# Patient Record
Sex: Female | Born: 1960 | Race: Black or African American | Hispanic: No | Marital: Single | Smoking: Never smoker
Health system: Southern US, Community
[De-identification: ages and names within clinical notes are randomized; demographics above are authoritative.]

## PROBLEM LIST (undated history)

## (undated) DIAGNOSIS — G4700 Insomnia, unspecified: Secondary | ICD-10-CM

## (undated) DIAGNOSIS — K5909 Other constipation: Secondary | ICD-10-CM

## (undated) DIAGNOSIS — E785 Hyperlipidemia, unspecified: Secondary | ICD-10-CM

## (undated) DIAGNOSIS — F419 Anxiety disorder, unspecified: Secondary | ICD-10-CM

## (undated) DIAGNOSIS — M069 Rheumatoid arthritis, unspecified: Secondary | ICD-10-CM

## (undated) DIAGNOSIS — M810 Age-related osteoporosis without current pathological fracture: Secondary | ICD-10-CM

## (undated) DIAGNOSIS — K219 Gastro-esophageal reflux disease without esophagitis: Secondary | ICD-10-CM

## (undated) DIAGNOSIS — K649 Unspecified hemorrhoids: Secondary | ICD-10-CM

## (undated) HISTORY — PX: OTHER SURGICAL HISTORY: SHX169

## (undated) HISTORY — PX: ABDOMINAL HYSTERECTOMY: SHX81

## (undated) HISTORY — DX: Unspecified hemorrhoids: K64.9

## (undated) HISTORY — DX: Insomnia, unspecified: G47.00

## (undated) HISTORY — DX: Anxiety disorder, unspecified: F41.9

## (undated) HISTORY — DX: Hyperlipidemia, unspecified: E78.5

## (undated) HISTORY — PX: KNEE ARTHROSCOPY: SUR90

## (undated) HISTORY — DX: Other constipation: K59.09

## (undated) HISTORY — DX: Gastro-esophageal reflux disease without esophagitis: K21.9

---

## 2002-02-04 ENCOUNTER — Ambulatory Visit (HOSPITAL_COMMUNITY): Admission: RE | Admit: 2002-02-04 | Discharge: 2002-02-04 | Payer: Self-pay | Admitting: Internal Medicine

## 2002-03-13 ENCOUNTER — Ambulatory Visit (HOSPITAL_COMMUNITY): Admission: RE | Admit: 2002-03-13 | Discharge: 2002-03-13 | Payer: Self-pay | Admitting: Internal Medicine

## 2002-04-19 ENCOUNTER — Encounter (INDEPENDENT_AMBULATORY_CARE_PROVIDER_SITE_OTHER): Payer: Self-pay | Admitting: Internal Medicine

## 2002-04-19 ENCOUNTER — Ambulatory Visit (HOSPITAL_COMMUNITY): Admission: RE | Admit: 2002-04-19 | Discharge: 2002-04-19 | Payer: Self-pay | Admitting: Internal Medicine

## 2002-05-03 ENCOUNTER — Encounter: Payer: Self-pay | Admitting: Emergency Medicine

## 2002-05-03 ENCOUNTER — Emergency Department (HOSPITAL_COMMUNITY): Admission: EM | Admit: 2002-05-03 | Discharge: 2002-05-03 | Payer: Self-pay | Admitting: Emergency Medicine

## 2002-12-19 ENCOUNTER — Emergency Department (HOSPITAL_COMMUNITY): Admission: EM | Admit: 2002-12-19 | Discharge: 2002-12-19 | Payer: Self-pay | Admitting: Emergency Medicine

## 2002-12-26 ENCOUNTER — Ambulatory Visit (HOSPITAL_COMMUNITY): Admission: RE | Admit: 2002-12-26 | Discharge: 2002-12-26 | Payer: Self-pay | Admitting: Family Medicine

## 2003-02-20 ENCOUNTER — Ambulatory Visit (HOSPITAL_COMMUNITY): Admission: RE | Admit: 2003-02-20 | Discharge: 2003-02-20 | Payer: Self-pay | Admitting: Otolaryngology

## 2003-02-20 ENCOUNTER — Encounter: Payer: Self-pay | Admitting: Otolaryngology

## 2003-04-23 ENCOUNTER — Emergency Department (HOSPITAL_COMMUNITY): Admission: EM | Admit: 2003-04-23 | Discharge: 2003-04-23 | Payer: Self-pay | Admitting: Emergency Medicine

## 2003-11-09 ENCOUNTER — Inpatient Hospital Stay (HOSPITAL_COMMUNITY): Admission: EM | Admit: 2003-11-09 | Discharge: 2003-11-11 | Payer: Self-pay | Admitting: Emergency Medicine

## 2003-12-10 ENCOUNTER — Ambulatory Visit (HOSPITAL_COMMUNITY): Admission: RE | Admit: 2003-12-10 | Discharge: 2003-12-10 | Payer: Self-pay | Admitting: Internal Medicine

## 2003-12-15 ENCOUNTER — Ambulatory Visit (HOSPITAL_COMMUNITY): Admission: RE | Admit: 2003-12-15 | Discharge: 2003-12-15 | Payer: Self-pay | Admitting: Internal Medicine

## 2003-12-30 ENCOUNTER — Ambulatory Visit (HOSPITAL_COMMUNITY): Admission: RE | Admit: 2003-12-30 | Discharge: 2003-12-30 | Payer: Self-pay | Admitting: Pulmonary Disease

## 2003-12-31 ENCOUNTER — Ambulatory Visit (HOSPITAL_COMMUNITY): Admission: RE | Admit: 2003-12-31 | Discharge: 2003-12-31 | Payer: Self-pay | Admitting: Family Medicine

## 2004-05-07 ENCOUNTER — Ambulatory Visit (HOSPITAL_COMMUNITY): Admission: RE | Admit: 2004-05-07 | Discharge: 2004-05-07 | Payer: Self-pay | Admitting: Pulmonary Disease

## 2004-06-11 ENCOUNTER — Emergency Department (HOSPITAL_COMMUNITY): Admission: EM | Admit: 2004-06-11 | Discharge: 2004-06-11 | Payer: Self-pay | Admitting: Emergency Medicine

## 2004-12-05 DIAGNOSIS — K649 Unspecified hemorrhoids: Secondary | ICD-10-CM

## 2004-12-05 HISTORY — DX: Unspecified hemorrhoids: K64.9

## 2005-01-06 ENCOUNTER — Ambulatory Visit (HOSPITAL_COMMUNITY): Admission: RE | Admit: 2005-01-06 | Discharge: 2005-01-06 | Payer: Self-pay | Admitting: Pulmonary Disease

## 2005-01-13 ENCOUNTER — Ambulatory Visit: Payer: Self-pay | Admitting: Internal Medicine

## 2005-01-20 ENCOUNTER — Ambulatory Visit: Payer: Self-pay | Admitting: Internal Medicine

## 2005-01-20 ENCOUNTER — Ambulatory Visit (HOSPITAL_COMMUNITY): Admission: RE | Admit: 2005-01-20 | Discharge: 2005-01-20 | Payer: Self-pay | Admitting: Internal Medicine

## 2005-01-20 HISTORY — PX: ESOPHAGOGASTRODUODENOSCOPY: SHX1529

## 2005-05-31 ENCOUNTER — Emergency Department (HOSPITAL_COMMUNITY): Admission: EM | Admit: 2005-05-31 | Discharge: 2005-05-31 | Payer: Self-pay | Admitting: Emergency Medicine

## 2006-01-23 ENCOUNTER — Ambulatory Visit: Payer: Self-pay | Admitting: Orthopedic Surgery

## 2006-02-15 ENCOUNTER — Emergency Department (HOSPITAL_COMMUNITY): Admission: EM | Admit: 2006-02-15 | Discharge: 2006-02-15 | Payer: Self-pay | Admitting: Emergency Medicine

## 2006-02-20 ENCOUNTER — Ambulatory Visit: Payer: Self-pay | Admitting: Orthopedic Surgery

## 2006-04-20 ENCOUNTER — Ambulatory Visit: Payer: Self-pay | Admitting: Orthopedic Surgery

## 2006-05-04 ENCOUNTER — Ambulatory Visit (HOSPITAL_COMMUNITY): Admission: RE | Admit: 2006-05-04 | Discharge: 2006-05-04 | Payer: Self-pay | Admitting: Pulmonary Disease

## 2006-05-05 ENCOUNTER — Ambulatory Visit (HOSPITAL_COMMUNITY): Admission: RE | Admit: 2006-05-05 | Discharge: 2006-05-05 | Payer: Self-pay | Admitting: Orthopedic Surgery

## 2006-05-05 ENCOUNTER — Ambulatory Visit: Payer: Self-pay | Admitting: Orthopedic Surgery

## 2006-05-08 ENCOUNTER — Ambulatory Visit: Payer: Self-pay | Admitting: Orthopedic Surgery

## 2006-05-17 ENCOUNTER — Ambulatory Visit: Payer: Self-pay | Admitting: Orthopedic Surgery

## 2006-05-22 ENCOUNTER — Ambulatory Visit: Payer: Self-pay | Admitting: Orthopedic Surgery

## 2006-06-05 ENCOUNTER — Ambulatory Visit: Payer: Self-pay | Admitting: Orthopedic Surgery

## 2006-06-05 ENCOUNTER — Encounter (HOSPITAL_COMMUNITY): Admission: RE | Admit: 2006-06-05 | Discharge: 2006-07-05 | Payer: Self-pay | Admitting: Orthopedic Surgery

## 2006-06-19 ENCOUNTER — Ambulatory Visit: Payer: Self-pay | Admitting: Orthopedic Surgery

## 2006-07-07 ENCOUNTER — Ambulatory Visit: Payer: Self-pay | Admitting: Internal Medicine

## 2007-01-16 ENCOUNTER — Ambulatory Visit: Payer: Self-pay | Admitting: Internal Medicine

## 2007-01-20 ENCOUNTER — Emergency Department (HOSPITAL_COMMUNITY): Admission: EM | Admit: 2007-01-20 | Discharge: 2007-01-20 | Payer: Self-pay | Admitting: Emergency Medicine

## 2007-03-26 ENCOUNTER — Ambulatory Visit: Payer: Self-pay | Admitting: Orthopedic Surgery

## 2007-07-02 ENCOUNTER — Ambulatory Visit: Payer: Self-pay | Admitting: Orthopedic Surgery

## 2007-12-24 ENCOUNTER — Ambulatory Visit: Payer: Self-pay | Admitting: Gastroenterology

## 2008-05-12 ENCOUNTER — Ambulatory Visit: Payer: Self-pay | Admitting: Internal Medicine

## 2008-05-19 ENCOUNTER — Ambulatory Visit: Payer: Self-pay | Admitting: Internal Medicine

## 2008-05-19 ENCOUNTER — Ambulatory Visit (HOSPITAL_COMMUNITY): Admission: RE | Admit: 2008-05-19 | Discharge: 2008-05-19 | Payer: Self-pay | Admitting: Internal Medicine

## 2008-05-19 HISTORY — PX: COLONOSCOPY: SHX174

## 2008-06-13 ENCOUNTER — Ambulatory Visit: Payer: Self-pay | Admitting: Gastroenterology

## 2008-06-30 ENCOUNTER — Ambulatory Visit (HOSPITAL_COMMUNITY): Admission: RE | Admit: 2008-06-30 | Discharge: 2008-06-30 | Payer: Self-pay | Admitting: General Surgery

## 2008-06-30 ENCOUNTER — Encounter (INDEPENDENT_AMBULATORY_CARE_PROVIDER_SITE_OTHER): Payer: Self-pay | Admitting: General Surgery

## 2008-06-30 HISTORY — PX: HEMORRHOID SURGERY: SHX153

## 2008-08-13 DIAGNOSIS — R634 Abnormal weight loss: Secondary | ICD-10-CM

## 2008-08-13 DIAGNOSIS — K219 Gastro-esophageal reflux disease without esophagitis: Secondary | ICD-10-CM | POA: Insufficient documentation

## 2008-08-13 DIAGNOSIS — F489 Nonpsychotic mental disorder, unspecified: Secondary | ICD-10-CM | POA: Insufficient documentation

## 2008-08-13 DIAGNOSIS — R110 Nausea: Secondary | ICD-10-CM

## 2008-08-13 DIAGNOSIS — Z8719 Personal history of other diseases of the digestive system: Secondary | ICD-10-CM

## 2008-08-13 DIAGNOSIS — K649 Unspecified hemorrhoids: Secondary | ICD-10-CM | POA: Insufficient documentation

## 2008-08-13 DIAGNOSIS — F411 Generalized anxiety disorder: Secondary | ICD-10-CM | POA: Insufficient documentation

## 2008-08-13 DIAGNOSIS — R1310 Dysphagia, unspecified: Secondary | ICD-10-CM | POA: Insufficient documentation

## 2008-08-13 DIAGNOSIS — K5909 Other constipation: Secondary | ICD-10-CM | POA: Insufficient documentation

## 2008-12-23 ENCOUNTER — Emergency Department (HOSPITAL_COMMUNITY): Admission: EM | Admit: 2008-12-23 | Discharge: 2008-12-24 | Payer: Self-pay | Admitting: Emergency Medicine

## 2009-01-15 ENCOUNTER — Ambulatory Visit: Payer: Self-pay | Admitting: Orthopedic Surgery

## 2009-01-15 DIAGNOSIS — M1710 Unilateral primary osteoarthritis, unspecified knee: Secondary | ICD-10-CM

## 2009-01-15 DIAGNOSIS — M25469 Effusion, unspecified knee: Secondary | ICD-10-CM

## 2009-01-15 DIAGNOSIS — M25569 Pain in unspecified knee: Secondary | ICD-10-CM

## 2009-01-29 ENCOUNTER — Ambulatory Visit: Payer: Self-pay | Admitting: Orthopedic Surgery

## 2009-01-29 DIAGNOSIS — M5430 Sciatica, unspecified side: Secondary | ICD-10-CM

## 2009-01-29 DIAGNOSIS — M549 Dorsalgia, unspecified: Secondary | ICD-10-CM | POA: Insufficient documentation

## 2009-02-03 ENCOUNTER — Telehealth: Payer: Self-pay | Admitting: Orthopedic Surgery

## 2009-02-03 ENCOUNTER — Ambulatory Visit (HOSPITAL_COMMUNITY): Admission: RE | Admit: 2009-02-03 | Discharge: 2009-02-03 | Payer: Self-pay | Admitting: Internal Medicine

## 2009-02-04 ENCOUNTER — Encounter (HOSPITAL_COMMUNITY): Admission: RE | Admit: 2009-02-04 | Discharge: 2009-03-03 | Payer: Self-pay | Admitting: Orthopedic Surgery

## 2009-02-04 ENCOUNTER — Encounter: Payer: Self-pay | Admitting: Orthopedic Surgery

## 2009-02-09 ENCOUNTER — Ambulatory Visit: Payer: Self-pay | Admitting: Orthopedic Surgery

## 2009-02-09 DIAGNOSIS — S83259A Bucket-handle tear of lateral meniscus, current injury, unspecified knee, initial encounter: Secondary | ICD-10-CM | POA: Insufficient documentation

## 2009-02-19 ENCOUNTER — Encounter: Payer: Self-pay | Admitting: Orthopedic Surgery

## 2009-02-19 ENCOUNTER — Telehealth: Payer: Self-pay | Admitting: Orthopedic Surgery

## 2009-03-10 ENCOUNTER — Ambulatory Visit (HOSPITAL_COMMUNITY): Admission: RE | Admit: 2009-03-10 | Discharge: 2009-03-10 | Payer: Self-pay | Admitting: Family Medicine

## 2009-03-11 ENCOUNTER — Encounter: Payer: Self-pay | Admitting: Orthopedic Surgery

## 2009-03-13 ENCOUNTER — Ambulatory Visit (HOSPITAL_COMMUNITY): Admission: RE | Admit: 2009-03-13 | Discharge: 2009-03-13 | Payer: Self-pay | Admitting: Orthopedic Surgery

## 2009-03-13 ENCOUNTER — Ambulatory Visit: Payer: Self-pay | Admitting: Orthopedic Surgery

## 2009-03-17 ENCOUNTER — Ambulatory Visit: Payer: Self-pay | Admitting: Orthopedic Surgery

## 2009-03-23 ENCOUNTER — Encounter: Payer: Self-pay | Admitting: Orthopedic Surgery

## 2009-03-23 ENCOUNTER — Encounter (HOSPITAL_COMMUNITY): Admission: RE | Admit: 2009-03-23 | Discharge: 2009-05-05 | Payer: Self-pay | Admitting: Orthopedic Surgery

## 2009-03-27 ENCOUNTER — Ambulatory Visit: Payer: Self-pay | Admitting: Orthopedic Surgery

## 2009-04-08 ENCOUNTER — Encounter (INDEPENDENT_AMBULATORY_CARE_PROVIDER_SITE_OTHER): Payer: Self-pay | Admitting: *Deleted

## 2009-04-08 ENCOUNTER — Ambulatory Visit: Payer: Self-pay | Admitting: Orthopedic Surgery

## 2009-04-08 DIAGNOSIS — M2240 Chondromalacia patellae, unspecified knee: Secondary | ICD-10-CM

## 2009-04-23 ENCOUNTER — Telehealth: Payer: Self-pay | Admitting: Orthopedic Surgery

## 2009-04-27 ENCOUNTER — Encounter (INDEPENDENT_AMBULATORY_CARE_PROVIDER_SITE_OTHER): Payer: Self-pay | Admitting: *Deleted

## 2009-04-27 ENCOUNTER — Ambulatory Visit: Payer: Self-pay | Admitting: Orthopedic Surgery

## 2009-05-05 ENCOUNTER — Encounter: Payer: Self-pay | Admitting: Orthopedic Surgery

## 2009-05-06 ENCOUNTER — Ambulatory Visit: Payer: Self-pay | Admitting: Orthopedic Surgery

## 2009-05-06 ENCOUNTER — Encounter (INDEPENDENT_AMBULATORY_CARE_PROVIDER_SITE_OTHER): Payer: Self-pay | Admitting: *Deleted

## 2009-05-11 ENCOUNTER — Ambulatory Visit: Payer: Self-pay | Admitting: Orthopedic Surgery

## 2009-07-29 ENCOUNTER — Ambulatory Visit: Payer: Self-pay | Admitting: Orthopedic Surgery

## 2009-09-07 ENCOUNTER — Ambulatory Visit (HOSPITAL_COMMUNITY): Admission: RE | Admit: 2009-09-07 | Discharge: 2009-09-07 | Payer: Self-pay | Admitting: Pulmonary Disease

## 2009-09-18 ENCOUNTER — Ambulatory Visit (HOSPITAL_COMMUNITY): Admission: RE | Admit: 2009-09-18 | Discharge: 2009-09-18 | Payer: Self-pay | Admitting: Pulmonary Disease

## 2009-11-23 ENCOUNTER — Ambulatory Visit: Payer: Self-pay | Admitting: Orthopedic Surgery

## 2010-03-02 ENCOUNTER — Ambulatory Visit (HOSPITAL_COMMUNITY): Admission: RE | Admit: 2010-03-02 | Discharge: 2010-03-02 | Payer: Self-pay | Admitting: Family Medicine

## 2010-03-08 ENCOUNTER — Encounter (INDEPENDENT_AMBULATORY_CARE_PROVIDER_SITE_OTHER): Payer: Self-pay

## 2010-03-09 ENCOUNTER — Encounter (HOSPITAL_COMMUNITY): Admission: RE | Admit: 2010-03-09 | Discharge: 2010-04-08 | Payer: Self-pay | Admitting: Cardiology

## 2010-05-17 ENCOUNTER — Ambulatory Visit: Payer: Self-pay | Admitting: Orthopedic Surgery

## 2010-05-18 ENCOUNTER — Ambulatory Visit (HOSPITAL_COMMUNITY): Admission: RE | Admit: 2010-05-18 | Discharge: 2010-05-18 | Payer: Self-pay | Admitting: Orthopedic Surgery

## 2010-05-25 ENCOUNTER — Encounter: Payer: Self-pay | Admitting: Orthopedic Surgery

## 2010-05-31 ENCOUNTER — Ambulatory Visit: Payer: Self-pay | Admitting: Orthopedic Surgery

## 2010-06-02 ENCOUNTER — Encounter: Payer: Self-pay | Admitting: Orthopedic Surgery

## 2010-06-02 ENCOUNTER — Encounter (HOSPITAL_COMMUNITY): Admission: RE | Admit: 2010-06-02 | Discharge: 2010-07-02 | Payer: Self-pay | Admitting: Orthopedic Surgery

## 2010-06-15 ENCOUNTER — Encounter: Payer: Self-pay | Admitting: Orthopedic Surgery

## 2010-07-28 ENCOUNTER — Encounter: Payer: Self-pay | Admitting: Orthopedic Surgery

## 2010-10-12 ENCOUNTER — Ambulatory Visit (HOSPITAL_COMMUNITY): Admission: RE | Admit: 2010-10-12 | Discharge: 2010-10-12 | Payer: Self-pay | Admitting: Pulmonary Disease

## 2010-11-10 ENCOUNTER — Ambulatory Visit (HOSPITAL_COMMUNITY)
Admission: RE | Admit: 2010-11-10 | Discharge: 2010-11-10 | Payer: Self-pay | Source: Home / Self Care | Admitting: Internal Medicine

## 2010-12-13 ENCOUNTER — Ambulatory Visit: Admit: 2010-12-13 | Payer: Self-pay | Admitting: Gastroenterology

## 2011-01-04 NOTE — Assessment & Plan Note (Signed)
Summary: RE-CK/XRAY RT KNEE/UMR/CAF   Visit Type:  Follow-up  CC:  right knee.  History of Present Illness: I saw Nicole Gilbert in the office today for a 6 month followup visit.  She is a 50 years old woman with the complaint of:  right knee stffness after sitting   DOS March 13, 2009 RIGHT knee arthroscopy/OPERATIVE FINDINGS:  She did have a lateral meniscus tear and what looked like a discoid lateral meniscus.  She also had a grade 3 lesion of the lateral femoral condyle and a grade 2 lesion of patellofemoral joint with chondromalacia.    Medication Ibuprofen as needed, helps some.  c/o back painwith constant radiating RIGHT leg pain and RIGHT leg weakness   Allergies: No Known Drug Allergies  Past History:  Past Medical History: Last updated: 01/15/2009 anxiety  Past Surgical History: Last updated: 01/15/2009  C-SECTION X 2 HYSTERECTOMY HEMORRHOIDECTOMY carpal tunnel right hand  Social History: Last updated: 01/15/2009 Patient is divorced.  nursing assistant  Risk Factors: Caffeine Use: 2 (01/15/2009)  Risk Factors: Smoking Status: never (01/15/2009)  Review of Systems Gastrointestinal:  Denies diarrhea and constipation. Genitourinary:  Denies frequency, urgency, and difficulty urinating. Neurologic:  See HPI. Musculoskeletal:  See HPI.  Physical Exam  Additional Exam:  overall appearance is normal her body habitus is mesomorphic.  She is oriented x3 her mood and affect are normal.  Her gait and station are abnormal with abnormal pelvic heights.  Her knee looks pretty good white female no effusion minimal tenderness normal range of motion stability and strength are normal.  Skin incision from the arthroscopy showed normally  Normal pulse and temperature and no edema in her lower extremities RIGHT or LEFT.  Normal sensation in both lower extremities.  Normal deep tendon reflexes bilaterally.  Normal coordination balance.  Tenderness in the lower back and  also on the RIGHT gluteal region of the hip.  She has a mild positive straight leg raise in a seated position.     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  Recommend Synvisc injections in the RIGHT knee after she has read the [employer] supplementation handout.  Recommend MRI lumbar spine to evaluate radicular symptoms.  She can start some Neurontin at night.  Orders: Est. Patient Level IV (09811)  Problem # 2:  BUCKET HANDLE TEAR OF LATERAL MENISCUS (ICD-717.41) Assessment: Unchanged  Orders: Est. Patient Level IV (91478)  Problem # 3:  SCIATICA (ICD-724.3) Assessment: Deteriorated  Orders: Est. Patient Level IV (29562) Lumbosacral Spine ,2/3 views (72100)  Problem # 4:  BACK PAIN (ICD-724.5) Assessment: Deteriorated  Orders: Est. Patient Level IV (13086) Lumbosacral Spine ,2/3 views (72100)  Problem # 5:  KNEE, ARTHRITIS, DEGEN./OSTEO (VHQ-469.62) Assessment: Unchanged  Orders: Est. Patient Level IV (95284)  Patient Instructions: 1)  Synvisc injection right knee  2)  MRI L spine 3)  Rec Neurontin 100 mg hs for right leg pain  4)  f/u after MRI

## 2011-01-04 NOTE — Letter (Signed)
Summary: MRI Order L spine  MRI Order L spine   Imported By: Cammie Sickle 05/17/2010 09:48:58  _____________________________________________________________________  External Attachment:    Type:   Image     Comment:   External Document

## 2011-01-04 NOTE — Assessment & Plan Note (Signed)
Summary: RE-CK/FOL/UP MRI RESULTS L-SPINE/UMR/CAF   Visit Type:  Follow-up  CC:  low back pain.  History of Present Illness: 50 year old female had back pain after knee surgery so we got an MRI because of persistent symptoms  Today for MRI results from 05/18/10 [facility] L spine.  Neurontin 100 at hs for back, helps some.  Has been out of work for a week.  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  No nerve root compression.  MRI shows no operable disease or need for epidurals.  I think she needs a good physical therapy program    Allergies: No Known Drug Allergies   Impression & Recommendations:  Problem # 1:  SCIATICA (ICD-724.3) Assessment Improved  back pain improve after not being at work   Orders: Est. Patient Level II (14782)  Problem # 2:  BACK PAIN (ICD-724.5) Assessment: Improved  Orders: Est. Patient Level II (95621)  Patient Instructions: 1)  PT x 6 weeks [or stop when comfortable on your own]  2)  return in 8 weeks

## 2011-01-04 NOTE — Miscellaneous (Signed)
Summary: Physical therapy order  Physical therapy order   Imported By: Cammie Sickle 06/10/2010 18:44:57  _____________________________________________________________________  External Attachment:    Type:   Image     Comment:   External Document

## 2011-01-04 NOTE — Medication Information (Signed)
Summary: Tax adviser   Imported By: Cammie Sickle 05/25/2010 07:17:14  _____________________________________________________________________  External Attachment:    Type:   Image     Comment:   External Document

## 2011-01-04 NOTE — Miscellaneous (Signed)
Summary: PT progress note  PT progress note   Imported By: Jacklynn Ganong 06/21/2010 08:25:34  _____________________________________________________________________  External Attachment:    Type:   Image     Comment:   External Document

## 2011-01-04 NOTE — Letter (Signed)
Summary: *Orthopedic No Show Letter  [facility]  [address] [place] [address]  [place], [place] [address]   Phone: [phone]  Fax: [phone]     07/28/2010   Nicole Gilbert [address] [place], [place]  [address]     Dear [name],   Our records indicate that you missed your scheduled appointment with Dr. Beaulah Corin on 07/26/10.  Please contact this office to reschedule your appointment as soon as possible.  It is important that you keep your scheduled appointments with your physician, so we can provide you the best care possible.        Sincerely,    Dr. Terrance Mass, MD [facility] Phone [phone]

## 2011-01-04 NOTE — Letter (Signed)
Summary: Recall, Screening Colonoscopy Only  [facility]  [address]   [place], [place] [address]   Phone: [phone]  Fax: [phone]    March 08, 2010  Nicole Gilbert [address] [place], [place]  [address] [birth year]   Dear [name],   Our records indicate it is time to schedule your colonoscopy. Please call our office @ [phone] and ask to speak to the triage nurse.   Thank you,    Hendricks Limes, LPN Cloria Spring, LPN  [facility] Ph: [phone]   Fax: [phone]

## 2011-01-04 NOTE — Miscellaneous (Signed)
Summary: PT clinical evaluation  PT clinical evaluation   Imported By: Jacklynn Ganong 06/11/2010 08:18:44  _____________________________________________________________________  External Attachment:    Type:   Image     Comment:   External Document

## 2011-03-18 ENCOUNTER — Encounter: Payer: Self-pay | Admitting: Orthopedic Surgery

## 2011-04-06 ENCOUNTER — Encounter: Payer: Self-pay | Admitting: Orthopedic Surgery

## 2011-04-06 ENCOUNTER — Ambulatory Visit (INDEPENDENT_AMBULATORY_CARE_PROVIDER_SITE_OTHER): Payer: 59 | Admitting: Orthopedic Surgery

## 2011-04-06 DIAGNOSIS — M79609 Pain in unspecified limb: Secondary | ICD-10-CM

## 2011-04-06 DIAGNOSIS — M79606 Pain in leg, unspecified: Secondary | ICD-10-CM

## 2011-04-06 MED ORDER — PREDNISONE 10 MG PO KIT: 10.0000 mg | PACK | ORAL | Status: DC

## 2011-04-06 MED ORDER — GABAPENTIN 100 MG PO CAPS: ORAL_CAPSULE | ORAL | Status: DC

## 2011-04-06 NOTE — Patient Instructions (Addendum)
Start new meds  Restart PT  Come back in 6 weeks  OOW starting today, RTW 04/18/11

## 2011-04-06 NOTE — Progress Notes (Signed)
50 year old female, status post RIGHT knee arthroscopy in 2010% with RIGHT leg pain associated with back pain. We worked her up with an MRI back at that time, which showed no compressive disease. She was treated with physical therapy, and Neurontin.  She still taking some Neurontin at this time. She is only taking it at certain intervals. When she is really having a bad day at work. 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  Her pain is from her back and it radiates down into her knee and then down the lateral side of her leg associated with numbness and tingling in her foot. The knee feels weak at times, if it's going to give way. By the end of the day or shift. She is having increased back pain.  She was taken off her ibuprofen by her neurologist in an attempt to solve her migraine headache issues.  Physical exam appearance is normal. She is oriented x3. Mood and affect are normal. Gait and station are normal.  She is normal. Knee flexion. Her knee is stable. He has no joint line tenderness. Manual muscle testing is normal. There is no joint swelling. Skin is intact.  She is tender in this lower back at L4-L5 and L5-S1 junction. She does not have a lot of gluteal tenderness. She has a Positive straight leg raise at 45 Lasegue's maneuver is positive as well. Opposite leg. Straight leg raise is negative for RIGHT leg pain.  She has decreased sensation in the dorsum of the RIGHT foot and tenderness along the lateral lower leg.  Impression sciatica.  Recommendation Increased Neurontin. Dosepak. Physical therapy. Follow up 6 weeks

## 2011-04-13 ENCOUNTER — Ambulatory Visit (HOSPITAL_COMMUNITY)
Admission: RE | Admit: 2011-04-13 | Discharge: 2011-04-13 | Disposition: A | Discharge status: Home / Self Care | Payer: 59 | Source: Ambulatory Visit | Attending: Physical Therapy | Admitting: Physical Therapy

## 2011-04-13 DIAGNOSIS — M545 Low back pain, unspecified: Secondary | ICD-10-CM | POA: Insufficient documentation

## 2011-04-13 DIAGNOSIS — IMO0001 Reserved for inherently not codable concepts without codable children: Secondary | ICD-10-CM | POA: Insufficient documentation

## 2011-04-13 DIAGNOSIS — M25569 Pain in unspecified knee: Secondary | ICD-10-CM | POA: Insufficient documentation

## 2011-04-13 DIAGNOSIS — R262 Difficulty in walking, not elsewhere classified: Secondary | ICD-10-CM | POA: Insufficient documentation

## 2011-04-19 NOTE — H&P (Signed)
NAME:  Nicole Gilbert, Nicole Gilbert                 ACCOUNT NO.:  [id number]   MEDICAL RECORD NO.:  [id number]          PATIENT TYPE:  AMB   LOCATION:  DAY                           FACILITY:  [facility]   PHYSICIAN:  R. Roetta Sessions, M.D. DATE OF BIRTH:  06-17-1961   DATE OF ADMISSION:  DATE OF DISCHARGE:  LH                              HISTORY & PHYSICAL   PRIMARY CARE PHYSICIAN:  Edward L. Juanetta Gosling, MD.   CHIEF COMPLAINT:  Rectal bleeding.   HISTORY OF PRESENT ILLNESS:  Nicole Gilbert is a 50 year old African American  female who has had a history of chronic constipation and intermittent  rectal bleeding.  She has been on Metamucil and a fiber supplement as  well as MiraLax on a p.r.n. basis for her chronic constipation.  More  recently for the last 6 months, she has had bright red rectal bleeding  intermittently mostly associated with bowel movements.  She has tried  Dual Action Colon Cleanse recently.  She has been on Anusol  suppositories and is currently on them.  She has tried Colace stool  softeners and a previous course of Anusol suppositories.  Her bleeding  persists despite all of the above treatment.   FAMILY HISTORY:  She does have a family history of colon cancer in a  brother who died at age 47.  She is here for further evaluation.  She  denies any abdominal pain.   PAST MEDICAL AND SURGICAL HISTORY:  Last colonoscopy was January 20, 2005, by Dr. Jena Gauss.  She had engorged internal hemorrhoids and otherwise  normal rectum and colon.  She had an EGD at the same time, which showed  normal esophagus, which is empirically dilated with a 56-French Maloney  dilator and small hiatal hernia.  She has a history of anxiety,  neurosis, and chronic constipation.  She has had a C-section x2 and  hysterectomy.   CURRENT MEDICATIONS:  1. Xanax 0.5 mg p.r.n.  2. Multivitamin daily.  3. MiraLax 17 g daily p.r.n.  4. Vitamin B6 100 mg b.i.d.   ALLERGIES:  NO KNOWN DRUG ALLERGIES.   FAMILY HISTORY:   Positive for a brother who died of colon cancer at age  34.   SOCIAL HISTORY:  She is divorced, has 2 children.  She is a caretaker  and denies any tobacco, alcohol, or drug use.   REVIEW OF SYSTEMS:  See HPI, otherwise negative.   PHYSICAL EXAMINATION:  VITAL SIGNS:  Weight 158 pounds, height 61  inches, temperature 98.4, blood pressure 108/70, and pulse 88.  GENERAL:  Nicole Gilbert is a well-developed, well-nourished African American  female in no acute distress.  HEENT:  Sclerae clear, nonicteric, conjunctivae pink.  Oropharynx pink  and moist without any lesions.  NECK:  Supple without any mass or thyromegaly.  CHEST:  Heart regular rate and rhythm.  Normal S1 and S2, without  murmurs, clicks, rubs, or gallops.  LUNGS:  Clear to auscultation bilaterally.  ABDOMEN:  Positive bowel sounds x4.  No bruits auscultated.  Soft,  nontender, and nondistended.  No palpable mass or hepatosplenomegaly.  No rebound tenderness or guarding.  EXTREMITIES:  Without clubbing or edema bilaterally.  RECTAL:  Large external and internal hemorrhoids palpated.  She has a  what appears to be a large engorged hemorrhoid versus firm polypoid  lesion.  This is going to require further evaluation.   IMPRESSION:  Nicole Gilbert is a 50 year old female with intermittent rectal  bleeding with a strong family history of colon cancer and on last  colonoscopy over 3 years ago, she has failed standard hemorrhoid  treatment with pursue further evaluation to ensure that source of  bleeding is felt to be hemorrhoidal rather than polyp or colorectal  carcinoma.  If it does prove to be hemorrhoidal disease, we would  consider further surgical evaluation.   PLAN:  1. Colonoscopy with Dr. Jena Gauss in the near future to discuss procedure      includes benefits including but not limited to bleeding, infection,      perforation, drug reaction,  she agrees to plan and consent be      obtained.  2. She can complete course of Anusol  suppositories.      Lorenza Burton, N.P.      Jonathon Bellows, M.D.  Electronically Signed    KJ/MEDQ  D:  05/12/2008  T:  05/13/2008  Job:  [id number]   cc:   Ramon Dredge L. Juanetta Gosling, M.D.  Fax: [phone]

## 2011-04-19 NOTE — Assessment & Plan Note (Signed)
NAMEMarland Kitchen  Nicole Gilbert, Nicole Gilbert                  CHART#:  [id number]   DATE:  12/24/2007                       DOB:  [birth year]   CHIEF COMPLAINT:  Rectal bleeding.   SUBJECTIVE:  The patient is a 50 year old female.  She was last seen on  01/16/2007.  She has a history of chronic constipation and GERD.  She  tells me December 03, 2007 she developed a large volume bright red  bleeding.  She noticed a small amount the day prior after having a bowel  movement.  She tells me that on the 29th she had a bright red blood  which poured from her rectum.  This is not associated with a bowel  movement.  She had been working on a wedding, and had been doing a  significant amount of straining and heavy lifting.  She has a history of  chronic constipation.  She has been taking stool softener.  She  generally has a bowel movement every other day.  She denies any  proctitis or pruritus, denies any abdominal pain with the bleeding.  She  has previously been on Amitiza which caused loose stools and cramps.  She does seem to get good relief with MiraLax, but admits to not taking  it on a regular basis.  She is also using stool softeners p.r.n. at  home.  She admits to using fiber sparingly.  She had a colonoscopy by  Dr. Jena Gauss on 01/20/2005.  She was found to have engorged internal  hemorrhoids in an otherwise normal exam.  She had an EGD at the same  time and was dilated with a 56-French Maloney dilator empirically.  She  also was found to have a small hiatal hernia.   CURRENT MEDICATIONS:  See updated list from 12/24/2007.   ALLERGIES:  No known drug allergies.   OBJECTIVE:  VITAL SIGNS:  Weight 154 pounds, height 61 inches,  temperature 97.9, blood pressure 120/78 and pulse of 82.  GENERAL:  She is a well-developed, well-nourished female in no acute  distress.  HEENT:  Sclerae clear, non injected.  Conjunctivae pink.  Oropharynx  pink and moist without any lesions.  CHEST:  Heart regular rate and rhythm.   Normal S1, S2.  ABDOMEN:  Positive bowel sounds x4.  No bruits auscultated, soft,  nontender, nondistended.  No palpable masses or hepatosplenomegaly.  No  rebound, tenderness or guarding.  RECTAL:  She does have large external hemorrhoids which are protruding  as well as internal hemorrhoids palpable.  She has a significant amount  of hard stool in the vault precluding adequate rectal exam.   ASSESSMENT:  The patient is a 50 year old female.  She has a history of  chronic constipation and I suspect her hematochezia is due to her  significant hemorrhoidal disease found on exam.  She does have a family  history of colon cancer in a half brother; therefore, would recommend  high-risk evaluation if she continues to have unexplained bleeding.  Gastroesophageal reflux disease asymptomatic at this time.   PLAN:  1. Anusol HC suppositories 1 per rectum b.i.d. #20 with no refills.  2. I would like her to begin MiraLax 17 g every day.  3. Begin fiber supplement of choice.  I have given her samples of      Benefiber today.  4. Take  Colace 100 mg b.i.d.  5. I would like her to call with a progress report in 2 weeks.  If she      continues to have rectal bleeding, would pursue colonoscopy at that      time.  Otherwise, she is due for high-risk screening colonoscopy      given family history of colon cancer in February of 2011.       Lorenza Burton, N.P.  Electronically Signed     R. Roetta Sessions, M.D.  Electronically Signed    KJ/MEDQ  D:  12/24/2007  T:  12/24/2007  Job:  [id number]   cc:   Ramon Dredge L. Juanetta Gosling, M.D.

## 2011-04-19 NOTE — Op Note (Signed)
NAME:  Nicole Gilbert, Nicole Gilbert                 ACCOUNT NO.:  [id number]   MEDICAL RECORD NO.:  [id number]          PATIENT TYPE:  AMB   LOCATION:  DAY                           FACILITY:  [facility]   PHYSICIAN:  Dalia Heading, M.D.  DATE OF BIRTH:  03/10/1961   DATE OF PROCEDURE:  06/30/2008  DATE OF DISCHARGE:                               OPERATIVE REPORT   PREOPERATIVE DIAGNOSIS:  Bleeding internal hemorrhoids.   POSTOPERATIVE DIAGNOSIS:  Bleeding internal hemorrhoids, rectocele.   PROCEDURE:  Extensive hemorrhoidectomy.   SURGEON:  Dalia Heading, MD   ANESTHESIA:  General.   INDICATIONS:  The patient is a 50 year old black female who presents  with ongoing bleeding internal hemorrhoids.  She has undergone  colonoscopy in the past by Dr. Jena Gauss and has failed conservative  measures.  The risks and benefits of the procedure including bleeding,  infection, and recurrence of the hemorrhoidal disease were fully  explained to the patient, gave informed consent.   PROCEDURE NOTE:  The patient was placed in lithotomy position after  general anesthesia was administered.  The perineum was prepped and  draped using the usual sterile technique with ChloraPrep.  Surgical site  confirmation was performed.   The patient was noted to have bleeding hemorrhoids at the 7 and 5  o'clock positions.  In addition, she had an inflamed area at the 11  o'clock position.  She was also noted to have a rectocele, though it was  not critical at this time.  All 3 internal hemorrhoids were ligated and  divided using the LigaSure.  The specimens were sent to pathology for  further examination.  Sensorcaine 0.5% was instilled in the surrounding  perineum.  Surgicel and viscous Xylocaine packing was then placed.   All tape and needle counts were correct at the end of the procedure.  The patient was awakened and transferred to PACU in stable condition.   COMPLICATIONS:  None.   SPECIMEN:  Hemorrhoids.   ESTIMATED  BLOOD LOSS:  Minimal.      Dalia Heading, M.D.  Electronically Signed     MAJ/MEDQ  D:  06/30/2008  T:  07/01/2008  Job:  [id number]   cc:   Ramon Dredge L. Juanetta Gosling, M.D.  Fax: [phone]   R. Roetta Sessions, M.D.  [address]  [place]  [place] [address]

## 2011-04-19 NOTE — H&P (Signed)
NAME:  Nicole Gilbert, Nicole Gilbert                 ACCOUNT NO.:  [id number]   MEDICAL RECORD NO.:  [id number]          PATIENT TYPE:  AMB   LOCATION:  DAY                           FACILITY:  [facility]   PHYSICIAN:  Dalia Heading, M.D.  DATE OF BIRTH:  1961/04/20   DATE OF ADMISSION:  DATE OF DISCHARGE:  LH                              HISTORY & PHYSICAL   CHIEF COMPLAINT:  Bleeding hemorrhoids.   HISTORY OF PRESENT ILLNESS:  The patient is a 50 year old black female  who is referred for evaluation and treatment of bleeding hemorrhoids.  They have been present intermittently for some time now.  She has had  multiple colonoscopies by Dr. Jena Gauss for hematochezia, the last one 6  weeks ago.  Suppositories have not been helpful.  The blood turns the  toilet bowl red.  She denies any constipation or straining.   PAST MEDICAL HISTORY:  Unremarkable.   PAST SURGICAL HISTORY:  Unremarkable.   CURRENT MEDICATIONS:  Xanax and vitamin daily.   ALLERGIES:  No known drug allergies.   REVIEW OF SYSTEMS:  Noncontributory.   PHYSICAL EXAMINATION:  The patient is a well-developed, well-nourished  black female in no acute distress.  Lungs are clear to auscultation with  equal breath sounds bilaterally.  Heart examination reveals regular rate  and rhythm without S3, S4, or murmurs.  Rectal examination reveals  protruding internal hemorrhoids though minimal bleeding is noted.   IMPRESSION:  Bleeding internal hemorrhoids.   PLAN:  The patient is scheduled for hemorrhoidectomy on June 30, 2008.  The risks and benefits of the procedure including bleeding, infection,  and recurrence of the hemorrhoids were fully explained to the patient,  gave informed consent.      Dalia Heading, M.D.  Electronically Signed     MAJ/MEDQ  D:  06/26/2008  T:  06/27/2008  Job:  [id number]   cc:   Ramon Dredge L. Juanetta Gosling, M.D.  Fax: [phone]   R. Roetta Sessions, M.D.  [address]  [place]  [place] [address]   Short Stay, [facility]

## 2011-04-19 NOTE — Op Note (Signed)
NAME:  Nicole Gilbert, Nicole Gilbert                 ACCOUNT NO.:  [id number]   MEDICAL RECORD NO.:  [id number]          PATIENT TYPE:  AMB   LOCATION:  DAY                           FACILITY:  [facility]   PHYSICIAN:  Vickki Hearing, M.D.DATE OF BIRTH:  [birth year]   DATE OF PROCEDURE:  03/13/2009  DATE OF DISCHARGE:                               OPERATIVE REPORT   SURGERY INDICATION:  Pain in the right knee, failed nonoperative  treatment with torn lateral meniscus and 3-compartment arthritis.   HISTORY:  This is a 50 year old female with right knee pain with no  known injury.  She has had pain for 3-6 months.  She thinks this started  when she started playing on the wee and running.  She presented with  pain and swelling.  She began to have a painful gait and limp when she  ambulated.  She had treatment with ibuprofen and hydrocodone as well as  injection.  She failed this treatment, had an MRI that showed 3-  compartment disease with complex tear anterior horn lateral meniscus and  moderate size joint effusion with Baker cyst.  After discussion of  surgical options and nonsurgical options, she decided to proceed with an  arthroscopy of the right knee with partial lateral meniscectomy.   PREOPERATIVE DIAGNOSES:  Lateral meniscal tear, osteoarthritis, right  knee.   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   PROCEDURE:  Surgical arthroscopy right knee, partial lateral  meniscectomy, chondroplasty lateral femoral condyle and patella.   SURGEON:  Vickki Hearing, MD   ASSISTANTS:  None.   ANESTHETIC:  General.   OPERATIVE FINDINGS:  She did have a lateral meniscus tear and what  looked like a discoid lateral meniscus.  She also had a grade 3 lesion  of the lateral femoral condyle and a grade 2 lesion of patellofemoral  joint with chondromalacia.   SPECIMENS:  None.   BLOOD LOSS:  Zero.   COMPLICATIONS:  None.   COUNTS:  Correct.   DETAILS OF PROCEDURE:  Site marking and patient identification were done  in the preoperative area, the patient was taken to surgery.  She was  given a gram of Ancef.  She had general anesthetic.  She was supine on  the operating table.  Her right knee was prepped with DuraPrep and  draped sterilely, it was in an arthroscopic leg holder.   After initiation and completion of the time-out procedure, we proceeded  with arthroscopy of the right knee.   We made a lateral portal.  We introduced the scope.  We did a diagnostic  arthroscopy.  We made a medial inferior portal and performed a  chondroplasty of the patella to a stable base.  We then proceeded in the  lateral compartment to do a partial lateral meniscectomy.  We balanced  the meniscus with a straight shaver and 50-degree ArthroCare wand.  We  then irrigated the knee and checked the medial meniscus which was  intact.  The medial femoral condyle was normal.  The ACL was normal.  The lesion on the patella was a grade 2 lesion quite diffuse in the  median ridge and this involved approximately 50% of that.  On the  lateral side, it was a grade 3 lesion of the lateral femoral condyle  over the meniscus.   We closed the knee with 3-0 nylon suture.  We injected with 20 mL of  Marcaine and epinephrine.  We placed sterile bandage and cryo cuff.   The patient was discharged to home later today.  She went to recovery  room in stable condition.  She was full weightbearing.  She was  discharged with Vicodin for pain and Phenergan for nausea.  Her postop  visit set up for Tuesday.      Vickki Hearing, M.D.  Electronically Signed     SEH/MEDQ  D:  03/13/2009  T:  03/13/2009  Job:  [id number]

## 2011-04-19 NOTE — Assessment & Plan Note (Signed)
NAMEMarland Kitchen  Nicole Gilbert, Nicole Gilbert                  CHART#:  [id number]   DATE:  06/13/2008                       DOB:  [birth year]   PRIMARY GASTROENTEROLOGIST:  Jonathon Bellows, MD   CHIEF COMPLAINT:  Rectal bleeding.   PROBLEM LIST:  1. 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 Jena Gauss.  2. Chronic gastroesophageal reflux disease.  She had impaired      dilatation, was needed a 6-French Maloney dilator and small hiatal      hernia on EGD on January 20, 2005 by Dr. Jena Gauss.  3. Anxiety.  4. Chronic constipation.  5. Cesarean section x2.  6. Hysterectomy.   SUBJECTIVE:  Nicole Gilbert is a 50 year old African American female.  She  has been passing a significant amount of bright red blood with stooling  over the last 3 days.  She has been taking stool softeners.  She has a  history of some constipation, but more recently her stools have been  soft.  She is having upper 2 a day.  She denies any abdominal pain at  this time.  Denies any nausea or vomiting.  Denies any diarrhea.  She  was found to have hemorrhoids on her last colonoscopy and she was using  Anusol or something like this intermittently for several months now.   CURRENT MEDICATIONS:  See the list from 06/13/08.   ALLERGIES:  No known drug allergies.   OBJECTIVE:  VITAL SIGNS:  Weight 157 pounds, height 61-3/4 inches,  temperature 97.8, blood pressure 118/82, and pulse 72.  GENERAL:  She is a well-developed, well-nourished African American  female in no acute distress.  HEENT.  Sclerae clear and nonicteric.  Conjunctivae pink.  Oropharynx  pink and moist without any lesions.  CHEST:  Heart regular rate and rhythm.  Normal S1 and S2.  ABDOMEN:  Positive bowel sounds x4.  No bruits are auscultated.  Soft,  nontender, and nondistended without palpable mass or hepatosplenomegaly.  No rebound tenderness or guarding.  EXTREMITIES.  Without clubbing or edema.  RECTAL:   She does have problem with anal papilla as well as multiple  external hemorrhoids on external exam.  Internal exam was not performed.   ASSESSMENT:  Nicole Gilbert is a 50 year old female with hemorrhoids that  have not responded to medical therapy, therefore we will initiate  surgical consultation for further evaluation and management.   PLAN:  1. We will obtain an appointment with either Dr. Leticia Penna or Dr.      Lovell Sheehan.  2. We have gone over standard hemorrhoid precautions.       Lorenza Burton, N.P.  Electronically Signed     Kassie Mends, M.D.  Electronically Signed    KJ/MEDQ  D:  06/13/2008  T:  06/14/2008  Job:  [id number]   cc:   Tilford Pillar, MD  Dr. Lovell Sheehan  Dr. Juanetta Gosling

## 2011-04-19 NOTE — Op Note (Signed)
NAME:  Nicole Gilbert, Nicole Gilbert                 ACCOUNT NO.:  [id number]   MEDICAL RECORD NO.:  [id number]          PATIENT TYPE:  AMB   LOCATION:  DAY                           FACILITY:  [facility]   PHYSICIAN:  R. Roetta Sessions, M.D. DATE OF BIRTH:  [birth year]   DATE OF PROCEDURE:  05/19/2008  DATE OF DISCHARGE:                               OPERATIVE REPORT   INDICATIONS FOR PROCEDURE:  A 50 year old African American female with  positive family history of colon cancer in a first-degree relative at a  young age.  She has intermittent hematochezia.  She had essentially  negative colonoscopy aside from engorged hemorrhoids on January 20, 2005.  She has had recurrent symptoms.  Colonoscopy is now being done.  This approach has been discussed with the patient at length.  Potential  risks, benefits, alternatives, and limitations have been reviewed and  questions answered.  She is agreeable.  Please see documentation and  medical record.   PROCEDURE NOTE:  O2 saturation, blood pressure, pulse, and respirations  were monitored throughout the entirety procedures.   CONSCIOUS SEDATION:  Versed 3 mg and IV Demerol 75 mg IV in divided  doses.   INSTRUMENT:  Pentax video chip system.   FINDINGS:  Digital rectal examination revealed hemorrhoids.  Endoscopic  findings:  The prep was adequate.  Colon:  Colonic mucosa was surveyed  from the rectosigmoid junction through the left transverse, right colon  to the appendiceal orifice, ileocecal valve, and cecum.  These  structures well seen and photographed for the record.  From this level,  scope was slowly cautiously withdrawn.  All previously mentioned mucosal  surfaces were again seen.  The patient had diffuse pigmentation of the  colonic mucosa consistent with melanosis coli; however, the remainder  colonic mucosa appeared entirely normal.  The scope was pulled down into  the rectum where thorough examination of the rectal mucosa, including  retroflexed  view of the anal verge.  En face view of the anal canal  demonstrated prominent internal hemorrhoids and anal papilla.  The  patient tolerated the procedure well and was reacted in Endoscopy.   IMPRESSION:  1. Prominent internal hemorrhoids and anal papilla, otherwise normal-      appearing rectal mucosa (except pigmentation consistent melanosis      coli).  2. 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   RECOMMENDATIONS:  1. Continue MiraLax 17 g orally at bedtime, give back on Colace 100 mg      orally b.i.d., 2-week course of Canasa      1-g mesalamine suppositories 1 per rectum at bedtime.  2. Plan to see her back in 1 month.  If bleeding has not significantly      improved, we will send her for surgical management of hemorrhoidal      disease.      Jonathon Bellows, M.D.  Electronically Signed     RMR/MEDQ  D:  05/19/2008  T:  05/20/2008  Job:  [id number]   cc:   Ramon Dredge L. Juanetta Gosling, M.D.  Fax: [phone]

## 2011-04-20 ENCOUNTER — Ambulatory Visit (HOSPITAL_COMMUNITY)
Admission: RE | Admit: 2011-04-20 | Discharge: 2011-04-20 | Disposition: A | Discharge status: Home / Self Care | Payer: 59 | Source: Ambulatory Visit | Attending: Pulmonary Disease | Admitting: Pulmonary Disease

## 2011-04-22 ENCOUNTER — Ambulatory Visit (HOSPITAL_COMMUNITY)
Admission: RE | Admit: 2011-04-22 | Discharge: 2011-04-22 | Disposition: A | Discharge status: Home / Self Care | Payer: 59 | Source: Ambulatory Visit | Attending: Pulmonary Disease | Admitting: Pulmonary Disease

## 2011-04-22 NOTE — Discharge Summary (Signed)
NAME:  Nicole Gilbert, Nicole Gilbert                           ACCOUNT NO.:  [id number]   MEDICAL RECORD NO.:  [id number]                   PATIENT TYPE:  INP   LOCATION:  A309                                 FACILITY:  [facility]   PHYSICIAN:  Edward L. Juanetta Gosling, M.D.             DATE OF BIRTH:  [birth year]   DATE OF ADMISSION:  11/08/2003  DATE OF DISCHARGE:  11/11/2003                                 DISCHARGE SUMMARY   FINAL DISCHARGE DIAGNOSES:  1. Abdominal pain, probably related to gastroenteritis.  2. Anxiety.  3. Chronic constipation.  4. Chronic hoarseness.   HISTORY:  Nicole Gilbert is a 50 year old who has had a two-day history of  abdominal cramping, pain, and some nausea and vomiting.  This started about  24 hours prior to her coming to the emergency room.  She was seen in the  emergency room, evaluated, and admitted because we were not able to get  control of her abdominal pain.   PAST MEDICAL HISTORY:  Positive for:  1. Anxiety.  2. Chronic constipation.  3. Chronic hoarseness.   PHYSICAL EXAM:  GENERAL:  On admission showed a well-developed, well-  nourished female who appeared to be in moderate acute distress because of  pain in her abdomen.  VITAL SIGNS:  She was afebrile, blood pressure 105/74.  CHEST:  Clear.  Without wheezes, rales, or rhonchi.  HEART:  Regular.  Without murmur, gallop, or rub.  ABDOMEN:  Soft.  No masses are felt.  EXTREMITIES:  Showed no edema.   ASSESSMENT:  She had probable gastroenteritis.   HOSPITAL COURSE:  She was admitted and started on NG suction, IV fluids, and  improved.  She still had some discomfort but by the time of discharge was  much improved and ready to be discharged home.  She was discharged home in  improved condition.  She did have GI consultation, who felt that she could  have further workup if needed on an outpatient basis.  Told to take Lomotil  if needed for diarrhea, and she had __________ already at home if needed for  constipation.   She is to follow up in my office in about a month.     ___________________________________________                                         Oneal Deputy. Juanetta Gosling, M.D.   ELH/MEDQ  D:  11/11/2003  T:  11/12/2003  Job:  [id number]

## 2011-04-22 NOTE — H&P (Signed)
Nicole Gilbert, Nicole Gilbert                 ACCOUNT NO.:  [id number]   MEDICAL RECORD NO.:  [id number]          PATIENT TYPE:  OUT   LOCATION:  RAD                           FACILITY:  [facility]   PHYSICIAN:  Vickki Hearing, M.D.DATE OF BIRTH:  1961/07/18   DATE OF ADMISSION:  05/04/2006  DATE OF DISCHARGE:  LH                                HISTORY & PHYSICAL   CHIEF COMPLAINT:  Pain and paresthesias of the right upper extremity.   This is a 50 year old female with 9 months of numbness and tingling with  night pain of both upper extremities, right greater than left.  She tried  Tylenol for pain with no effect she had numbness involving the median  distribution with mild weakness.  She has a history of hobbies including  crocheting and this has been effected.  She tried to stop doing that and her  symptoms have continued.  She had injections, night splints, vitamin B6,  rest, activity modification, failed conservative treatment.  All 10 system  reviewed were normal.   PAST HISTORY:  NO KNOWN DRUG ALLERGIES,  acid reflux, two cesarean sections,  hysterectomy, takes estrogen and Xanax.   FAMILY HISTORY:  Heart disease, diabetes.   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  High school  education through grade 12.   CONSTITUTIONAL FINDINGS:  Weight 150, pulse 84, respiratory rate 18.   APPEARANCE:  Normal including development, nutrition, grooming, hygiene.  Body habitus medium.  CARDIOVASCULAR:  Peripheral pulses were normal.  No effusion, no temperature  changes, edema, swelling and tenderness.  No varicose veins.  Lymphatic  system was normal.  MUSCULOSKELETAL:  Gait and station noncontributory.  Hands and upper  extremity showed no swelling, no tenderness, no effusion.  Range of motion  and stability is normal.  Weakness of grip in bilateral hands.  SKIN:  Normal.   Coordination finger-to-nose normal.  Reflexes normal.  Sensation was  decreased median nerve  distribution to pinprick and sharp touch/  Vibration  not tested.  Proprioception normal.  Oriented x3, mood and affect normal.  Provocative tests for carpal tunnel including carpal tunnel compression test  and Phalen's test which were positive.   DIAGNOSIS:  Right carpal tunnel syndrome.   PLAN:  Right carpal tunnel release.      Vickki Hearing, M.D.  Electronically Signed     SEH/MEDQ  D:  05/04/2006  T:  05/05/2006  Job:  [id number]

## 2011-04-22 NOTE — Group Therapy Note (Signed)
NAME:  Nicole Gilbert, Nicole Gilbert                           ACCOUNT NO.:  [id number]   MEDICAL RECORD NO.:  [id number]                   PATIENT TYPE:  INP   LOCATION:  A309                                 FACILITY:  [facility]   PHYSICIAN:  Edward L. Juanetta Gosling, M.D.             DATE OF BIRTH:  06/30/1961   DATE OF PROCEDURE:  11/11/2003  DATE OF DISCHARGE:                                   PROGRESS NOTE   PROBLEM:  Abdominal pain, probably gastroenteritis.   SUBJECTIVE:  Nicole Gilbert says she is feeling better this morning.  Her NG tube  is out, she is overall improved.  She has no new complaints.   OBJECTIVE:  Her physical exam today shows that her temperature is 98.4,  pulse 76, respirations 20, blood pressure 105/75.  Her chest is clear  without wheezes, rales, or rhonchi.  Her heart is regular, her abdomen is  quite soft.   ASSESSMENT:  She is much improved.   PLAN:  I have asked for GI consultation for further evaluation.  It is  possible that she will be able to be discharged later today since she is  improved so much.  We will discuss with the GI team.      ___________________________________________                                            Oneal Deputy. Juanetta Gosling, M.D.   ELH/MEDQ  D:  11/11/2003  T:  11/11/2003  Job:  [id number]

## 2011-04-22 NOTE — Op Note (Signed)
NAME:  Nicole Gilbert, Nicole Gilbert                 ACCOUNT NO.:  [id number]   MEDICAL RECORD NO.:  [id number]          PATIENT TYPE:  AMB   LOCATION:  DAY                           FACILITY:  [facility]   PHYSICIAN:  R. Roetta Sessions, M.D. DATE OF BIRTH:  [birth year]   DATE OF PROCEDURE:  01/20/2005  DATE OF DISCHARGE:                                 OPERATIVE REPORT   PROCEDURE:  Esophagogastroduodenoscopy with Elease Hashimoto dilation, followed by  diagnostic colonoscopy.   INDICATION FOR PROCEDURE:  The patient is a 50 year old lady with recurrent  esophageal dysphagia.  She has GERD symptoms, has not been on any acid  suppression therapy recently.  Also having hematochezia in the setting of  constipation.  EGD and colonoscopy are now being done.  This approach has  been discussed with the patient at length, potential risks, benefits, and  alternatives have been reviewed, questions answered, and she is agreeable.  Please see documentation in the medical record.   PROCEDURE NOTE:  O2 saturation, blood pressure, pulse, and respiration were  monitored throughout the entirety of the procedure.   CONSCIOUS SEDATION:  Versed 3 mg IV, Demerol 75 mg in divided doses.   INSTRUMENT USED:  Olympus video chip system.   FINDINGS:  EGD:  Examination of the tubular esophagus revealed no mucosal  abnormalities.  The GE junction was easily traversed.   Stomach:  The gastric cavity was empty and insufflated well with air.  A  thorough examination of the gastric mucosa, including retroflexed view of  the proximal stomach and esophagogastric junction, demonstrated only a small  hiatal hernia.  Pylorus patent and easily traversed.  Examination of the  bulb and second portion revealed no abnormalities.   Therapeutic/diagnostic maneuvers performed:  A 56 French Maloney dilator was  passed to full insertion with good patient tolerance and without blood  return on the dilator.  A look back revealed no apparent complications  related to passage of the dilator.  The patient tolerated the procedure well  and was prepared for colonoscopy.   Digital exam revealed no abnormalities.  Endoscopic findings:  The prep was  adequate.   Rectum:  Examination of the rectal mucosa including retroflexed view of the  anal verge revealed somewhat engorged hemorrhoids only.   Colon:  The colonic mucosa was surveyed from the rectosigmoid junction  through the left, transverse and right colon to the area of the appendiceal  orifice, ileocecal valve and cecum.  These structures were well-seen and  photographed for the record.  From this level the scope was slowly  withdrawn, all previously-mentioned mucosal surfaces were again seen.  The  colonic mucosa appeared normal.  The patient tolerated the procedure well,  was reacted in endoscopy.   IMPRESSION:  Esophagogastroduodenoscopy:  1.  Normal-appearing esophagus, status post passage of a 56 [place] Maloney      dilator.  2.  Small hiatal hernia.  3.  Otherwise normal stomach, normal D1, D2.   Colonoscopy findings:  Engorged internal hemorrhoids, otherwise normal  rectum, normal colon.  I suspect the patient bled from hemorrhoids recently.  RECOMMENDATIONS:  1.  GERD/hemorrhoid and constipation literature provided to [name].  2.  Will get back on Aciphex 20 mg orally daily, a prescription given.  3.  Resume Miralax on a p.r.n. basis 17 g orally at bedtime.  4.  Anusol suppositories one per rectum at bedtime x10 days.  5.  Follow-up appointment with [place] in three months.      RMR/MEDQ  D:  01/20/2005  T:  01/20/2005  Job:  [id number]   cc:   Ramon Dredge L. Juanetta Gosling, M.D.  [address]  [place]  [place] [address]  Fax: [phone]

## 2011-04-22 NOTE — Consult Note (Signed)
NAME:  Nicole Gilbert, Nicole Gilbert                           ACCOUNT NO.:  [id number]   MEDICAL RECORD NO.:  [id number]                   PATIENT TYPE:  INP   LOCATION:  A309                                 FACILITY:  [facility]   PHYSICIAN:  Lionel December, M.D.                 DATE OF BIRTH:  1961-06-29   DATE OF CONSULTATION:  11/11/2003  DATE OF DISCHARGE:                                   CONSULTATION   GASTROINTESTINAL CONSULTATION   REASON FOR CONSULTATION:  Abdominal pain.   REQUESTING PHYSICIAN:  Edward L. Juanetta Gosling, M.D.   HISTORY OF PRESENT ILLNESS:  The patient is a 50 year old black female,  patient of Dr. Juanetta Gosling, who was admitted 2 days ago with acute onset of  diffuse abdominal cramping, associated with nausea and vomiting. Symptoms  presented approximately 24 hours prior to her coming to the emergency  department.  She had no diarrhea.  She had a normal bowel movement the day  symptoms began.  She denies any associated fever or chills or hematemesis.  She does have chronic constipation.  Generally she has 2 bowel movements  weekly.  She denies any melena or rectal bleeding.  No one else around her  has been sick.  She does not recall eating anything that she felt was  tainted.   In the emergency department her LFTs, amylase, lipase, and CBC were all  normal.  She had a CT of the abdomen and pelvis which revealed bilateral  basilar, 5 mm nodular densities, 2 on the left 1 on the right.  It was a  limited examination of the bowel given that there was limited contrast  filling especially of the small bowel loops.  Also, appendicitis cannot be  excluded.  She had acute abdominal series which was unremarkable.  Nausea  and vomiting resolved only after NG tube placement.  She since has had NG  tube removed, early this morning and has tolerated her breakfast.  She tells  me that she is feeling much better and denies any abdominal pain at this  time.   MEDICATIONS PRIOR TO ADMISSION:  1.  Xanax 0.25 mg daily.  2. Weight Smart 1 daily.  3. Premarin 1 daily.   ALLERGIES:  SEAFOOD.   PAST MEDICAL HISTORY:  1. Anxiety.  2. Chronic constipation.  3. 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   PAST SURGICAL HISTORY:  1. Two cesarean sections.  2. Hysterectomy.  3. Colonoscopy in March 2003 revealed internal hemorrhoids.  4. EGD in April 2003 revealed small hiatal hernia.   FAMILY HISTORY:  Family history of maternal grandfather at age 67 has cancer  of unknown etiology.  Her half-brother died of colon cancer.   SOCIAL HISTORY:  She is divorced.  She has 2 children.  She is a caretaker.  She denies any tobacco or alcohol use.   REVIEW OF SYSTEMS:  Please see  HPI for GI.  GENITOURINARY:  Denies any  dysuria or hematuria.   PHYSICAL EXAMINATION:  VITAL SIGNS:  Temperature 98.4, pulse 76,  respirations 20, blood pressure 105/74.  Height 5 feet 1 inch.  Weight  146.7.  GENERAL:  A pleasant, well-nourished, well-developed, black female in no  acute distress.  SKIN:  Warm and dry.  No jaundice.  HEENT:  Conjunctivae pink.  Sclerae anicteric. Oropharyngeal mucosa moist  and pink.  No lesions or exudate.  She has mild erythema of the posterior  pharynx.  NECK:  No lymphadenopathy or thyromegaly.  CHEST:  Lungs are clear to auscultation.  CARDIOVASCULAR:  Cardiac exam reveals regular rate and rhythm.  Normal S1,  S2.  No murmurs, rubs, or gallops.  ABDOMEN:  Positive bowel sounds, soft, nontender, nondistended.  No  organomegaly or masses.  EXTREMITIES:  No edema.   LABS:  As mentioned in HPI.  In addition on November 10, 2003 white count was  6, hemoglobin 11.7, platelets 284,000.  Sodium 139, potassium 3.9, BUN 14,  creatinine 0.8, glucose 165.   IMPRESSION:  The patient is a pleasant, 50 year old lady who developed acute  onset of abdominal crampiness associated with nausea and vomiting.  This is  possibly a signal to gastroenteritis.  Although  they cannot exclude a  partial bowel obstruction which has resolved on its own. This, however,  would be less likely given that she had an acute abdominal series, although  her CT revealed a limited examination due to a poor filling of the bowel  with contrast.   SUGGESTIONS:  I agree with her discharge.  If the patient has recurrent  symptoms she will follow up with [place] for further workup.  I would like to  thank Dr. Juanetta Gosling for allowing [place] to take part in the care of this patient.     ________________________________________  ___________________________________________  Tana Coast, P.A.                         Lionel December, M.D.   LL/MEDQ  D:  11/11/2003  T:  11/11/2003  Job:  [id number]   cc:   Ramon Dredge L. Juanetta Gosling, M.D.  [address]  [place]  [place] [address]  Fax: [phone]   Lionel December, M.D.  [address]  [place]  [place] [address]  Fax: [phone]

## 2011-04-22 NOTE — H&P (Signed)
NAME:  Nicole Gilbert, Nicole Gilbert                 ACCOUNT NO.:  [id number]   MEDICAL RECORD NO.:  [id number]           PATIENT TYPE:   LOCATION:                                 FACILITY:   PHYSICIAN:  R. Roetta Sessions, M.D. DATE OF BIRTH:  01-May-1961   DATE OF ADMISSION:  01/13/2005  DATE OF DISCHARGE:  LH                                HISTORY & PHYSICAL   CHIEF COMPLAINT:  Esophageal dysphagia, rectal bleeding.   Nicole Gilbert is a 50 year old African-American female with long-standing  constipation, intermittent hematochezia __________ previously.  Three years  ago she underwent a colonoscopy and was found to have only engorged internal  hemorrhoids in the setting of constipation.  She was given MiraLax and fiber  constipation.  She has run out of MiraLax and sites expense for not getting  it refilled.  Recently she has had some large volume hematochezia coloring  the toilet water red with some clots.  No associated abdominal pain.  She is  only taking a stool softener for bowel function.  She also has long-standing  gastroesophageal reflux disease symptoms.  Nexium was felt not to be  effective.  She stopped taking it.  She has also developed recurrent  esophageal dysphagia.  Back in 2003 she underwent an EGD which revealed no  structural abnormalities.  She responded nicely, however, to empiric passage  of a 54-French Maloney dilator.  She was admitted to the hospital in  December 2004 with nausea, vomiting, weight loss, left upper quadrant  abdominal pain.  She was felt to be constipated at that time.  Small bowel  follow through demonstrated questionable mild thickening of the proximal  small bowel, however, it was felt to be insignificant once reviewed with the  radiologist.  She was noted to be significantly constipated on scout films.  It is notable that she was last seen here on December 04, 2003.  She weighed  146 pounds.  Now she weighs 149.5 pounds.  She has not had any melena, no  odynophagia.  No recent nausea and vomiting.  She works the night shift at  the [employer].  She really has not much in the way of abdominal  pain recently.  It is notable that she has missed multiple appointments with  [place] December 17, 2004, December 14, 2003, January 12, 2004 during the past year.  Abdominal ultrasound back on December 15, 2003 demonstrated no abnormalities.   PAST MEDICAL HISTORY:  Anxiety neurosis.   She is on hormone replacement therapy.   FAMILY HISTORY:  Significant in that her brother died of colorectal  carcinoma at age 67.   She has seen Dr. Malvin Johns for internal hemorrhoids, but not felt to be  amenable to surgical therapy at the time she was seen.  She recently has  been treated for Strep throat with antibiotics by Dr. Juanetta Gosling.  She has had  intermittent hoarseness over the years.   PAST SURGICAL HISTORY:  1.  Cesarean section x2.  2.  Hysterectomy.   CURRENT MEDICATIONS:  1.  Premarin 0.625 mg daily.  2.  Xanax 0.5 mg daily.  3.  Dulcolax tablets occasionally.  4.  Multivitamin daily.   ALLERGIES:  No known drug allergies.   FAMILY HISTORY:  As outlined above.  Mother has a history of colonic polyps.   REVIEW OF SYSTEMS:  As in history of present illness.  No chest pain,  dyspnea on exertion.  No fever, chills.   PHYSICAL EXAMINATION:  GENERAL:  Pleasant 50 year old lady resting  comfortably.  VITAL SIGNS:  Weight 149.5, height 5 feet 1 inch, blood pressure 112/64,  pulse 72.  SKIN:  Warm and dry.  HEENT:  No scleral icterus. JVD is not prominent.  CHEST:  Lungs are clear to auscultation.  CARDIAC:  Regular rate and rhythm without murmurs, rubs, or gallops.  ABDOMEN:  Nondistended.  Positive bowel sounds, soft, nondistended without  appreciable mass or organomegaly.  EXTREMITIES:  No edema.   IMPRESSION:  Nicole Gilbert is a pleasant 50 year old lady with esophageal  dysphagia, recurrent gastroesophageal reflux disease symptoms  without acid  suppression therapy.  Concerned about her hematochezia.  It is good to know  she had a colonoscopy in 2003.  However, she has had recurrent bleeding and  has a positive family history in colon cancer at an early age in a first  degree relative.   RECOMMENDATIONS:  Would offer Nicole Gilbert both an EGD with possible esophageal  dilation and a colonoscopy in the very near future.  Potential risks,  benefits, and alternatives have been reviewed, questions answered.  She is  agreeable.  Will go ahead and give her some samples of Aciphex 20 mg orally  daily to be started immediately.  Probably need to get back on MiraLax  depending on what is found at colonoscopy.  Further recommendations to  follow.      RMR/MEDQ  D:  01/13/2005  T:  01/13/2005  Job:  [id number]   cc:   Ramon Dredge L. Juanetta Gosling, M.D.  [address]  [place]  [place] [address]  Fax: [phone]

## 2011-04-22 NOTE — Op Note (Signed)
[name], Nicole Gilbert                 ACCOUNT NO.:  [id number]   MEDICAL RECORD NO.:  [id number]          PATIENT TYPE:  AMB   LOCATION:  DAY                           FACILITY:  [facility]   PHYSICIAN:  Vickki Hearing, M.D.DATE OF BIRTH:  12-Jun-1961   DATE OF PROCEDURE:  05/05/2006  DATE OF DISCHARGE:  05/05/2006                                 OPERATIVE REPORT   PREOPERATIVE DIAGNOSIS:  Carpal tunnel syndrome, right wrist.   POSTOPERATIVE DIAGNOSIS:  Carpal tunnel syndrome, right wrist.   OPERATION PERFORMED:  Open right carpal tunnel release.   SURGEON:  Vickki Hearing, M.D.   OPERATIVE FINDINGS:  The nerve actually looked pretty good.  There may have  been a little stenosis just underneath the __________ has had over six  months of pain and paresthesias in the right upper extremity consistent with  carpal tunnel syndrome.  She did receive injections, vitamin B6, anti-  inflammatory conservative treatment and due to persistent pain and  paresthesias including nocturnal symptoms, she presented for surgery.   The patient had surgery under a Bier block.  We did the procedure as  follows.   DESCRIPTION OF PROCEDURE:  She was identified in the preop holding area as  [name].  She marked her right wrist as the surgical site and was  countersigned by me the surgeon.  Her history and physical was updated.  She  was started on an antibiotic and taken to the operating room where a Bier  block was done.  After successful Bier block, she was prepped and draped  using sterile technique and the time out procedure was completed and site  verification was __________  tissue identified the palmar fascia, bluntly  dissected it until we saw the distal end of the transverse carpal ligament.  We performed blunt dissection beneath the ligament and with sharp dissection  released the ligament.  We identified the carpal tunnel contents, found them  to be normal except for the stenosis around  the median nerve and irrigated  and closed with 10 mL of plain Marcaine on the radial side of the incision.  We placed a sterile dressing, released the tourniquet, the fingers were  viable and the patient was taken to the recovery room in stable condition.  The postoperative plan is for active range of motion, dressing change on  Monday, keep hand elevated.  Will change dressing.      Vickki Hearing, M.D.  Electronically Signed     SEH/MEDQ  D:  05/05/2006  T:  05/05/2006  Job:  [id number]

## 2011-04-22 NOTE — H&P (Signed)
NAME:  Nicole Gilbert, Nicole Gilbert                           ACCOUNT NO.:  [id number]   MEDICAL RECORD NO.:  [id number]                   PATIENT TYPE:  INP   LOCATION:  A309                                 FACILITY:  [facility]   PHYSICIAN:  Edward L. Juanetta Gosling, M.D.             DATE OF BIRTH:  08/14/1961   DATE OF ADMISSION:  11/08/2003  DATE OF DISCHARGE:                                HISTORY & PHYSICAL   REASON FOR ADMISSION:  Abdominal pain.   SUBJECTIVE:  Ms. Kisamore came to the emergency room on the morning of  admission and had a number of studies done including a flat and upright  abdominal film, CT of the abdomen, laboratory work, etc., none of which  showed anything, but she continued to have severe abdominal pain which is  cramping and diffuse and had severe nausea and vomiting which did not  resolve until she had an NG tube placed.  Because of the inability to stop  her symptoms she was admitted to the hospital.  She said that she got sick  about 24 hours prior to admission.  She did not have any definite fever or  chills at home.  She has no chronic medical problems except for anxiety, for  which she takes Xanax, and that is her only prescription medication.  She  does not know of anyone else that she has been around who has been sick.   PAST MEDICAL HISTORY:  As mentioned above.   FAMILY HISTORY:  No known history of any sort of gastrointestinal bleed  problems.  Her mother is still living and in good health.   SOCIAL HISTORY:  She works as a Facilities manager of the elderly.  She does not  smoke.  She does not drink any alcohol.   REVIEW OF SYSTEMS:  Except as mentioned is that she has not had any  diarrhea.   PHYSICAL EXAM:  VITAL SIGNS:  Temperature 97.4, blood pressure 139/78, pulse  92, respirations 22.  GENERAL:  She has an NG tube in place.  HEENT:  Her nose and throat are clear.  Her mucous membranes are slightly  dry.  CHEST:  Shows rhonchi bilaterally.  HEART:  Regular, without  gallop.  ABDOMEN:  Somewhat protuberant.  Mildly tender diffusely.  EXTREMITIES:  Showed no edema.  CNS:  Grossly intact.   LABORATORY WORK:  Flat and upright abdominal film did not show any evidence  of obstruction.   Her urinalysis is negative.  White count 7000, hemoglobin 13.  Electrolytes  are normal:  BUN 12, creatinine 0.9.  Liver function was normal.  Amylase  118 normal, lipase 41 normal.   ASSESSMENT:  She has normal laboratory work but severe abdominal pain.   PLAN:  To continue with NG suction.  Give her IV fluids, pain medications,  nausea medications, and will repeat laboratories, etc., tomorrow.     ___________________________________________  Edward L. Juanetta Gosling, M.D.   ELH/MEDQ  D:  11/09/2003  T:  11/09/2003  Job:  [id number]

## 2011-04-26 ENCOUNTER — Ambulatory Visit (HOSPITAL_COMMUNITY)
Admission: RE | Admit: 2011-04-26 | Discharge: 2011-04-26 | Disposition: A | Discharge status: Home / Self Care | Payer: 59 | Source: Ambulatory Visit | Attending: Pulmonary Disease | Admitting: Pulmonary Disease

## 2011-04-28 ENCOUNTER — Ambulatory Visit (HOSPITAL_COMMUNITY)
Admission: RE | Admit: 2011-04-28 | Discharge: 2011-04-28 | Disposition: A | Discharge status: Home / Self Care | Payer: 59 | Source: Ambulatory Visit | Attending: Pulmonary Disease | Admitting: Pulmonary Disease

## 2011-05-03 ENCOUNTER — Ambulatory Visit (HOSPITAL_COMMUNITY)
Admission: RE | Admit: 2011-05-03 | Discharge: 2011-05-03 | Disposition: A | Discharge status: Home / Self Care | Payer: 59 | Source: Ambulatory Visit | Attending: Pulmonary Disease | Admitting: Pulmonary Disease

## 2011-05-05 ENCOUNTER — Other Ambulatory Visit: Payer: Self-pay | Admitting: *Deleted

## 2011-05-05 ENCOUNTER — Ambulatory Visit (HOSPITAL_COMMUNITY)
Admission: RE | Admit: 2011-05-05 | Discharge: 2011-05-05 | Disposition: A | Discharge status: Home / Self Care | Payer: 59 | Source: Ambulatory Visit | Attending: Pulmonary Disease | Admitting: Pulmonary Disease

## 2011-05-05 DIAGNOSIS — M1990 Unspecified osteoarthritis, unspecified site: Secondary | ICD-10-CM

## 2011-05-05 DIAGNOSIS — IMO0001 Reserved for inherently not codable concepts without codable children: Secondary | ICD-10-CM | POA: Insufficient documentation

## 2011-05-05 DIAGNOSIS — M25569 Pain in unspecified knee: Secondary | ICD-10-CM | POA: Insufficient documentation

## 2011-05-05 DIAGNOSIS — M545 Low back pain, unspecified: Secondary | ICD-10-CM | POA: Insufficient documentation

## 2011-05-05 DIAGNOSIS — R262 Difficulty in walking, not elsewhere classified: Secondary | ICD-10-CM | POA: Insufficient documentation

## 2011-05-05 MED ORDER — NABUMETONE 500 MG PO TABS: 500.0000 mg | ORAL_TABLET | Freq: Two times a day (BID) | ORAL | Status: AC

## 2011-05-05 MED ORDER — NABUMETONE 500 MG PO TABS: 500.0000 mg | ORAL_TABLET | Freq: Two times a day (BID) | ORAL | Status: DC

## 2011-05-06 ENCOUNTER — Telehealth: Payer: Self-pay | Admitting: Orthopedic Surgery

## 2011-05-06 NOTE — Telephone Encounter (Signed)
Patient states she has been doing physical therapy as ordered for her back at [facility].  She states her RT knee is swelling and hurting also, and that therapist Misty Stanley "is trying to figure out if the problem is in her knee or her back."  Patient refilled her medication per recent order and has been icing knee. Her next scheduled appointment is 05/19/11. She states she is returning to work after a Barr[employer]'s [employer] vacation and is concerned.   Please advise. Her cell # is [phone].

## 2011-05-09 ENCOUNTER — Other Ambulatory Visit: Payer: Self-pay | Admitting: Orthopedic Surgery

## 2011-05-09 DIAGNOSIS — M79606 Pain in leg, unspecified: Secondary | ICD-10-CM

## 2011-05-09 MED ORDER — HYDROCODONE-ACETAMINOPHEN 5-325 MG PO TABS: 1.0000 | ORAL_TABLET | ORAL | Status: DC | PRN

## 2011-05-09 NOTE — Telephone Encounter (Signed)
Advised to rest her leg ice, use ace wrap, take Relafen bid and Norco 5 as needed, this is for knee pain and swelling, she stated she had to work tonight advised to use ace wrap and ice as much as possible, also has pain going to foot, I advised this is coming from her back, she is in PT for her back

## 2011-05-10 ENCOUNTER — Ambulatory Visit (HOSPITAL_COMMUNITY)
Admission: RE | Admit: 2011-05-10 | Discharge: 2011-05-10 | Disposition: A | Discharge status: Home / Self Care | Payer: 59 | Source: Ambulatory Visit | Attending: Pulmonary Disease | Admitting: Pulmonary Disease

## 2011-05-10 DIAGNOSIS — IMO0001 Reserved for inherently not codable concepts without codable children: Secondary | ICD-10-CM | POA: Insufficient documentation

## 2011-05-10 DIAGNOSIS — M25569 Pain in unspecified knee: Secondary | ICD-10-CM | POA: Insufficient documentation

## 2011-05-10 DIAGNOSIS — M545 Low back pain, unspecified: Secondary | ICD-10-CM | POA: Insufficient documentation

## 2011-05-10 DIAGNOSIS — R262 Difficulty in walking, not elsewhere classified: Secondary | ICD-10-CM | POA: Insufficient documentation

## 2011-05-18 ENCOUNTER — Ambulatory Visit: Payer: 59 | Admitting: Orthopedic Surgery

## 2011-05-19 ENCOUNTER — Ambulatory Visit (INDEPENDENT_AMBULATORY_CARE_PROVIDER_SITE_OTHER): Payer: 59 | Admitting: Orthopedic Surgery

## 2011-05-19 DIAGNOSIS — M549 Dorsalgia, unspecified: Secondary | ICD-10-CM

## 2011-05-19 NOTE — Progress Notes (Signed)
50 year old female, status post RIGHT knee arthroscopy in 2010% with RIGHT leg pain associated with back pain. We worked her up with an MRI back at that time, which showed no compressive disease. She was treated with physical therapy, and Neurontin.  Status post physical therapy for her lumbar spine and RIGHT leg radicular pain.  Still having RIGHT lower extremity pain, numbness, and the top of the foot, lateral knee pain, intermittent knee swelling.  MRI was done last year.  Recommend L4-L5 epidural injection for root radiculopathy of L5.  Diagnosis sciatica

## 2011-05-19 NOTE — Patient Instructions (Addendum)
Go for ESI's in L spine try Bethea  Increase Neurontin to three tablets before bed  Come back after 2nd injection

## 2011-05-20 ENCOUNTER — Telehealth: Payer: Self-pay | Admitting: Radiology

## 2011-05-20 NOTE — Telephone Encounter (Signed)
I faxed a referral for this patient to Dr. Eduard Clos for ESI injections at level L4-L5.

## 2011-05-24 ENCOUNTER — Telehealth: Payer: Self-pay | Admitting: Radiology

## 2011-05-24 ENCOUNTER — Emergency Department (HOSPITAL_COMMUNITY)
Admission: EM | Admit: 2011-05-24 | Discharge: 2011-05-24 | Disposition: A | Discharge status: Home / Self Care | Payer: 59 | Attending: Emergency Medicine | Admitting: Emergency Medicine

## 2011-05-24 DIAGNOSIS — H6690 Otitis media, unspecified, unspecified ear: Secondary | ICD-10-CM | POA: Insufficient documentation

## 2011-05-24 DIAGNOSIS — J029 Acute pharyngitis, unspecified: Secondary | ICD-10-CM | POA: Insufficient documentation

## 2011-05-24 LAB — RAPID STREP SCREEN (MED CTR MEBANE ONLY): Streptococcus, Group A Screen (Direct): NEGATIVE

## 2011-05-24 NOTE — Telephone Encounter (Signed)
Patient has an appointment with Dr. Eduard Clos on 05-25-11. Patient is aware of her appointment.

## 2011-06-21 NOTE — Telephone Encounter (Signed)
Done

## 2011-07-05 ENCOUNTER — Ambulatory Visit (INDEPENDENT_AMBULATORY_CARE_PROVIDER_SITE_OTHER): Payer: 59 | Admitting: Orthopedic Surgery

## 2011-07-05 DIAGNOSIS — M5430 Sciatica, unspecified side: Secondary | ICD-10-CM

## 2011-07-05 NOTE — Progress Notes (Signed)
RIGHT knee scope.  Epidural injection at L4-L5 by Dr. Waynetta Sandy A.  Doing well with the injection helped a lot. Continue with progression towards routine exercise such as Walking in water aerobics

## 2011-09-02 LAB — BASIC METABOLIC PANEL
BUN: 8
Creatinine, Ser: 0.76
GFR calc non Af Amer: >60
Glucose, Bld: 85

## 2011-09-02 LAB — CBC
Platelets: 301
RDW: 13.8
WBC: 3.1 — ABNORMAL LOW

## 2011-09-05 ENCOUNTER — Other Ambulatory Visit (HOSPITAL_COMMUNITY): Payer: Self-pay | Admitting: Pulmonary Disease

## 2011-09-05 DIAGNOSIS — Z139 Encounter for screening, unspecified: Secondary | ICD-10-CM

## 2011-10-17 ENCOUNTER — Ambulatory Visit (HOSPITAL_COMMUNITY)
Admission: RE | Admit: 2011-10-17 | Discharge: 2011-10-17 | Disposition: A | Discharge status: Home / Self Care | Payer: 59 | Source: Ambulatory Visit | Attending: Pulmonary Disease | Admitting: Pulmonary Disease

## 2011-10-17 DIAGNOSIS — Z1231 Encounter for screening mammogram for malignant neoplasm of breast: Secondary | ICD-10-CM | POA: Insufficient documentation

## 2011-10-17 DIAGNOSIS — Z139 Encounter for screening, unspecified: Secondary | ICD-10-CM

## 2012-01-15 ENCOUNTER — Emergency Department (HOSPITAL_COMMUNITY): Payer: PRIVATE HEALTH INSURANCE

## 2012-01-15 ENCOUNTER — Emergency Department (HOSPITAL_COMMUNITY)
Admission: EM | Admit: 2012-01-15 | Discharge: 2012-01-15 | Disposition: A | Discharge status: Home / Self Care | Payer: PRIVATE HEALTH INSURANCE | Attending: Emergency Medicine | Admitting: Emergency Medicine

## 2012-01-15 ENCOUNTER — Encounter (HOSPITAL_COMMUNITY): Payer: Self-pay

## 2012-01-15 DIAGNOSIS — R609 Edema, unspecified: Secondary | ICD-10-CM | POA: Insufficient documentation

## 2012-01-15 DIAGNOSIS — X500XXA Overexertion from strenuous movement or load, initial encounter: Secondary | ICD-10-CM | POA: Insufficient documentation

## 2012-01-15 DIAGNOSIS — M25549 Pain in joints of unspecified hand: Secondary | ICD-10-CM | POA: Insufficient documentation

## 2012-01-15 DIAGNOSIS — S60221A Contusion of right hand, initial encounter: Secondary | ICD-10-CM

## 2012-01-15 DIAGNOSIS — R29898 Other symptoms and signs involving the musculoskeletal system: Secondary | ICD-10-CM | POA: Insufficient documentation

## 2012-01-15 DIAGNOSIS — M79609 Pain in unspecified limb: Secondary | ICD-10-CM | POA: Insufficient documentation

## 2012-01-15 DIAGNOSIS — S60229A Contusion of unspecified hand, initial encounter: Secondary | ICD-10-CM | POA: Insufficient documentation

## 2012-01-15 NOTE — ED Notes (Signed)
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  C/O pain across knuckles of r hand.

## 2012-01-15 NOTE — ED Notes (Signed)
Ice pack given to patient and applied to right hand.

## 2012-01-15 NOTE — ED Notes (Signed)
Julie Idol, PA at bedside 

## 2012-01-15 NOTE — ED Notes (Signed)
Patient with no complaints at this time. Respirations even and unlabored. Skin warm/dry. Discharge instructions reviewed with patient at this time. Patient given opportunity to voice concerns/ask questions. Patient discharged at this time and left Emergency Department with steady gait.   

## 2012-01-16 NOTE — ED Provider Notes (Signed)
Medical screening examination/treatment/procedure(s) were performed by non-physician practitioner and as supervising physician I was immediately available for consultation/collaboration.   Benny Lennert, MD 01/16/12 1919

## 2012-01-16 NOTE — ED Provider Notes (Signed)
History     CSN: [id number]  Arrival date & time 01/15/12  [id number]   First MD Initiated Contact with Patient 01/15/12 [phone]      Chief Complaint  Patient presents with  . Hand Pain    (Consider location/radiation/quality/duration/timing/severity/associated sxs/prior treatment) Patient is a 51 y.o. female presenting with hand pain. The history is provided by the patient.  Hand Pain This is a new problem. 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 The problem occurs constantly. The problem has been unchanged. Associated symptoms include arthralgias and weakness. Pertinent negatives include no abdominal pain, chest pain, congestion, fever, headaches, joint swelling, nausea, neck pain, numbness, rash or sore throat. Exacerbated by: Movement and palpation. She has tried nothing for the symptoms.    Past Medical History  Diagnosis Date  . Anxiety     Past Surgical History  Procedure Date  . Cesarean section     x2  . Vesicovaginal fistula closure w/ tah   . Hemorrhoid surgery   . Carpal tunnel right hand   . Knee arthroscopy     2010, RIGHT KNEE     No family history on file.  History  Substance Use Topics  . Smoking status: Never Smoker   . Smokeless tobacco: Not on file  . Alcohol Use: No    OB History    Grav Para Term Preterm Abortions TAB SAB Ect Mult Living                  Review of Systems  Constitutional: Negative for fever.  HENT: Negative for congestion, sore throat and neck pain.   Eyes: Negative.   Respiratory: Negative for chest tightness and shortness of breath.   Cardiovascular: Negative for chest pain.  Gastrointestinal: Negative for nausea and abdominal pain.  Genitourinary: Negative.   Musculoskeletal: Positive for arthralgias. Negative for joint  swelling.  Skin: Negative.  Negative for rash and wound.  Neurological: Positive for weakness. Negative for dizziness, light-headedness, numbness and headaches.  Hematological: Negative.   Psychiatric/Behavioral: Negative.     Allergies  Review of patient's allergies indicates no known allergies.  Home Medications   Current Outpatient Rx  Name Route Sig Dispense Refill  . ALPRAZOLAM 0.5 MG PO TABS Oral Take 0.5 mg by mouth as needed.      Marland Kitchen GABAPENTIN 100 MG PO CAPS  Start with one tablet at night, increase up to three at night as needed 90 capsule 3  . HYDROCODONE-ACETAMINOPHEN 5-325 MG PO TABS Oral Take 1 tablet by mouth every 4 (four) hours as needed for pain. 42 tablet 0  . FIBERCHOICE PO Oral Take by mouth.      . MULTIVITAMIN PO Oral Take by mouth.      . NABUMETONE 500 MG PO TABS Oral Take 1 tablet (500 mg total) by mouth 2 (two) times daily. 60 tablet 5  . POLYETHYLENE GLYCOL 3350 PO POWD Oral Take 17 g by mouth daily.      Marland Kitchen PREDNISONE 10 MG PO KIT Oral Take 1 kit (10 mg total) by mouth as directed. 1 kit 0    Dispense as written.  Marland Kitchen VITAMIN B-6 100 MG PO TABS Oral Take 100 mg by mouth 2 (two) times daily.      . BENEFIBER PO POWD Oral Take by mouth daily.      Marland Kitchen  ZONISAMIDE PO Oral Take by mouth.        BP 127/83  Pulse 83  Temp(Src) 98.2 F (36.8 C) (Oral)  Resp 18  Ht 5\' 1"  (1.549 m)  Wt 156 lb (70.761 kg)  BMI 29.48 kg/m2  SpO2 100%  Physical Exam  Nursing note and vitals reviewed. Constitutional: She is oriented to person, place, and time. She appears well-developed and well-nourished.  HENT:  Head: Normocephalic.  Eyes: Conjunctivae are normal.  Neck: Normal range of motion.  Cardiovascular: Normal rate and intact distal pulses.  Exam reveals no decreased pulses.   Pulses:      Radial pulses are 2+ on the right side, and 2+ on the left side.  Pulmonary/Chest: Effort normal.  Musculoskeletal: She exhibits edema and tenderness.       Right hand: She  exhibits tenderness and bony tenderness. She exhibits normal range of motion, normal two-point discrimination, normal capillary refill, no deformity and no swelling. normal sensation noted. Decreased strength noted.       Hands:      Decreased grip strength right hand secondary to pain.  Neurological: She is alert and oriented to person, place, and time. No sensory deficit.  Skin: Skin is warm, dry and intact.    ED Course  Procedures (including critical care time)  Labs Reviewed - No data to display Dg Hand Complete Right  01/15/2012  *RADIOLOGY REPORT*  Clinical Data: Pain in right index finger  RIGHT HAND - COMPLETE 3+ VIEW  Comparison: None.  Findings: No fracture or dislocation is seen.  The joint spaces are preserved.  The visualized soft tissues are unremarkable.  IMPRESSION: No acute osseous abnormality is seen.  Original Report Authenticated By: Charline Bills, M.D.     1. Contusion of hand, right       MDM  Encouraged ice pack for 10 minutes every hour while awake for the next days. Also encouraged ibuprofen which patient has at home. When necessary followup if symptoms persist beyond the next week.        Candis Musa, PA 01/16/12 [phone]

## 2012-08-02 ENCOUNTER — Encounter: Payer: Self-pay | Admitting: Internal Medicine

## 2012-08-03 ENCOUNTER — Ambulatory Visit (INDEPENDENT_AMBULATORY_CARE_PROVIDER_SITE_OTHER): Payer: 59 | Admitting: Urgent Care

## 2012-08-03 ENCOUNTER — Encounter: Payer: Self-pay | Admitting: Urgent Care

## 2012-08-03 ENCOUNTER — Other Ambulatory Visit: Payer: Self-pay | Admitting: Internal Medicine

## 2012-08-03 VITALS — BP 111/72 | HR 72 | Temp 98.2°F | Ht 61.0 in | Wt 155.6 lb

## 2012-08-03 DIAGNOSIS — Z800 Family history of malignant neoplasm of digestive organs: Secondary | ICD-10-CM | POA: Insufficient documentation

## 2012-08-03 DIAGNOSIS — K921 Melena: Secondary | ICD-10-CM

## 2012-08-03 DIAGNOSIS — Z8719 Personal history of other diseases of the digestive system: Secondary | ICD-10-CM

## 2012-08-03 MED ORDER — PEG 3350-KCL-NA BICARB-NACL 420 G PO SOLR: 4000.0000 mL | ORAL | Status: AC

## 2012-08-03 NOTE — Progress Notes (Signed)
Primary Care Physician:  Fredirick Maudlin, MD Primary Gastroenterologist:  Dr. Jena Gauss  Chief Complaint  Patient presents with  . Hemorrhoids  . Rectal Bleeding    HPI:  Nicole Gilbert is a 51 y.o. female here for bloody diarrhea.  Took OTC probiotic 1 week ago & had a couple loose stools w/ BRB mixed in stool for 2 days  Now stopped.  Back to regular BMs.  Hx hemorrhoids 2009 w/ surgery by Dr Lovell Sheehan.  Feels like tingling sensation in rectum.  She was taking 2 Aleve per day for 3 days for muscle aches.  Intermittent LLQ pain for a month.  Pain 4/10.  Better w/ BM.  No hx constipation.   Denies any upper GI symptoms including heartburn, indigestion, nausea, vomiting, dysphagia, odynophagia or anorexia.  Weight stable.  Appetite ok.   Last colonoscopy in 2009 as below.  She is has a family history of colon cancer in her brother in his 66s.  Past Medical History  Diagnosis Date  . Anxiety   . Hemorrhoids 2006  . GERD (gastroesophageal reflux disease)   . Chronic constipation   . Hyperlipemia     Past Surgical History  Procedure Date  . Cesarean section     x2  . Vesicovaginal fistula closure w/ tah   . Hemorrhoid surgery 06/30/2008    Dr Lovell Sheehan  . Carpal tunnel right hand   . Knee arthroscopy     2010, RIGHT KNEE   . Esophagogastroduodenoscopy  01/20/2005    Rourk- Normal-appearing esophagus, status post passage of a 69 French Maloney  dilator/ small HH/  Otherwise normal stomach  . 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 ALPRAZolam (XANAX) 0.5 MG tablet Take 0.5 mg by mouth as needed.        . gabapentin (NEURONTIN) 100 MG capsule Take 300 mg by mouth daily. Start with one tablet at night, increase up to three at night as needed      . Lactobacillus Rhamnosus, GG,  (CULTURELLE) CAPS Take 1 capsule by mouth daily.      . Multiple Vitamin (MULTIVITAMIN PO) Take by mouth.        . polyethylene glycol powder (MIRALAX) powder Take 17 g by mouth daily.        Marland Kitchen pyridOXINE (VITAMIN B-6) 100 MG tablet Take 100 mg by mouth 2 (two) times daily.        Marland Kitchen DISCONTD: gabapentin (NEURONTIN) 100 MG capsule Start with one tablet at night, increase up to three at night as needed  90 capsule  3  . polyethylene glycol-electrolytes (TRILYTE) 420 G solution Take 4,000 mLs by mouth as directed.  4000 mL  0    Allergies as of 08/03/2012  . (No Known Allergies)    Family History  Problem Relation Age of Onset  . Colon cancer Brother 43    (paternal half brother)  . Diabetes Father   . Coronary artery disease Maternal Grandmother     History   Social History  . Marital Status: Single    Spouse Name: N/A    Number of Children: 2  . Years of Education: N/A   Occupational History  . nursing assistant, [employer]   .     Social History Main Topics  . Smoking status: Never Smoker   .  Smokeless tobacco: Not on file  . Alcohol Use: No  . Drug Use: No  . Sexually Active: Not on file   Other Topics Concern  . Not on file   Social History Narrative  . No narrative on file    Review of Systems: Gen: Denies any fever, chills, sweats, anorexia, fatigue, weakness, malaise, weight loss, and sleep disorder CV: Denies chest pain, angina, palpitations, syncope, orthopnea, PND, peripheral edema, and claudication. Resp: Denies dyspnea at rest, dyspnea with exercise, cough, sputum, wheezing, coughing up blood, and pleurisy. GI: Denies vomiting blood, jaundice, and fecal incontinence.   Denies dysphagia or odynophagia. GU : Denies urinary burning, blood in urine, urinary frequency, urinary hesitancy, nocturnal urination, and urinary incontinence. MS: Denies joint pain, limitation of movement, and swelling, stiffness, low back pain, extremity pain. Denies muscle weakness,  cramps, atrophy.  Derm: Denies rash, itching, dry skin, hives, moles, warts, or unhealing ulcers.  Psych: Denies depression, anxiety, memory loss, suicidal ideation, hallucinations, paranoia, and confusion. Heme: Denies bruising, bleeding, and enlarged lymph nodes. Neuro:  Denies any headaches, dizziness, paresthesias. Endo:  Denies any problems with DM, thyroid, adrenal function.  Physical Exam: BP 111/72  Pulse 72  Temp 98.2 F (36.8 C) (Temporal)  Ht 5\' 1"  (1.549 m)  Wt 155 lb 9.6 oz (70.58 kg)  BMI 29.40 kg/m2 No LMP recorded. Patient has had a hysterectomy. General:   Alert,  Well-developed, well-nourished, pleasant and cooperative in NAD Head:  Normocephalic and atraumatic. Eyes:  Sclera clear, no icterus.   Conjunctiva pink. Ears:  Normal auditory acuity. Nose:  No deformity, discharge, or lesions. Mouth:  No deformity or lesions,oropharynx pink & moist. Neck:  Supple; no masses or thyromegaly. Lungs:  Clear throughout to auscultation.   No wheezes, crackles, or rhonchi. No acute distress. Heart:  Regular rate and rhythm; no murmurs, clicks, rubs,  or gallops. Abdomen:  Normal bowel sounds.  No bruits.  Soft, non-tender and non-distended without masses, hepatosplenomegaly or hernias noted.  No guarding or rebound tenderness.   Rectal:  No internal or external hemorrhoids or masses visualized.  Small amount medium brown obtained from the vault was hemoccult positive. Msk:  Symmetrical without gross deformities. Normal posture. Pulses:  Normal pulses noted. Extremities:  No clubbing or edema. Neurologic:  Alert and  oriented x4;  grossly normal neurologically. Skin:  Intact without significant lesions or rashes. Lymph Nodes:  No significant cervical adenopathy. Psych:  Alert and cooperative. Normal mood and affect.

## 2012-08-03 NOTE — Assessment & Plan Note (Addendum)
Nicole Gilbert is a pleasant 51 y.o. female with 2 day history of intermittent hematochezia and diarrhea in the setting of NSAIDs. She has history of hemorrhoids status post hemorrhoidectomy 4 years ago. Last colonoscopy was 4 years ago. I suspect she may have NSAID-induced enteropathy as the culprit of her symptoms, however given her family history of colon cancer, the fact that her rectal exam is benign for hemorrhoids today, and that her last colonoscopy has been 4 years ago, I do feel she needs further evaluation with complete colonoscopy with Dr. Jena Gauss.  I have discussed risks & benefits which include, but are not limited to, bleeding, infection, perforation & drug reaction.  The patient agrees with this plan & written consent will be obtained.    Avoid aspirin or anti-inflammatory products like ALEVE, Advil, Goody's, etc until colonoscopy

## 2012-08-03 NOTE — Patient Instructions (Addendum)
Avoid aspirin or anti-inflammatory products like ALEVE, Advil, Goody's, etc Colonoscopy with Dr Jena Gauss

## 2012-08-07 NOTE — Progress Notes (Signed)
Faxed to PCP

## 2012-08-13 ENCOUNTER — Encounter (HOSPITAL_COMMUNITY): Payer: Self-pay | Admitting: Pharmacy Technician

## 2012-08-21 MED ORDER — SODIUM CHLORIDE 0.45 % IV SOLN
INTRAVENOUS | Status: DC
  Administered 2012-08-22: 08:00:00 via INTRAVENOUS

## 2012-08-22 ENCOUNTER — Ambulatory Visit (HOSPITAL_COMMUNITY)
Admission: RE | Admit: 2012-08-22 | Discharge: 2012-08-22 | Disposition: A | Discharge status: Home / Self Care | Payer: 59 | Source: Ambulatory Visit | Attending: Internal Medicine | Admitting: Internal Medicine

## 2012-08-22 ENCOUNTER — Encounter (HOSPITAL_COMMUNITY): Payer: Self-pay | Admitting: *Deleted

## 2012-08-22 ENCOUNTER — Encounter (HOSPITAL_COMMUNITY)
Admission: RE | Disposition: A | Discharge status: Home / Self Care | Payer: Self-pay | Source: Ambulatory Visit | Attending: Internal Medicine

## 2012-08-22 DIAGNOSIS — R197 Diarrhea, unspecified: Secondary | ICD-10-CM

## 2012-08-22 DIAGNOSIS — K621 Rectal polyp: Secondary | ICD-10-CM

## 2012-08-22 DIAGNOSIS — D126 Benign neoplasm of colon, unspecified: Secondary | ICD-10-CM

## 2012-08-22 DIAGNOSIS — Z800 Family history of malignant neoplasm of digestive organs: Secondary | ICD-10-CM

## 2012-08-22 DIAGNOSIS — E785 Hyperlipidemia, unspecified: Secondary | ICD-10-CM | POA: Insufficient documentation

## 2012-08-22 DIAGNOSIS — K921 Melena: Secondary | ICD-10-CM | POA: Insufficient documentation

## 2012-08-22 DIAGNOSIS — K620 Anal polyp: Secondary | ICD-10-CM

## 2012-08-22 DIAGNOSIS — D128 Benign neoplasm of rectum: Secondary | ICD-10-CM | POA: Insufficient documentation

## 2012-08-22 HISTORY — PX: COLONOSCOPY: SHX5424

## 2012-08-22 SURGERY — COLONOSCOPY
Anesthesia: Moderate Sedation

## 2012-08-22 MED ORDER — MEPERIDINE HCL 100 MG/ML IJ SOLN
INTRAMUSCULAR | Status: AC
  Filled 2012-08-22: qty 1

## 2012-08-22 MED ORDER — MEPERIDINE HCL 100 MG/ML IJ SOLN
INTRAMUSCULAR | Status: DC | PRN
  Administered 2012-08-22: 50 mg via INTRAVENOUS
  Administered 2012-08-22: 25 mg via INTRAVENOUS

## 2012-08-22 MED ORDER — MIDAZOLAM HCL 5 MG/5ML IJ SOLN
INTRAMUSCULAR | Status: DC | PRN
  Administered 2012-08-22 (×2): 1 mg via INTRAVENOUS
  Administered 2012-08-22: 2 mg via INTRAVENOUS

## 2012-08-22 MED ORDER — MIDAZOLAM HCL 5 MG/5ML IJ SOLN
INTRAMUSCULAR | Status: AC
  Filled 2012-08-22: qty 10

## 2012-08-22 MED ORDER — STERILE WATER FOR IRRIGATION IR SOLN
Status: DC | PRN
  Administered 2012-08-22: 09:00:00

## 2012-08-22 NOTE — H&P (View-Only) (Signed)
Primary Care Physician:  HAWKINS,EDWARD L, MD Primary Gastroenterologist:  Dr. Rourk  Chief Complaint  Patient presents with  . Hemorrhoids  . Rectal Bleeding    HPI:  Nicole Gilbert is a 51 y.o. female here for bloody diarrhea.  Took OTC probiotic 1 week ago & had a couple loose stools w/ BRB mixed in stool for 2 days  Now stopped.  Back to regular BMs.  Hx hemorrhoids 2009 w/ surgery by Dr Jenkins.  Feels like tingling sensation in rectum.  She was taking 2 Aleve per day for 3 days for muscle aches.  Intermittent LLQ pain for a month.  Pain 4/10.  Better w/ BM.  No hx constipation.   Denies any upper GI symptoms including heartburn, indigestion, nausea, vomiting, dysphagia, odynophagia or anorexia.  Weight stable.  Appetite ok.   Last colonoscopy in 2009 as below.  She is has a family history of colon cancer in her brother in his 50s.  Past Medical History  Diagnosis Date  . Anxiety   . Hemorrhoids 2006  . GERD (gastroesophageal reflux disease)   . Chronic constipation   . Hyperlipemia     Past Surgical History  Procedure Date  . Cesarean section     x2  . Vesicovaginal fistula closure w/ tah   . Hemorrhoid surgery 06/30/2008    Dr Jenkins  . Carpal tunnel right hand   . Knee arthroscopy     2010, RIGHT KNEE   . Esophagogastroduodenoscopy  01/20/2005    Rourk- Normal-appearing esophagus, status post passage of a 56 French Maloney  dilator/ small HH/  Otherwise normal stomach  . 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 ALPRAZolam (XANAX) 0.5 MG tablet Take 0.5 mg by mouth as needed.        . gabapentin (NEURONTIN) 100 MG capsule Take 300 mg by mouth daily. Start with one tablet at night, increase up to three at night as needed      . Lactobacillus Rhamnosus, GG,  (CULTURELLE) CAPS Take 1 capsule by mouth daily.      . Multiple Vitamin (MULTIVITAMIN PO) Take by mouth.        . polyethylene glycol powder (MIRALAX) powder Take 17 g by mouth daily.        . pyridOXINE (VITAMIN B-6) 100 MG tablet Take 100 mg by mouth 2 (two) times daily.        . DISCONTD: gabapentin (NEURONTIN) 100 MG capsule Start with one tablet at night, increase up to three at night as needed  90 capsule  3  . polyethylene glycol-electrolytes (TRILYTE) 420 G solution Take 4,000 mLs by mouth as directed.  4000 mL  0    Allergies as of 08/03/2012  . (No Known Allergies)    Family History  Problem Relation Age of Onset  . Colon cancer Brother 52    (paternal half brother)  . Diabetes Father   . Coronary artery disease Maternal Grandmother     History   Social History  . Marital Status: Single    Spouse Name: N/A    Number of Children: 2  . Years of Education: N/A   Occupational History  . nursing assistant, [employer]   .     Social History Main Topics  . Smoking status: Never Smoker   .   Smokeless tobacco: Not on file  . Alcohol Use: No  . Drug Use: No  . Sexually Active: Not on file   Other Topics Concern  . Not on file   Social History Narrative  . No narrative on file    Review of Systems: Gen: Denies any fever, chills, sweats, anorexia, fatigue, weakness, malaise, weight loss, and sleep disorder CV: Denies chest pain, angina, palpitations, syncope, orthopnea, PND, peripheral edema, and claudication. Resp: Denies dyspnea at rest, dyspnea with exercise, cough, sputum, wheezing, coughing up blood, and pleurisy. GI: Denies vomiting blood, jaundice, and fecal incontinence.   Denies dysphagia or odynophagia. GU : Denies urinary burning, blood in urine, urinary frequency, urinary hesitancy, nocturnal urination, and urinary incontinence. MS: Denies joint pain, limitation of movement, and swelling, stiffness, low back pain, extremity pain. Denies muscle weakness,  cramps, atrophy.  Derm: Denies rash, itching, dry skin, hives, moles, warts, or unhealing ulcers.  Psych: Denies depression, anxiety, memory loss, suicidal ideation, hallucinations, paranoia, and confusion. Heme: Denies bruising, bleeding, and enlarged lymph nodes. Neuro:  Denies any headaches, dizziness, paresthesias. Endo:  Denies any problems with DM, thyroid, adrenal function.  Physical Exam: BP 111/72  Pulse 72  Temp 98.2 F (36.8 C) (Temporal)  Ht 5' 1" (1.549 m)  Wt 155 lb 9.6 oz (70.58 kg)  BMI 29.40 kg/m2 No LMP recorded. Patient has had a hysterectomy. General:   Alert,  Well-developed, well-nourished, pleasant and cooperative in NAD Head:  Normocephalic and atraumatic. Eyes:  Sclera clear, no icterus.   Conjunctiva pink. Ears:  Normal auditory acuity. Nose:  No deformity, discharge, or lesions. Mouth:  No deformity or lesions,oropharynx pink & moist. Neck:  Supple; no masses or thyromegaly. Lungs:  Clear throughout to auscultation.   No wheezes, crackles, or rhonchi. No acute distress. Heart:  Regular rate and rhythm; no murmurs, clicks, rubs,  or gallops. Abdomen:  Normal bowel sounds.  No bruits.  Soft, non-tender and non-distended without masses, hepatosplenomegaly or hernias noted.  No guarding or rebound tenderness.   Rectal:  No internal or external hemorrhoids or masses visualized.  Small amount medium brown obtained from the vault was hemoccult positive. Msk:  Symmetrical without gross deformities. Normal posture. Pulses:  Normal pulses noted. Extremities:  No clubbing or edema. Neurologic:  Alert and  oriented x4;  grossly normal neurologically. Skin:  Intact without significant lesions or rashes. Lymph Nodes:  No significant cervical adenopathy. Psych:  Alert and cooperative. Normal mood and affect.   

## 2012-08-22 NOTE — Interval H&P Note (Signed)
History and Physical Interval Note:  08/22/2012 8:44 AM  Nicole Gilbert  has presented today for surgery, with the diagnosis of HEMATOCHEZIA AND FAMILY HISTORY OF COLON CANCER  The various methods of treatment have been discussed with the patient and family. After consideration of risks, benefits and other options for treatment, the patient has consented to  Procedure(s) (LRB) with comments: COLONOSCOPY (N/A) - 8:30 as a surgical intervention .  The patient's history has been reviewed, patient examined, no change in status, stable for surgery.  I have reviewed the patient's chart and labs.  Questions were answered to the patient's satisfaction.     Eula Listen  Patient seen. Rectal bleeding and bowel function almost completely back to baseline since she was evaluated in the office. Colonoscopy today.  The risks, benefits, limitations, alternatives and imponderables have been reviewed with the patient. Questions have been answered. All parties are agreeable.

## 2012-08-22 NOTE — Op Note (Signed)
[facility] [address] [place], [address]   COLONOSCOPY PROCEDURE REPORT  PATIENT: Nicole, Gilbert  MR#:         [id number] BIRTHDATE: May 16, 1961 , 51  yrs. old GENDER: Female ENDOSCOPIST: R.  Roetta Sessions, MD FACP [facility] REFERRED BY:  Kari Baars, M.D. PROCEDURE DATE:  08/22/2012 PROCEDURE:     Ileocolonoscopy with segment biopsies and snare polypectomy  INDICATIONS: Recent bloody diarrhea - much improved spontaneously since seen in the office nearly 3 weeks ago  INFORMED CONSENT:  The risks, benefits, alternatives and imponderables including but not limited to bleeding, perforation as well as the possibility of a missed lesion have been reviewed.  The potential for biopsy, lesion removal, etc. have also been discussed.  Questions have been answered.  All parties agreeable. Please see the history and physical in the medical record for more information.  MEDICATIONS: Versed 4 mg IV and Demerol 75 mg IV in divided doses.  DESCRIPTION OF PROCEDURE:  After a digital rectal exam was performed, the EC-3890Li ([id number])  colonoscope was advanced from the anus through the rectum and colon to the area of the cecum, ileocecal valve and appendiceal orifice.  The cecum was deeply intubated.  These structures were well-seen and photographed for the record.  From the level of the cecum and ileocecal valve, the scope was slowly and cautiously withdrawn.  The mucosal surfaces were carefully surveyed utilizing scope tip deflection to facilitate fold flattening as needed.  The scope was pulled down into the rectum where a thorough examination including retroflexion was performed.    FINDINGS:  Adequiate preparation. Friable anal canal with the prominent superficial blood vessels present. However, these were tiny. No significant hemorrhoidal disease. Single diminutive polyp in the rectum at 8 cm from the anal verge; otherwise, normal rectal mucosa. Slightly friable  granular appearing mucosa throughout the colon diffusely. No frank erosion or ulcer crater seen. A 6 mm polyp with central depression in the mid ascending segment. There was also a single diminutive polyp in the midsigmoid segment; otherwise, remainder of the colonic mucosa appeared normal. The distal 10 cm of terminal ileal mucosa appeared normal.  THERAPEUTIC / DIAGNOSTIC MANEUVERS PERFORMED:  The ascending colon polyp was hot snared; the remaining polyps were cold biopsy removed. Also, segment biopsies of the ascending and sigmoid segments were taken for histologic study.  COMPLICATIONS: none  CECAL WITHDRAWAL TIME:  15 minutes  IMPRESSION:  Colonic polyps-removed as described above. Mildly diffusely abnormal-appearing colon of uncertain significance. Query recent infectious colitis with possible contributing NSAID effect.   Recommendations:  Avoid NSAIDs. Followup on pathology   _______________________________ eSigned:  R. Roetta Sessions, MD FACP [facility] 08/22/2012 9:25 AM   CC:

## 2012-08-24 ENCOUNTER — Encounter (HOSPITAL_COMMUNITY): Payer: Self-pay | Admitting: Internal Medicine

## 2012-08-26 ENCOUNTER — Encounter: Payer: Self-pay | Admitting: Internal Medicine

## 2012-08-27 ENCOUNTER — Encounter: Payer: Self-pay | Admitting: *Deleted

## 2012-08-28 ENCOUNTER — Telehealth: Payer: Self-pay | Admitting: Internal Medicine

## 2012-08-28 NOTE — Telephone Encounter (Signed)
Patient is asking for procedure results please call

## 2012-08-29 NOTE — Telephone Encounter (Signed)
LMOM to call back

## 2012-08-30 NOTE — Telephone Encounter (Signed)
Letter was mailed to pt on Monday.

## 2012-09-11 ENCOUNTER — Other Ambulatory Visit (HOSPITAL_COMMUNITY): Payer: Self-pay | Admitting: Pulmonary Disease

## 2012-09-11 DIAGNOSIS — Z139 Encounter for screening, unspecified: Secondary | ICD-10-CM

## 2012-10-04 ENCOUNTER — Ambulatory Visit (INDEPENDENT_AMBULATORY_CARE_PROVIDER_SITE_OTHER): Payer: 59

## 2012-10-04 ENCOUNTER — Ambulatory Visit (INDEPENDENT_AMBULATORY_CARE_PROVIDER_SITE_OTHER): Payer: 59 | Admitting: Orthopedic Surgery

## 2012-10-04 ENCOUNTER — Encounter: Payer: Self-pay | Admitting: Orthopedic Surgery

## 2012-10-04 VITALS — BP 112/80 | Ht 61.0 in | Wt 155.0 lb

## 2012-10-04 DIAGNOSIS — M25569 Pain in unspecified knee: Secondary | ICD-10-CM

## 2012-10-04 DIAGNOSIS — IMO0002 Reserved for concepts with insufficient information to code with codable children: Secondary | ICD-10-CM

## 2012-10-04 DIAGNOSIS — M1710 Unilateral primary osteoarthritis, unspecified knee: Secondary | ICD-10-CM | POA: Insufficient documentation

## 2012-10-04 DIAGNOSIS — M659 Synovitis and tenosynovitis, unspecified: Secondary | ICD-10-CM

## 2012-10-04 DIAGNOSIS — M6580 Other synovitis and tenosynovitis, unspecified site: Secondary | ICD-10-CM

## 2012-10-04 MED ORDER — HYDROCODONE-ACETAMINOPHEN 5-325 MG PO TABS: 1.0000 | ORAL_TABLET | Freq: Four times a day (QID) | ORAL | Status: DC | PRN

## 2012-10-04 NOTE — Patient Instructions (Addendum)
Ibuprofen 800 mg 3 x a day  norco 5 mg Q6 prn  Rest  Ice

## 2012-10-04 NOTE — Progress Notes (Signed)
Patient ID: Nicole Gilbert, female   DOB: 12/28/1960, 51 y.o.   MRN: [id number] Knee  Injection Procedure Note  Pre-operative Diagnosis: right knee oa  Post-operative Diagnosis: same  Indications: pain  Anesthesia: ethyl chloride   Procedure Details   Verbal consent was obtained for the procedure. Time out was completed.The joint was prepped with alcohol, followed by  Ethyl chloride spray and A 20 gauge needle was inserted into the knee via lateral approach; 4ml 1% lidocaine and 1 ml of depomedrol  was then injected into the joint . The needle was removed and the area cleansed and dressed.  Complications:  None; patient tolerated the procedure well.

## 2012-10-04 NOTE — Progress Notes (Signed)
Patient ID: Nicole Gilbert, female   DOB: 1961-03-04, 51 y.o.   MRN: [id number] Chief Complaint  Patient presents with  . Follow-up    Recheck right knee.   BP 112/80  Ht 5\' 1"  (1.549 m)  Wt 155 lb (70.308 kg)  BMI 29.29 kg/m2  1. OA (osteoarthritis) of knee  HYDROcodone-acetaminophen (NORCO/VICODIN) 5-325 MG per tablet  2. Knee pain  DG Knee AP/LAT W/Sunrise Right  3. Synovitis of knee     Current Outpatient Prescriptions on File Prior to Visit  Medication Sig Dispense Refill  . ALPRAZolam (XANAX) 0.5 MG tablet Take 0.5 mg by mouth daily as needed. Anxiety      . gabapentin (NEURONTIN) 100 MG capsule Take 300 mg by mouth at bedtime.       . Multiple Vitamin (MULTIVITAMIN WITH MINERALS) TABS Take 1 tablet by mouth daily.      . Lactobacillus Rhamnosus, GG, (CULTURELLE) CAPS Take 1 capsule by mouth daily.      . polyethylene glycol powder (MIRALAX) powder Take 17 g by mouth daily as needed. Constipation       Patient presents with a chief complaint of pain in her right knee with swelling. The knee feels tight. She status post arthroscopy right knee in April 2010. She's been kind of active lately she's been able to bowl, she's been working out doing leg presses the elliptical machine and supportive therapy. She's trying to lose weight. She stands on her feet 12 hours a day she loves her job. The pain is rather diffuse it described as aching she has some associated numbness and tingling related to an underlying lumbar disc disease issue which she receives epidurals when needed  Review of systems as stated.  Vital signs are recorded.  She is ambulating with a limp. She's awake alert and oriented x3 mood and affect are normal and her appearance is normal as well.  Examination of the right knee goes she has a joint effusion swelling of the synovium, tenths range of motion but flexion is 120. She has limitation of extension of 10. The collateral ligaments and cruciate ligaments are stable  muscle tone is normal skin is intact is a good pulse in the extremity temperature normal no edema lymph nodes are benign sensation remains intact over the knee joint and lower leg. No pathologic reflexes and straight leg raise reveals a mild tension sign. Coordination and balance are otherwise normal.  Radiographs show that she has adequate joint space medial and lateral with a marginal osteophyte on the lateral compartment on the femur.  Impression synovitis  Impression osteoarthritis  We attempted aspiration of the knee and I get back any fluid we did go ahead and injected with a cortisone shot  Recommendations are for activity modification. Strengthening exercises for the knee. Anti-inflammatories with ibuprofen 3 times a day. Norco 5 mg as needed.  I will leave her appointment open for phone call as needed.  She's going on a cruise we wish her luck all see her when she gets back if needed.

## 2012-10-22 ENCOUNTER — Ambulatory Visit (HOSPITAL_COMMUNITY)
Admission: RE | Admit: 2012-10-22 | Discharge: 2012-10-22 | Disposition: A | Discharge status: Home / Self Care | Payer: 59 | Source: Ambulatory Visit | Attending: Pulmonary Disease | Admitting: Pulmonary Disease

## 2012-10-22 DIAGNOSIS — Z139 Encounter for screening, unspecified: Secondary | ICD-10-CM

## 2012-10-22 DIAGNOSIS — Z1231 Encounter for screening mammogram for malignant neoplasm of breast: Secondary | ICD-10-CM | POA: Insufficient documentation

## 2012-11-17 ENCOUNTER — Encounter (HOSPITAL_COMMUNITY): Payer: Self-pay

## 2012-11-17 ENCOUNTER — Emergency Department (HOSPITAL_COMMUNITY): Payer: 59

## 2012-11-17 ENCOUNTER — Emergency Department (HOSPITAL_COMMUNITY)
Admission: EM | Admit: 2012-11-17 | Discharge: 2012-11-17 | Disposition: A | Discharge status: Home / Self Care | Payer: 59 | Attending: Emergency Medicine | Admitting: Emergency Medicine

## 2012-11-17 DIAGNOSIS — R059 Cough, unspecified: Secondary | ICD-10-CM | POA: Insufficient documentation

## 2012-11-17 DIAGNOSIS — J029 Acute pharyngitis, unspecified: Secondary | ICD-10-CM | POA: Insufficient documentation

## 2012-11-17 DIAGNOSIS — Z8719 Personal history of other diseases of the digestive system: Secondary | ICD-10-CM | POA: Insufficient documentation

## 2012-11-17 DIAGNOSIS — E785 Hyperlipidemia, unspecified: Secondary | ICD-10-CM | POA: Insufficient documentation

## 2012-11-17 DIAGNOSIS — F411 Generalized anxiety disorder: Secondary | ICD-10-CM | POA: Insufficient documentation

## 2012-11-17 DIAGNOSIS — R52 Pain, unspecified: Secondary | ICD-10-CM | POA: Insufficient documentation

## 2012-11-17 DIAGNOSIS — Z87891 Personal history of nicotine dependence: Secondary | ICD-10-CM | POA: Insufficient documentation

## 2012-11-17 DIAGNOSIS — Z79899 Other long term (current) drug therapy: Secondary | ICD-10-CM | POA: Insufficient documentation

## 2012-11-17 DIAGNOSIS — K5900 Constipation, unspecified: Secondary | ICD-10-CM | POA: Insufficient documentation

## 2012-11-17 DIAGNOSIS — R51 Headache: Secondary | ICD-10-CM | POA: Insufficient documentation

## 2012-11-17 DIAGNOSIS — Z8679 Personal history of other diseases of the circulatory system: Secondary | ICD-10-CM | POA: Insufficient documentation

## 2012-11-17 DIAGNOSIS — R05 Cough: Secondary | ICD-10-CM | POA: Insufficient documentation

## 2012-11-17 LAB — RAPID STREP SCREEN (MED CTR MEBANE ONLY): Streptococcus, Group A Screen (Direct): NEGATIVE

## 2012-11-17 MED ORDER — DIPHENHYDRAMINE HCL 50 MG/ML IJ SOLN
50.0000 mg | Freq: Once | INTRAMUSCULAR | Status: AC
  Administered 2012-11-17: 50 mg via INTRAMUSCULAR
  Filled 2012-11-17: qty 1

## 2012-11-17 MED ORDER — METOCLOPRAMIDE HCL 5 MG/ML IJ SOLN
10.0000 mg | Freq: Once | INTRAMUSCULAR | Status: AC
  Administered 2012-11-17: 10 mg via INTRAMUSCULAR
  Filled 2012-11-17: qty 2

## 2012-11-17 MED ORDER — OSELTAMIVIR PHOSPHATE 75 MG PO CAPS: 75.0000 mg | ORAL_CAPSULE | Freq: Two times a day (BID) | ORAL | Status: DC

## 2012-11-17 MED ORDER — ACETAMINOPHEN 500 MG PO TABS
1000.0000 mg | ORAL_TABLET | Freq: Once | ORAL | Status: AC
  Administered 2012-11-17: 1000 mg via ORAL
  Filled 2012-11-17: qty 2

## 2012-11-17 NOTE — ED Notes (Signed)
Pt reports being sick since Thursday w. Fever, cough and generalized body aches.

## 2012-11-17 NOTE — ED Notes (Signed)
Pt presents with generalized body aches, cough and sore throat x 3 days. Denies, N/V and diarrhea at this time.

## 2012-11-17 NOTE — ED Provider Notes (Signed)
History   This chart was scribed for Nicole Givens, MD by Nicole Gilbert, ED Scribe. The patient was seen in room [facility] and the patient's care was started at 12:57PM.    CSN: [id number]  Arrival date & time 11/17/12  1153   First MD Initiated Contact with Patient 11/17/12 1224      Chief Complaint  Patient presents with  . Fever  . Generalized Body Aches  . Cough    (Consider location/radiation/quality/duration/timing/severity/associated sxs/prior treatment) The history is provided by the patient. No language interpreter was used.   Nicole Gilbert is a 51 y.o. female who presents to the Emergency Department complaining of persistent, moderate  fever with associated holocranial headache, non-productive cough and generalized body aches, especially around her arms and hands, with an onset 2 days ago. She reports sore throatFever was 101 at home; it is 100.3 at the ED today. She has not been around any sick individuals at home but she works here at the hospital. Flu shot is up to date in October. She reports she aches "down to the bone".  Alka-Seltzer Plus and ibuprofen did not alleviate any of the symptoms. Denies rhinorrhea, abnormal sneezing, neck pain,rash, back pain, CP, SOB, abdominal pain, nausea, emesis, diarrhea, dysuria, or extremity edema, weakness, numbness, or tingling. No other pertinent medical symptoms. She is a former smoker.  PCP Dr Nicole Gilbert  Past Medical History  Diagnosis Date  . Anxiety   . Hemorrhoids 2006  . GERD (gastroesophageal reflux disease)   . Chronic constipation   . Hyperlipemia     Past Surgical History  Procedure Date  . Cesarean section     x2  . Vesicovaginal fistula closure w/ tah   . Hemorrhoid surgery 06/30/2008    Dr Nicole Gilbert  . Carpal tunnel right hand   . Knee arthroscopy     2010, RIGHT KNEE   . Esophagogastroduodenoscopy  01/20/2005    Rourk- Normal-appearing esophagus, status post passage of a 40 French Maloney  dilator/ small  HH/  Otherwise normal stomach  . Colonoscopy  05/19/2008    Nicole GilbertProminent internal hemorrhoids and anal papilla, otherwise normal appearing rectal mucosa / Colonic mucosa appeared normal except for melanosis coli  . Colonoscopy 08/22/2012    Procedure: COLONOSCOPY;  Surgeon: Nicole Ade, MD;  Location: [facility];  Service: Endoscopy;  Laterality: N/A;  8:30  . Abdominal hysterectomy     Family History  Problem Relation Age of Onset  . Colon cancer Brother 26    (paternal half brother)  . Diabetes Father   . Coronary artery disease Maternal Grandmother     History  Substance Use Topics  . Smoking status: Never Smoker   . Smokeless tobacco: Not on file  . Alcohol Use: No  employed at the hospital  OB History    Grav Para Term Preterm Abortions TAB SAB Ect Mult Living                  Review of Systems  Constitutional: Positive for fever.  Respiratory: Positive for cough.    10 Systems reviewed and all are negative for acute change except as noted in the HPI.   Allergies  Shellfish allergy  Home Medications   Current Outpatient Rx  Name  Route  Sig  Dispense  Refill  . ALPRAZOLAM 0.5 MG PO TABS   Oral   Take 0.5 mg by mouth daily as needed. Anxiety         . GABAPENTIN  100 MG PO CAPS   Oral   Take 300 mg by mouth at bedtime.          Marland Kitchen HYDROCODONE-ACETAMINOPHEN 5-325 MG PO TABS   Oral   Take 1 tablet by mouth every 6 (six) hours as needed for pain.   40 tablet   1   . CULTURELLE PO CAPS   Oral   Take 1 capsule by mouth daily.         . ADULT MULTIVITAMIN W/MINERALS CH   Oral   Take 1 tablet by mouth daily.         Marland Kitchen POLYETHYLENE GLYCOL 3350 PO POWD   Oral   Take 17 g by mouth daily as needed. Constipation            BP 138/92  Pulse 104  Temp 100.3 F (37.9 C)  Resp 20  SpO2 95%  Vital signs normal except low grade fever and tachycardia   Physical Exam  Nursing note and vitals reviewed. Constitutional: She is oriented  to person, place, and time. She appears well-developed and well-nourished.  Non-toxic appearance. She does not appear ill. No distress.       Looks like she feels bad  HENT:  Head: Normocephalic and atraumatic.  Right Ear: External ear normal.  Left Ear: External ear normal.  Nose: Nose normal. No mucosal edema or rhinorrhea.  Mouth/Throat: Oropharynx is clear and moist and mucous membranes are normal. No dental abscesses or uvula swelling. No oropharyngeal exudate.  Eyes: Conjunctivae normal and EOM are normal. Pupils are equal, round, and reactive to light.  Neck: Normal range of motion and full passive range of motion without pain. Neck supple.  Cardiovascular: Normal rate, regular rhythm and normal heart sounds.  Exam reveals no gallop and no friction rub.   No murmur heard. Pulmonary/Chest: Effort normal and breath sounds normal. No respiratory distress. She has no wheezes. She has no rhonchi. She has no rales. She exhibits no tenderness and no crepitus.  Abdominal: Soft. Normal appearance and bowel sounds are normal. She exhibits no distension. There is no tenderness. There is no rebound and no guarding.  Musculoskeletal: Normal range of motion. She exhibits no edema and no tenderness.       Moves all extremities well.   Neurological: She is alert and oriented to person, place, and time. She has normal strength. No cranial nerve deficit.  Skin: Skin is warm, dry and intact. No rash noted. No erythema. No pallor.  Psychiatric: Her speech is normal and behavior is normal. Her mood appears not anxious.       Flat affect    ED Course  Procedures (including critical care time)   Medications  metoCLOPramide (REGLAN) injection 10 mg (10 mg Intramuscular Given 11/17/12 1331)  diphenhydrAMINE (BENADRYL) injection 50 mg (50 mg Intramuscular Given 11/17/12 1331)  acetaminophen (TYLENOL) tablet 1,000 mg (1000 mg Oral Given 11/17/12 1331)     COORDINATION OF CARE:  1:02PM - CXR, rapid  strep screen, Reglan, benadryl, and tylenol will be ordered for Nicole Gilbert.   Feeling better, given results of tests and discharged discharge instructions  Results for orders placed during the hospital encounter of 11/17/12  RAPID STREP SCREEN      Component Value Range   Streptococcus, Group A Screen (Direct) NEGATIVE  NEGATIVE     Dg Chest 2 View  11/17/2012  *RADIOLOGY REPORT*  Clinical Data: Body aches, fever  CHEST - 2 VIEW  Comparison: Chest radiograph 03/02/2010  Findings: Normal mediastinum and heart silhouette.  Costophrenic angles are clear.  No effusion, infiltrate, or pneumothorax.  IMPRESSION: No acute cardiopulmonary process.   Original Report Authenticated By: Genevive Bi, M.D.      1. Influenza-like illness   2. Viral pharyngitis     New Prescriptions   OSELTAMIVIR (TAMIFLU) 75 MG CAPSULE    Take 1 capsule (75 mg total) by mouth every 12 (twelve) hours.    Plan discharge  Devoria Albe, MD, FACEP    MDM    I personally performed the services described in this documentation, which was scribed in my presence. The recorded information has been reviewed and considered.  Devoria Albe, MD, Armando Gang        Nicole Givens, MD 11/17/12 1435

## 2013-01-18 ENCOUNTER — Other Ambulatory Visit: Payer: Self-pay

## 2013-01-18 ENCOUNTER — Emergency Department (HOSPITAL_COMMUNITY)
Admission: EM | Admit: 2013-01-18 | Discharge: 2013-01-18 | Disposition: A | Discharge status: Home / Self Care | Payer: 59 | Attending: Emergency Medicine | Admitting: Emergency Medicine

## 2013-01-18 ENCOUNTER — Emergency Department (HOSPITAL_COMMUNITY): Payer: 59

## 2013-01-18 ENCOUNTER — Encounter (HOSPITAL_COMMUNITY): Payer: Self-pay | Admitting: *Deleted

## 2013-01-18 DIAGNOSIS — R5381 Other malaise: Secondary | ICD-10-CM | POA: Insufficient documentation

## 2013-01-18 DIAGNOSIS — R079 Chest pain, unspecified: Secondary | ICD-10-CM | POA: Insufficient documentation

## 2013-01-18 DIAGNOSIS — R109 Unspecified abdominal pain: Secondary | ICD-10-CM | POA: Insufficient documentation

## 2013-01-18 DIAGNOSIS — Z8719 Personal history of other diseases of the digestive system: Secondary | ICD-10-CM | POA: Insufficient documentation

## 2013-01-18 DIAGNOSIS — K219 Gastro-esophageal reflux disease without esophagitis: Secondary | ICD-10-CM | POA: Insufficient documentation

## 2013-01-18 DIAGNOSIS — Z79899 Other long term (current) drug therapy: Secondary | ICD-10-CM | POA: Insufficient documentation

## 2013-01-18 DIAGNOSIS — F411 Generalized anxiety disorder: Secondary | ICD-10-CM | POA: Insufficient documentation

## 2013-01-18 DIAGNOSIS — E785 Hyperlipidemia, unspecified: Secondary | ICD-10-CM | POA: Insufficient documentation

## 2013-01-18 DIAGNOSIS — R55 Syncope and collapse: Secondary | ICD-10-CM | POA: Insufficient documentation

## 2013-01-18 LAB — CBC WITH DIFFERENTIAL/PLATELET
Eosinophils Relative: 2 % (ref 0–5)
HCT: 36.5 % (ref 36.0–46.0)
LUCs, %: 0 % (ref 0–4)
Lymphocytes Relative: 67 % — ABNORMAL HIGH (ref 12–46)
Lymphs Abs: 4.6 10*3/uL — ABNORMAL HIGH (ref 0.7–4.0)
MCHC: 33.7 g/dL (ref 30.0–36.0)
Myelocytes: 0 %
Neutrophils Relative %: 23 % — ABNORMAL LOW (ref 43–77)
Other 2: 0 %
Platelets: 271 10*3/uL (ref 150–400)
Promyelocytes Absolute: 0 %
RDW: 14.1 % (ref 11.5–15.5)
WBC: 6.8 10*3/uL (ref 4.0–10.5)
nRBC: 0 /100 WBC

## 2013-01-18 LAB — GLUCOSE, CAPILLARY: Glucose-Capillary: 108 mg/dL — ABNORMAL HIGH (ref 70–99)

## 2013-01-18 LAB — COMPREHENSIVE METABOLIC PANEL
ALT: 12 U/L (ref 0–35)
AST: 18 U/L (ref 0–37)
CO2: 26 mEq/L (ref 19–32)
Calcium: 9.1 mg/dL (ref 8.4–10.5)
Potassium: 3.3 mEq/L — ABNORMAL LOW (ref 3.5–5.1)
Sodium: 139 mEq/L (ref 135–145)
Total Protein: 6.9 g/dL (ref 6.0–8.3)

## 2013-01-18 MED ORDER — ONDANSETRON HCL 4 MG/2ML IJ SOLN
4.0000 mg | Freq: Once | INTRAMUSCULAR | Status: AC
  Administered 2013-01-18: 4 mg via INTRAVENOUS
  Filled 2013-01-18: qty 2

## 2013-01-18 MED ORDER — SODIUM CHLORIDE 0.9 % IV BOLUS (SEPSIS)
500.0000 mL | Freq: Once | INTRAVENOUS | Status: AC
  Administered 2013-01-18: via INTRAVENOUS

## 2013-01-18 NOTE — ED Notes (Signed)
Pt reports sudden onset dizziness.  Also reporting nausea and abdominal pain intermittently all afternoon. BS checked, results 108.

## 2013-01-18 NOTE — ED Provider Notes (Signed)
History     CSN: [id number]  Arrival date & time 01/18/13  0012   First MD Initiated Contact with Patient 01/18/13 0013      Chief Complaint  Patient presents with  . Shortness of Breath  . Dizziness    (Consider location/radiation/quality/duration/timing/severity/associated sxs/prior treatment) Patient is a 52 y.o. female presenting with weakness. The history is provided by the patient (the pt states she has been having some stomach problems today and then she was in the bathroom and felt weak and almost passed out.  she also has had some chest paint).  Weakness This is a new problem. The current episode started 3 to 5 hours ago. The problem occurs constantly. The problem has been gradually improving. Associated symptoms include chest pain and abdominal pain. Pertinent negatives include no headaches. Nothing aggravates the symptoms. Nothing relieves the symptoms. She has tried nothing for the symptoms. The treatment provided mild relief.    Past Medical History  Diagnosis Date  . Anxiety   . Hemorrhoids 2006  . GERD (gastroesophageal reflux disease)   . Chronic constipation   . Hyperlipemia     Past Surgical History  Procedure Laterality Date  . Cesarean section      x2  . Vesicovaginal fistula closure w/ tah    . Hemorrhoid surgery  06/30/2008    Dr Lovell Sheehan  . Carpal tunnel right hand    . Knee arthroscopy      2010, RIGHT KNEE   . Esophagogastroduodenoscopy   01/20/2005    Rourk- Normal-appearing esophagus, status post passage of a 33 French Maloney  dilator/ small HH/  Otherwise normal stomach  . 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 Colonoscopy  08/22/2012    Procedure: COLONOSCOPY;  Surgeon: Corbin Ade, MD;  Location: [facility];  Service: Endoscopy;  Laterality: N/A;  8:30  . Abdominal hysterectomy      Family  History  Problem Relation Age of Onset  . Colon cancer Brother 58    (paternal half brother)  . Diabetes Father   . Coronary artery disease Maternal Grandmother     History  Substance Use Topics  . Smoking status: Never Smoker   . Smokeless tobacco: Not on file  . Alcohol Use: No    OB History   Grav Para Term Preterm Abortions TAB SAB Ect Mult Living                  Review of Systems  Constitutional: Negative for fatigue.  HENT: Negative for congestion, sinus pressure and ear discharge.   Eyes: Negative for discharge.  Respiratory: Negative for cough.   Cardiovascular: Positive for chest pain.  Gastrointestinal: Positive for abdominal pain. Negative for diarrhea.  Genitourinary: Negative for frequency and hematuria.  Musculoskeletal: Negative for back pain.  Skin: Negative for rash.  Neurological: Positive for weakness. Negative for seizures and headaches.  Psychiatric/Behavioral: Negative for hallucinations.    Allergies  Shellfish allergy  Home Medications   Current Outpatient Rx  Name  Route  Sig  Dispense  Refill  . acetaminophen (TYLENOL) 325 MG tablet   Oral   Take 650 mg by mouth every 6 (six) hours as needed. For pain         . ALPRAZolam (XANAX) 0.5 MG tablet   Oral   Take 0.5 mg by mouth daily as needed. Anxiety         .  gabapentin (NEURONTIN) 100 MG capsule   Oral   Take 300 mg by mouth at bedtime as needed. For muscle/nerve pain         . HYDROcodone-acetaminophen (NORCO/VICODIN) 5-325 MG per tablet   Oral   Take 1 tablet by mouth every 6 (six) hours as needed for pain.   40 tablet   1   . ibuprofen (ADVIL,MOTRIN) 200 MG tablet   Oral   Take 400 mg by mouth every 6 (six) hours as needed. For pain         . oseltamivir (TAMIFLU) 75 MG capsule   Oral   Take 1 capsule (75 mg total) by mouth every 12 (twelve) hours.   10 capsule   0   . Phenyleph-CPM-DM-Aspirin (ALKA-SELTZER PLUS COLD & COUGH PO)   Oral   Take 1 tablet by  mouth daily as needed. For cough/cold           BP 111/56  Pulse 83  Temp(Src) 97.6 F (36.4 C) (Oral)  SpO2 99%  Physical Exam  Constitutional: She is oriented to person, place, and time. She appears well-developed.  HENT:  Head: Normocephalic and atraumatic.  Eyes: Conjunctivae and EOM are normal. No scleral icterus.  Neck: Neck supple. No thyromegaly present.  Cardiovascular: Normal rate and regular rhythm.  Exam reveals no gallop and no friction rub.   No murmur heard. Pulmonary/Chest: No stridor. She has no wheezes. She has no rales. She exhibits no tenderness.  Abdominal: She exhibits no distension. There is no tenderness. There is no rebound.  Musculoskeletal: Normal range of motion. She exhibits no edema.  Lymphadenopathy:    She has no cervical adenopathy.  Neurological: She is oriented to person, place, and time. Coordination normal.  Skin: No rash noted. No erythema.  Psychiatric: She has a normal mood and affect. Her behavior is normal.    ED Course  Procedures (including critical care time)  Labs Reviewed  CBC WITH DIFFERENTIAL - Abnormal; Notable for the following:    MCV 77.5 (*)    Neutro Abs 1.6 (*)    Lymphs Abs 4.6 (*)    Neutrophils Relative 23 (*)    Lymphocytes Relative 67 (*)    All other components within normal limits  COMPREHENSIVE METABOLIC PANEL - Abnormal; Notable for the following:    Potassium 3.3 (*)    Glucose, Bld 148 (*)    Albumin 3.4 (*)    All other components within normal limits  TROPONIN I  D-DIMER, QUANTITATIVE   Dg Chest Portable 1 View  01/18/2013  *RADIOLOGY REPORT*  Clinical Data: Shortness of breath and dizziness.  Near-syncope.  PORTABLE CHEST - 1 VIEW  Comparison: Chest radiograph performed 11/17/2012  Findings: The lungs are well-aerated and appear grossly clear. There is no evidence of focal opacification, pleural effusion or pneumothorax.  The cardiomediastinal silhouette is within normal limits.  No acute osseous  abnormalities are seen.  IMPRESSION: No acute cardiopulmonary process seen.   Original Report Authenticated By: Tonia Ghent, M.D.      1. Near syncope      Date: 01/18/2013  Rate: 88  Rhythm: normal sinus rhythm  QRS Axis: normal  Intervals: normal  ST/T Wave abnormalities: normal  Conduction Disutrbances:none  Narrative Interpretation:   Old EKG Reviewed: none available    MDM  Chest pain for weeks,  Normal ekg and troponin.  Pt to follow up with her md.     Near syncope most likely viral and decrease food today  Benny Lennert, MD 01/18/13 [phone]

## 2013-01-21 ENCOUNTER — Encounter: Payer: Self-pay | Admitting: Gastroenterology

## 2013-01-21 ENCOUNTER — Ambulatory Visit (INDEPENDENT_AMBULATORY_CARE_PROVIDER_SITE_OTHER): Payer: 59 | Admitting: Gastroenterology

## 2013-01-21 VITALS — BP 119/78 | HR 66 | Temp 97.1°F | Ht 61.0 in | Wt 154.8 lb

## 2013-01-21 DIAGNOSIS — K219 Gastro-esophageal reflux disease without esophagitis: Secondary | ICD-10-CM

## 2013-01-21 DIAGNOSIS — R1032 Left lower quadrant pain: Secondary | ICD-10-CM

## 2013-01-21 MED ORDER — PANTOPRAZOLE SODIUM 40 MG PO TBEC: 40.0000 mg | DELAYED_RELEASE_TABLET | Freq: Every day | ORAL | Status: DC

## 2013-01-21 MED ORDER — PREDNISONE 50 MG PO TABS: ORAL_TABLET | ORAL | Status: DC

## 2013-01-21 NOTE — Progress Notes (Signed)
Faxed to PCP

## 2013-01-21 NOTE — Assessment & Plan Note (Signed)
New onset, with belching, epigastric discomfort, retrosternal discomfort. Negative for MI in ED. Needs PPI, limit Ibuprofen use. Last EGD in 2006 by Dr. Jena Gauss with normal esophagus, s/p empiric dilation. Start PPI. If no improvement in next week or so, consider EGD. The risks, benefits, and alternatives were discussed in detail with the patient. She is willing to proceed if necessary.

## 2013-01-21 NOTE — Progress Notes (Signed)
Referring Provider: Fredirick Maudlin, MD Primary Care Physician:  Fredirick Maudlin, MD Primary Gastroenterologist: Dr. Jena Gauss   Chief Complaint  Patient presents with  . Abdominal Pain    x 1 month    HPI:   [name] is a pleasant 52 year old female who presents today as an ED referral secondary to abdominal pain. She was last seen by our office in August 2013, and she underwent a colonoscopy by Dr. Jena Gauss as outlined below. She will be due for surveillance in 2018 (FH of colon cancer and personal hx of polyps). 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  Notes Thursday morning had eggs, apple, felt funny. Ate a banana that evening, went to work. Felt like she may faint. Ate a bagel, couldn't get it down, felt woozy. Had a small bowel movement. +belching. Notes retrosternal discomfort/reflux , epigastric discomfort, whole left side discomfort. Notes LLQ discomfort. Symptoms X 1 week. Denies bloating. +nausea, mild. +early satiety. No melena. Ibuprofen 800 mg almost daily. No PPI currently. Has taken Nexium and Prilosec in the past. No dysphagia.   BMs were regular prior to this. Small BM this morning, early.    Pain wraps around to left flank. LLQ hurts the worst.    Recent LFTs normal, no anemia.   Past Medical History  Diagnosis Date  . Anxiety   . Hemorrhoids 2006  . GERD (gastroesophageal reflux disease)   . Chronic constipation   . Hyperlipemia     Past Surgical History  Procedure Laterality Date  . Cesarean section      x2  . Hemorrhoid surgery  06/30/2008    Dr Lovell Sheehan  . Carpal tunnel right hand    . Knee arthroscopy      2010, RIGHT KNEE   . Esophagogastroduodenoscopy   01/20/2005    Rourk- Normal-appearing esophagus, status post passage of a 49 French Maloney  dilator/ small HH/  Otherwise normal stomach  . Colonoscopy   05/19/2008    Rourk-Prominent internal hemorrhoids and anal papilla, otherwise normal appearing rectal mucosa / Colonic mucosa appeared normal except for  melanosis coli  . Colonoscopy  08/22/2012    Rourk- hyperplastic polyps and tubular adenoma, query infectious colitis vs NSAID effect  . Abdominal hysterectomy      Current Outpatient Prescriptions  Medication Sig Dispense Refill  . acetaminophen (TYLENOL) 325 MG tablet Take 650 mg by mouth every 6 (six) hours as needed. For pain      . fish oil-omega-3 fatty acids 1000 MG capsule Take 2 g by mouth daily.      Marland Kitchen gabapentin (NEURONTIN) 100 MG capsule Take 300 mg by mouth at bedtime as needed. For muscle/nerve pain      . HYDROcodone-acetaminophen (NORCO/VICODIN) 5-325 MG per tablet Take 1 tablet by mouth every 6 (six) hours as needed for pain.  40 tablet  1  . ibuprofen (ADVIL,MOTRIN) 200 MG tablet Take 400 mg by mouth every 6 (six) hours as needed. For pain      . Multiple Vitamins-Minerals (MULTIVITAMIN WITH MINERALS) tablet Take 1 tablet by mouth daily.      . Probiotic Product (PROBIOTIC DAILY PO) Take by mouth.      . pantoprazole (PROTONIX) 40 MG tablet Take 1 tablet (40 mg total) by mouth daily.  31 tablet  3  . predniSONE (DELTASONE) 50 MG tablet Take 1 tablet 13 hours before scan, 1 tablet 7 hours before scan, and 1 tablet 1 hour before scan.  3 tablet  0  No current facility-administered medications for this visit.    Allergies as of 01/21/2013 - Review Complete 01/21/2013  Allergen Reaction Noted  . Shellfish allergy Hives and Itching 08/13/2012    Family History  Problem Relation Age of Onset  . Colon cancer Brother 34    (paternal half brother)  . Diabetes Father   . Coronary artery disease Maternal Grandmother     History   Social History  . Marital Status: Single    Spouse Name: N/A    Number of Children: 2  . Years of Education: N/A   Occupational History  . nursing assistant, [employer]   .     Social History Main Topics  . Smoking status: Never Smoker   . Smokeless tobacco: None  . Alcohol Use: No  . Drug Use: No  . Sexually Active: Yes    Birth  Control/ Protection: None   Other Topics Concern  . None   Social History Narrative  . None    Review of Systems: Gen: SEE HPI CV: +chest pain  Resp: Denies dyspnea at rest, cough, wheezing, coughing up blood, and pleurisy. GI: SEE HPI GU: denies any urinary burning, frequency, odor Derm: Denies rash, itching, dry skin Psych: Denies depression, anxiety, memory loss, confusion. No homicidal or suicidal ideation.  Heme: Denies bruising, bleeding, and enlarged lymph nodes.  Physical Exam: BP 119/78  Pulse 66  Temp(Src) 97.1 F (36.2 C) (Oral)  Ht 5\' 1"  (1.549 m)  Wt 154 lb 12.8 oz (70.217 kg)  BMI 29.26 kg/m2 General:   Alert and oriented. No distress noted. Pleasant and cooperative.  Head:  Normocephalic and atraumatic. Eyes:  Conjuctiva clear without scleral icterus. Mouth:  Oral mucosa pink and moist. Good dentition. No lesions. Neck:  Supple, without mass or thyromegaly. Heart:  S1, S2 present without murmurs, rubs, or gallops. Regular rate and rhythm. Abdomen:  +BS, soft, very mild TTP LLQ and non-distended. No rebound or guarding. No HSM or masses noted. +left CVA tenderness. Msk:  Symmetrical without gross deformities. Normal posture. Extremities:  Without edema. Neurologic:  Alert and  oriented x4;  grossly normal neurologically. Skin:  Intact without significant lesions or rashes. Cervical Nodes:  No significant cervical adenopathy. Psych:  Alert and cooperative. Normal mood and affect.

## 2013-01-21 NOTE — Assessment & Plan Note (Signed)
52 year old with LLQ abdominal discomfort, left flank discomfort and CVA tenderness on exam. No urinary symptoms. No hx of diverticulosis, and recent colonoscopy is on file. Hx of constipation, now with recurrent symptoms. She has a recent colonoscopy on file as of Sept 2013, due for surveillance 2018. Her symptoms are somewhat vague, and she may simply be dealing with abdominal discomfort secondary to bowel habits. However, we will proceed with CT abd/pelvis, specifically due to findings on physical exam. UA with culture ordered as well. Pre-medication discussed with patient due to hx of shellfish allergy.

## 2013-01-21 NOTE — Patient Instructions (Addendum)
Please complete the urinalysis. We will call you with the results.  We have also set you up for a CT scan of your abdomen. Because you are allergic to shellfish, you will need to take Prednisone and Benadryl as follows:  Prednisone 50 mg tablet at 13 hours before scan Prednisone 50 mg at 7 hours before scan Prednisone 50 mg at 1 hour before scan, along with Benadryl 50 mg.  The Benadryl may make you sleepy. Please have a driver for your safety.  Also, start taking Protonix each morning, 30 minutes before breakfast. This is for reflux.

## 2013-01-22 LAB — URINALYSIS W MICROSCOPIC + REFLEX CULTURE
Crystals: NONE SEEN
Leukocytes, UA: NEGATIVE
Nitrite: NEGATIVE
Specific Gravity, Urine: 1.019 (ref 1.005–1.030)
Urobilinogen, UA: 0.2 mg/dL (ref 0.0–1.0)

## 2013-01-23 ENCOUNTER — Ambulatory Visit (HOSPITAL_COMMUNITY)
Admission: RE | Admit: 2013-01-23 | Discharge: 2013-01-23 | Disposition: A | Discharge status: Home / Self Care | Payer: 59 | Source: Ambulatory Visit | Attending: Gastroenterology | Admitting: Gastroenterology

## 2013-01-23 DIAGNOSIS — R911 Solitary pulmonary nodule: Secondary | ICD-10-CM | POA: Insufficient documentation

## 2013-01-23 DIAGNOSIS — R1032 Left lower quadrant pain: Secondary | ICD-10-CM | POA: Insufficient documentation

## 2013-01-23 DIAGNOSIS — R110 Nausea: Secondary | ICD-10-CM | POA: Insufficient documentation

## 2013-01-23 MED ORDER — IOHEXOL 300 MG/ML  SOLN
100.0000 mL | Freq: Once | INTRAMUSCULAR | Status: AC | PRN
  Administered 2013-01-23: 100 mL via INTRAVENOUS

## 2013-01-28 ENCOUNTER — Telehealth: Payer: Self-pay

## 2013-01-28 MED ORDER — CIPROFLOXACIN HCL 250 MG PO TABS: 250.0000 mg | ORAL_TABLET | Freq: Two times a day (BID) | ORAL | Status: DC

## 2013-01-28 NOTE — Progress Notes (Signed)
Pt called in with an update; she is not improved with a PPI. We can proceed with an upper endoscopy due to new-onset dyspepsia. I had discussed this possibility with her at the appointment.  Continue to take PPI. Avoid Ibuprofen. I am going to send in short course of Cipro for ?UTI.

## 2013-01-28 NOTE — Progress Notes (Signed)
Quick Note:  Called and informed pt. She said she is still having a lot of belching and a lot of gas. She is very bloated. She is drinking lots of water, but having to urinate every 45 min. She said basically, she is not different than when she was here. Please advise! ______

## 2013-01-28 NOTE — Telephone Encounter (Signed)
See my results notes. Thanks!

## 2013-01-28 NOTE — Progress Notes (Signed)
Quick Note:  Called and informed pt. ______ 

## 2013-01-28 NOTE — Telephone Encounter (Signed)
Pt aware.

## 2013-01-28 NOTE — Progress Notes (Signed)
Quick Note:  Called home. Could not leave VM. Called mobile and [facility] for a return call. ______

## 2013-01-28 NOTE — Progress Notes (Signed)
Quick Note:  We can proceed with an upper endoscopy due to new-onset dyspepsia. I had discussed this possibility with her at the appointment.  Continue to take PPI. Avoid Ibuprofen. I am going to send in short course of Cipro for ?UTI.   ______

## 2013-01-28 NOTE — Progress Notes (Signed)
Quick Note:  Everything looked normal on CT.  HOWEVER, there is a right lung nodule and left lung nodule. I suggest a chest CT in 12 months as recommended. Please cc to PCP. Her urinalysis showed a little bit of white blood cells; this may have been contaminated.  How is she doing?  I may empirically treat for possible UTI. Just need update on her first. ______

## 2013-01-28 NOTE — Telephone Encounter (Signed)
Pt left VM that she would like to get results of her CT. She can be reached at [phone] / [phone].

## 2013-01-29 ENCOUNTER — Encounter (HOSPITAL_COMMUNITY): Payer: Self-pay | Admitting: Pharmacy Technician

## 2013-01-29 ENCOUNTER — Other Ambulatory Visit: Payer: Self-pay | Admitting: Internal Medicine

## 2013-01-29 DIAGNOSIS — R1013 Epigastric pain: Secondary | ICD-10-CM

## 2013-01-29 NOTE — Progress Notes (Signed)
Patient is scheduled with RMR on Wednesday March 5th at 8:45 and I have mailed her instructions and she is aware

## 2013-02-06 ENCOUNTER — Encounter (HOSPITAL_COMMUNITY)
Admission: RE | Disposition: A | Discharge status: Home / Self Care | Payer: Self-pay | Source: Ambulatory Visit | Attending: Internal Medicine

## 2013-02-06 ENCOUNTER — Encounter (HOSPITAL_COMMUNITY): Payer: Self-pay | Admitting: *Deleted

## 2013-02-06 ENCOUNTER — Ambulatory Visit (HOSPITAL_COMMUNITY)
Admission: RE | Admit: 2013-02-06 | Discharge: 2013-02-06 | Disposition: A | Discharge status: Home / Self Care | Payer: 59 | Source: Ambulatory Visit | Attending: Internal Medicine | Admitting: Internal Medicine

## 2013-02-06 DIAGNOSIS — K3189 Other diseases of stomach and duodenum: Secondary | ICD-10-CM | POA: Insufficient documentation

## 2013-02-06 DIAGNOSIS — K2960 Other gastritis without bleeding: Secondary | ICD-10-CM

## 2013-02-06 DIAGNOSIS — K449 Diaphragmatic hernia without obstruction or gangrene: Secondary | ICD-10-CM

## 2013-02-06 DIAGNOSIS — R1013 Epigastric pain: Secondary | ICD-10-CM

## 2013-02-06 DIAGNOSIS — K2940 Chronic atrophic gastritis without bleeding: Secondary | ICD-10-CM | POA: Insufficient documentation

## 2013-02-06 HISTORY — PX: ESOPHAGOGASTRODUODENOSCOPY: SHX5428

## 2013-02-06 SURGERY — EGD (ESOPHAGOGASTRODUODENOSCOPY)
Anesthesia: Moderate Sedation

## 2013-02-06 MED ORDER — MIDAZOLAM HCL 5 MG/5ML IJ SOLN
INTRAMUSCULAR | Status: AC
  Filled 2013-02-06: qty 10

## 2013-02-06 MED ORDER — ONDANSETRON HCL 4 MG/2ML IJ SOLN
INTRAMUSCULAR | Status: AC
  Filled 2013-02-06: qty 2

## 2013-02-06 MED ORDER — MEPERIDINE HCL 100 MG/ML IJ SOLN
INTRAMUSCULAR | Status: DC | PRN
  Administered 2013-02-06: 25 mg via INTRAVENOUS
  Administered 2013-02-06: 50 mg via INTRAVENOUS

## 2013-02-06 MED ORDER — SODIUM CHLORIDE 0.45 % IV SOLN
INTRAVENOUS | Status: DC
  Administered 2013-02-06: 1000 mL via INTRAVENOUS

## 2013-02-06 MED ORDER — MEPERIDINE HCL 100 MG/ML IJ SOLN
INTRAMUSCULAR | Status: AC
  Filled 2013-02-06: qty 2

## 2013-02-06 MED ORDER — BUTAMBEN-TETRACAINE-BENZOCAINE 2-2-14 % EX AERO
INHALATION_SPRAY | CUTANEOUS | Status: DC | PRN
  Administered 2013-02-06: 1 via TOPICAL

## 2013-02-06 MED ORDER — MIDAZOLAM HCL 5 MG/5ML IJ SOLN
INTRAMUSCULAR | Status: DC | PRN
  Administered 2013-02-06: 1 mg via INTRAVENOUS
  Administered 2013-02-06: 2 mg via INTRAVENOUS

## 2013-02-06 MED ORDER — ONDANSETRON HCL 4 MG/2ML IJ SOLN
INTRAMUSCULAR | Status: DC | PRN
  Administered 2013-02-06: 4 mg via INTRAVENOUS

## 2013-02-06 NOTE — H&P (View-Only) (Signed)
Referring Provider: Hawkins, Edward L, MD Primary Care Physician:  HAWKINS,EDWARD L, MD Primary Gastroenterologist: Dr. Rourk   Chief Complaint  Patient presents with  . Abdominal Pain    x 1 month    HPI:   Nicole Gilbert is a pleasant 52-year-old female who presents today as an ED referral secondary to abdominal pain. She was last seen by our office in August 2013, and she underwent a colonoscopy by Dr. Rourk as outlined below. She will be due for surveillance in 2018 (FH of colon cancer and personal hx of polyps). 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  Notes Thursday morning had eggs, apple, felt funny. Ate a banana that evening, went to work. Felt like she may faint. Ate a bagel, couldn't get it down, felt woozy. Had a small bowel movement. +belching. Notes retrosternal discomfort/reflux , epigastric discomfort, whole left side discomfort. Notes LLQ discomfort. Symptoms X 1 week. Denies bloating. +nausea, mild. +early satiety. No melena. Ibuprofen 800 mg almost daily. No PPI currently. Has taken Nexium and Prilosec in the past. No dysphagia.   BMs were regular prior to this. Small BM this morning, early.    Pain wraps around to left flank. LLQ hurts the worst.    Recent LFTs normal, no anemia.   Past Medical History  Diagnosis Date  . Anxiety   . Hemorrhoids 2006  . GERD (gastroesophageal reflux disease)   . Chronic constipation   . Hyperlipemia     Past Surgical History  Procedure Laterality Date  . Cesarean section      x2  . Hemorrhoid surgery  06/30/2008    Dr Jenkins  . Carpal tunnel right hand    . Knee arthroscopy      2010, RIGHT KNEE   . Esophagogastroduodenoscopy   01/20/2005    Rourk- Normal-appearing esophagus, status post passage of a 56 French Maloney  dilator/ small HH/  Otherwise normal stomach  . Colonoscopy   05/19/2008    Rourk-Prominent internal hemorrhoids and anal papilla, otherwise normal appearing rectal mucosa / Colonic mucosa appeared normal except for  melanosis coli  . Colonoscopy  08/22/2012    Rourk- hyperplastic polyps and tubular adenoma, query infectious colitis vs NSAID effect  . Abdominal hysterectomy      Current Outpatient Prescriptions  Medication Sig Dispense Refill  . acetaminophen (TYLENOL) 325 MG tablet Take 650 mg by mouth every 6 (six) hours as needed. For pain      . fish oil-omega-3 fatty acids 1000 MG capsule Take 2 g by mouth daily.      . gabapentin (NEURONTIN) 100 MG capsule Take 300 mg by mouth at bedtime as needed. For muscle/nerve pain      . HYDROcodone-acetaminophen (NORCO/VICODIN) 5-325 MG per tablet Take 1 tablet by mouth every 6 (six) hours as needed for pain.  40 tablet  1  . ibuprofen (ADVIL,MOTRIN) 200 MG tablet Take 400 mg by mouth every 6 (six) hours as needed. For pain      . Multiple Vitamins-Minerals (MULTIVITAMIN WITH MINERALS) tablet Take 1 tablet by mouth daily.      . Probiotic Product (PROBIOTIC DAILY PO) Take by mouth.      . pantoprazole (PROTONIX) 40 MG tablet Take 1 tablet (40 mg total) by mouth daily.  31 tablet  3  . predniSONE (DELTASONE) 50 MG tablet Take 1 tablet 13 hours before scan, 1 tablet 7 hours before scan, and 1 tablet 1 hour before scan.  3 tablet  0     No current facility-administered medications for this visit.    Allergies as of 01/21/2013 - Review Complete 01/21/2013  Allergen Reaction Noted  . Shellfish allergy Hives and Itching 08/13/2012    Family History  Problem Relation Age of Onset  . Colon cancer Brother 52    (paternal half brother)  . Diabetes Father   . Coronary artery disease Maternal Grandmother     History   Social History  . Marital Status: Single    Spouse Name: N/A    Number of Children: 2  . Years of Education: N/A   Occupational History  . nursing assistant, [employer]   .     Social History Main Topics  . Smoking status: Never Smoker   . Smokeless tobacco: None  . Alcohol Use: No  . Drug Use: No  . Sexually Active: Yes    Birth  Control/ Protection: None   Other Topics Concern  . None   Social History Narrative  . None    Review of Systems: Gen: SEE HPI CV: +chest pain  Resp: Denies dyspnea at rest, cough, wheezing, coughing up blood, and pleurisy. GI: SEE HPI GU: denies any urinary burning, frequency, odor Derm: Denies rash, itching, dry skin Psych: Denies depression, anxiety, memory loss, confusion. No homicidal or suicidal ideation.  Heme: Denies bruising, bleeding, and enlarged lymph nodes.  Physical Exam: BP 119/78  Pulse 66  Temp(Src) 97.1 F (36.2 C) (Oral)  Ht 5' 1" (1.549 m)  Wt 154 lb 12.8 oz (70.217 kg)  BMI 29.26 kg/m2 General:   Alert and oriented. No distress noted. Pleasant and cooperative.  Head:  Normocephalic and atraumatic. Eyes:  Conjuctiva clear without scleral icterus. Mouth:  Oral mucosa pink and moist. Good dentition. No lesions. Neck:  Supple, without mass or thyromegaly. Heart:  S1, S2 present without murmurs, rubs, or gallops. Regular rate and rhythm. Abdomen:  +BS, soft, very mild TTP LLQ and non-distended. No rebound or guarding. No HSM or masses noted. +left CVA tenderness. Msk:  Symmetrical without gross deformities. Normal posture. Extremities:  Without edema. Neurologic:  Alert and  oriented x4;  grossly normal neurologically. Skin:  Intact without significant lesions or rashes. Cervical Nodes:  No significant cervical adenopathy. Psych:  Alert and cooperative. Normal mood and affect.  

## 2013-02-06 NOTE — Interval H&P Note (Signed)
History and Physical Interval Note:  02/06/2013 9:02 AM  Nicole Gilbert  has presented today for surgery, with the diagnosis of New Onset Dyspepsia  The various methods of treatment have been discussed with the patient and family. After consideration of risks, benefits and other options for treatment, the patient has consented to  Procedure(s) with comments: ESOPHAGOGASTRODUODENOSCOPY (EGD) (N/A) - 8:45 as a surgical intervention .  The patient's history has been reviewed, patient examined, no change in status, stable for surgery.  I have reviewed the patient's chart and labs.  Questions were answered to the patient's satisfaction.     Nicole Gilbert  EGD today per plan.The risks, benefits, limitations, alternatives and imponderables have been reviewed with the patient. Potential for esophageal dilation, biopsy, etc. have also been reviewed.  Questions have been answered. All parties agreeable.

## 2013-02-06 NOTE — Op Note (Signed)
[facility] [address] [place], [address]   ENDOSCOPY PROCEDURE REPORT  PATIENT: Nicole Gilbert, Nicole Gilbert  MR#: [id number] BIRTHDATE: March 27, 1961 , 51  yrs. old GENDER: Female ENDOSCOPIST: R.  Roetta Sessions, MD FACP [facility] REFERRED BY:  Kari Baars, M.D. PROCEDURE DATE:  02/06/2013 PROCEDURE:     EGD with gastric biopsy  INDICATIONS:     New onset dyspepsia;  negative CT as far as abdomen is concerned.  INFORMED CONSENT:   The risks, benefits, limitations, alternatives and imponderables have been discussed.  The potential for biopsy, esophogeal dilation, etc. have also been reviewed.  Questions have been answered.  All parties agreeable.  Please see the history and physical in the medical record for more information.  MEDICATIONS:   Versed 3 mg IV and Demerol 75 mg IV in divided doses. Zofran 4 mg IV. Cetacaine spray.  DESCRIPTION OF PROCEDURE:   The EG-[id number] ([id number])  endoscope was introduced through the mouth and advanced to the second portion of the duodenum without difficulty or limitations.  The mucosal surfaces were surveyed very carefully during advancement of the scope and upon withdrawal.  Retroflexion view of the proximal stomach and esophagogastric junction was performed.      FINDINGS:   Normal esophagus. Stomach empty. Small hiatal hernia. Antral erosions. No ulcer or infiltrating process. Patent pylorus. Normal first and second portion of the duodenum  THERAPEUTIC / DIAGNOSTIC MANEUVERS PERFORMED:  Biopsies the abnormal antrum taken for histologic study   COMPLICATIONS:  None  IMPRESSION:   Normal esophagus. Small hiatal hernia.  Antral erosions - status post biopsy.  RECOMMENDATIONS:  Followup on pathology. Further recommendations to follow. As a separate issue, Followup with Dr. Juanetta Gosling regarding pulmonary nodule seen on recent CT scan. This was discussed with patient and multiple family members  today.    _______________________________ R. Roetta Sessions, MD FACP [facility] eSigned:  R. Roetta Sessions, MD FACP [facility] 02/06/2013 9:59 AM     CC:

## 2013-02-10 ENCOUNTER — Encounter: Payer: Self-pay | Admitting: Internal Medicine

## 2013-02-11 ENCOUNTER — Encounter: Payer: Self-pay | Admitting: *Deleted

## 2013-02-12 ENCOUNTER — Encounter (HOSPITAL_COMMUNITY): Payer: Self-pay | Admitting: Internal Medicine

## 2013-03-01 ENCOUNTER — Encounter: Payer: Self-pay | Admitting: Internal Medicine

## 2013-03-05 ENCOUNTER — Ambulatory Visit: Payer: Self-pay | Admitting: Gastroenterology

## 2013-03-14 ENCOUNTER — Other Ambulatory Visit: Payer: Self-pay | Admitting: *Deleted

## 2013-03-14 DIAGNOSIS — M1710 Unilateral primary osteoarthritis, unspecified knee: Secondary | ICD-10-CM

## 2013-03-14 MED ORDER — HYDROCODONE-ACETAMINOPHEN 5-325 MG PO TABS: 1.0000 | ORAL_TABLET | Freq: Four times a day (QID) | ORAL | Status: DC | PRN

## 2013-07-14 ENCOUNTER — Emergency Department (HOSPITAL_COMMUNITY): Payer: 59

## 2013-07-14 ENCOUNTER — Observation Stay (HOSPITAL_COMMUNITY)
Admission: EM | Admit: 2013-07-14 | Discharge: 2013-07-15 | Disposition: A | Discharge status: Home / Self Care | Payer: 59 | Attending: Pulmonary Disease | Admitting: Pulmonary Disease

## 2013-07-14 ENCOUNTER — Encounter (HOSPITAL_COMMUNITY): Payer: Self-pay | Admitting: Emergency Medicine

## 2013-07-14 DIAGNOSIS — R1032 Left lower quadrant pain: Secondary | ICD-10-CM

## 2013-07-14 DIAGNOSIS — Z800 Family history of malignant neoplasm of digestive organs: Secondary | ICD-10-CM

## 2013-07-14 DIAGNOSIS — R110 Nausea: Secondary | ICD-10-CM

## 2013-07-14 DIAGNOSIS — IMO0002 Reserved for concepts with insufficient information to code with codable children: Secondary | ICD-10-CM

## 2013-07-14 DIAGNOSIS — M1710 Unilateral primary osteoarthritis, unspecified knee: Secondary | ICD-10-CM

## 2013-07-14 DIAGNOSIS — Z8719 Personal history of other diseases of the digestive system: Secondary | ICD-10-CM

## 2013-07-14 DIAGNOSIS — M549 Dorsalgia, unspecified: Secondary | ICD-10-CM

## 2013-07-14 DIAGNOSIS — K649 Unspecified hemorrhoids: Secondary | ICD-10-CM

## 2013-07-14 DIAGNOSIS — M25469 Effusion, unspecified knee: Secondary | ICD-10-CM

## 2013-07-14 DIAGNOSIS — M659 Synovitis and tenosynovitis, unspecified: Secondary | ICD-10-CM

## 2013-07-14 DIAGNOSIS — K219 Gastro-esophageal reflux disease without esophagitis: Secondary | ICD-10-CM

## 2013-07-14 DIAGNOSIS — R42 Dizziness and giddiness: Principal | ICD-10-CM | POA: Diagnosis present

## 2013-07-14 DIAGNOSIS — R634 Abnormal weight loss: Secondary | ICD-10-CM

## 2013-07-14 DIAGNOSIS — F411 Generalized anxiety disorder: Secondary | ICD-10-CM | POA: Insufficient documentation

## 2013-07-14 DIAGNOSIS — R1310 Dysphagia, unspecified: Secondary | ICD-10-CM

## 2013-07-14 DIAGNOSIS — R51 Headache: Secondary | ICD-10-CM | POA: Insufficient documentation

## 2013-07-14 DIAGNOSIS — K5909 Other constipation: Secondary | ICD-10-CM

## 2013-07-14 DIAGNOSIS — F489 Nonpsychotic mental disorder, unspecified: Secondary | ICD-10-CM

## 2013-07-14 LAB — POCT I-STAT TROPONIN I: Troponin i, poc: 0.03 ng/mL (ref 0.00–0.08)

## 2013-07-14 LAB — CBC WITH DIFFERENTIAL/PLATELET
Eosinophils Absolute: 0.1 10*3/uL (ref 0.0–0.7)
HCT: 38 % (ref 36.0–46.0)
Hemoglobin: 12.6 g/dL (ref 12.0–15.0)
Lymphs Abs: 3.9 10*3/uL (ref 0.7–4.0)
MCH: 26.1 pg (ref 26.0–34.0)
MCHC: 33.2 g/dL (ref 30.0–36.0)
MCV: 78.8 fL (ref 78.0–100.0)
Monocytes Absolute: 0.5 10*3/uL (ref 0.1–1.0)
Monocytes Relative: 8 % (ref 3–12)
Neutrophils Relative %: 25 % — ABNORMAL LOW (ref 43–77)
RBC: 4.82 MIL/uL (ref 3.87–5.11)

## 2013-07-14 LAB — URINALYSIS, ROUTINE W REFLEX MICROSCOPIC
Bilirubin Urine: NEGATIVE
Glucose, UA: NEGATIVE mg/dL
Hgb urine dipstick: NEGATIVE
Protein, ur: NEGATIVE mg/dL
Specific Gravity, Urine: 1.025 (ref 1.005–1.030)

## 2013-07-14 LAB — BASIC METABOLIC PANEL
BUN: 14 mg/dL (ref 6–23)
CO2: 27 mEq/L (ref 19–32)
Chloride: 102 mEq/L (ref 96–112)
GFR calc non Af Amer: 81 mL/min — ABNORMAL LOW (ref >90)
Glucose, Bld: 133 mg/dL — ABNORMAL HIGH (ref 70–99)
Potassium: 3.6 mEq/L (ref 3.5–5.1)
Sodium: 139 mEq/L (ref 135–145)

## 2013-07-14 MED ORDER — ONDANSETRON HCL 4 MG/2ML IJ SOLN
4.0000 mg | Freq: Four times a day (QID) | INTRAMUSCULAR | Status: DC | PRN
  Administered 2013-07-14: 4 mg via INTRAVENOUS
  Filled 2013-07-14: qty 2

## 2013-07-14 MED ORDER — OMEGA-3-ACID ETHYL ESTERS 1 G PO CAPS
2.0000 g | ORAL_CAPSULE | Freq: Every day | ORAL | Status: DC
  Administered 2013-07-14 – 2013-07-15 (×2): 2 g via ORAL
  Filled 2013-07-14 (×2): qty 2

## 2013-07-14 MED ORDER — ONDANSETRON HCL 4 MG PO TABS
4.0000 mg | ORAL_TABLET | Freq: Four times a day (QID) | ORAL | Status: DC | PRN
  Administered 2013-07-14: 4 mg via ORAL
  Filled 2013-07-14: qty 1

## 2013-07-14 MED ORDER — OMEGA-3 FATTY ACIDS 1000 MG PO CAPS: 2.0000 g | ORAL_CAPSULE | Freq: Every day | ORAL | Status: DC

## 2013-07-14 MED ORDER — SODIUM CHLORIDE 0.9 % IV SOLN
INTRAVENOUS | Status: DC
  Administered 2013-07-14 (×2): via INTRAVENOUS

## 2013-07-14 MED ORDER — LORAZEPAM 2 MG/ML IJ SOLN
1.0000 mg | Freq: Once | INTRAMUSCULAR | Status: AC
  Administered 2013-07-14: 1 mg via INTRAVENOUS
  Filled 2013-07-14: qty 1

## 2013-07-14 MED ORDER — FENTANYL CITRATE 0.05 MG/ML IJ SOLN
50.0000 ug | Freq: Once | INTRAMUSCULAR | Status: AC
  Administered 2013-07-14: 50 ug via INTRAVENOUS
  Filled 2013-07-14: qty 2

## 2013-07-14 MED ORDER — HYDROCODONE-ACETAMINOPHEN 5-325 MG PO TABS
1.0000 | ORAL_TABLET | Freq: Four times a day (QID) | ORAL | Status: DC | PRN
  Administered 2013-07-14 – 2013-07-15 (×3): 1 via ORAL
  Filled 2013-07-14 (×3): qty 1

## 2013-07-14 MED ORDER — SODIUM CHLORIDE 0.9 % IJ SOLN
3.0000 mL | Freq: Two times a day (BID) | INTRAMUSCULAR | Status: DC
  Administered 2013-07-15: 3 mL via INTRAVENOUS

## 2013-07-14 MED ORDER — HEPARIN SODIUM (PORCINE) 5000 UNIT/ML IJ SOLN
5000.0000 [IU] | Freq: Three times a day (TID) | INTRAMUSCULAR | Status: DC
  Administered 2013-07-14 (×2): 5000 [IU] via SUBCUTANEOUS
  Filled 2013-07-14 (×2): qty 1

## 2013-07-14 MED ORDER — ONDANSETRON HCL 4 MG/2ML IJ SOLN
4.0000 mg | Freq: Once | INTRAMUSCULAR | Status: AC
  Administered 2013-07-14: 4 mg via INTRAVENOUS
  Filled 2013-07-14: qty 2

## 2013-07-14 MED ORDER — ACETAMINOPHEN 325 MG PO TABS
650.0000 mg | ORAL_TABLET | Freq: Four times a day (QID) | ORAL | Status: DC | PRN
  Administered 2013-07-15: 650 mg via ORAL
  Filled 2013-07-14: qty 2

## 2013-07-14 MED ORDER — PANTOPRAZOLE SODIUM 40 MG PO TBEC
40.0000 mg | DELAYED_RELEASE_TABLET | Freq: Every day | ORAL | Status: DC
  Administered 2013-07-14 – 2013-07-15 (×2): 40 mg via ORAL
  Filled 2013-07-14 (×2): qty 1

## 2013-07-14 MED ORDER — LORAZEPAM 1 MG PO TABS
1.0000 mg | ORAL_TABLET | Freq: Once | ORAL | Status: DC
  Filled 2013-07-14: qty 1

## 2013-07-14 MED ORDER — ADULT MULTIVITAMIN W/MINERALS CH
1.0000 | ORAL_TABLET | Freq: Every day | ORAL | Status: DC
  Administered 2013-07-14 – 2013-07-15 (×2): 1 via ORAL
  Filled 2013-07-14 (×2): qty 1

## 2013-07-14 NOTE — H&P (Signed)
[facility] History and Physical  Nicole Gilbert ZOX:[id number] DOB: January 31, 1961 DOA: 07/14/2013  Referring physician: Dr. Rulon Abide, ER physician. PCP: Fredirick Maudlin, MD    Chief Complaint: Dizziness.  HPI: Nicole Gilbert is a 52 y.o. 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 She tells me that she became lightheaded, felt clammy, had palpitations and felt her heart racing. She did not lose consciousness. She was able to get up from when she was sitting and go to the bathroom, she fell she wanted to open her bowels but was unable to do so. At no time did she have any chest pain or dyspnea. She denied any significant abdominal pain. She has had no fever. She does describe that she has been trying to lose weight. Typically, she will eat 1 egg for breakfast and then throughout the day she has 2 shakes composed of water, spinach and berries. She does not have any other meals at all. Also, she received some bright bad news yesterday-her daughter's mother-in-law was sick and apparently had a cardiorespiratory arrest, the outcome is unknown.   Review of Systems Apart from history of present illness, other systems negative.  Past Medical History  Diagnosis Date  . Anxiety   . Hemorrhoids 2006  . GERD (gastroesophageal reflux disease)   . Chronic constipation   . Hyperlipemia    Past Surgical History  Procedure Laterality Date  . Cesarean section      x2  . Hemorrhoid surgery  06/30/2008    Dr Lovell Sheehan  . Carpal tunnel right hand    . Knee arthroscopy      2010, RIGHT KNEE   . Esophagogastroduodenoscopy   01/20/2005    Rourk- Normal-appearing esophagus, status post passage of a 90 French Maloney  dilator/ small HH/  Otherwise normal stomach  . 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  Colonoscopy  08/22/2012    Rourk- hyperplastic polyps and tubular adenoma, query infectious colitis vs NSAID effect  . Abdominal hysterectomy    . Esophagogastroduodenoscopy N/A 02/06/2013    WJX:BJYNWG esophagus. Small hiatal hernia   Social History:  She is single and lives alone. She does not smoke cigarettes. She occasionally drinks alcohol.   Allergies  Allergen Reactions  . Shellfish Allergy Hives and Itching    Family History  Problem Relation Age of Onset  . Colon cancer Brother 43    (paternal half brother)  . Diabetes Father   . Coronary artery disease Maternal Grandmother       Prior to Admission medications   Medication Sig Start Date End Date Taking? Authorizing Provider  acetaminophen (TYLENOL) 325 MG tablet Take 650 mg by mouth every 6 (six) hours as needed. For pain    Historical Provider, MD  ciprofloxacin (CIPRO) 250 MG tablet Take 1 tablet (250 mg total) by mouth 2 (two) times daily. For 3 days 01/28/13   Nira Retort, NP  fish oil-omega-3 fatty acids 1000 MG capsule Take 2 g by mouth daily.    Historical Provider, MD  HYDROcodone-acetaminophen (NORCO/VICODIN) 5-325 MG per tablet Take 1 tablet by mouth every 6 (six) hours as needed for pain. 03/14/13   Vickki Hearing, MD  ibuprofen (ADVIL,MOTRIN) 200 MG tablet Take 400 mg by mouth every 6 (six) hours as needed. For pain    Historical  Provider, MD  Multiple Vitamins-Minerals (MULTIVITAMIN WITH MINERALS) tablet Take 1 tablet by mouth daily.    Historical Provider, MD  pantoprazole (PROTONIX) 40 MG tablet Take 1 tablet (40 mg total) by mouth daily. 01/21/13   Nira Retort, NP  Probiotic Product (PROBIOTIC DAILY PO) Take 1 tablet by mouth daily.     Historical Provider, MD   Physical Exam: Filed Vitals:   07/14/13 0700  BP: 116/69  Pulse: 91  Temp:   Resp: 17     General:  She looks systemically well.  Eyes: No pallor. No jaundice.  ENT: No abnormalities.  Neck: No lymphadenopathy.  Cardiovascular: Heart  sounds are present without murmurs or added sounds.  Respiratory: Lung fields are clear.  Abdomen: Soft, nontender. No masses. No hepatosplenomegaly.  Skin: No rashes.  Musculoskeletal: No acute joint abnormalities.  Psychiatric: Appropriate affect.  Neurologic: Alert and orientated without any focal neurological signs.  Labs on Admission:  Basic Metabolic Panel:  Recent Labs Lab 07/14/13 0415  NA 139  K 3.6  CL 102  CO2 27  GLUCOSE 133*  BUN 14  CREATININE 0.81  CALCIUM 9.5       CBC:  Recent Labs Lab 07/14/13 0415  WBC 6.0  NEUTROABS 1.5*  HGB 12.6  HCT 38.0  MCV 78.8  PLT 264   Cardiac Enzymes: No results found for this basename: CKTOTAL, CKMB, CKMBINDEX, TROPONINI,  in the last 168 hours  BNP (last 3 results) No results found for this basename: PROBNP,  in the last 8760 hours CBG: No results found for this basename: GLUCAP,  in the last 168 hours  Radiological Exams on Admission: Ct Head Wo Contrast  07/14/2013   *RADIOLOGY REPORT*  Clinical Data: Sudden onset of lightheadedness and nausea.  CT HEAD WITHOUT CONTRAST  Technique:  Contiguous axial images were obtained from the base of the skull through the vertex without contrast.  Comparison: CT of the head performed 09/18/2009  Findings: There is no evidence of acute infarction, mass lesion, or intra- or extra-axial hemorrhage on CT.  The posterior fossa, including the cerebellum, brainstem and fourth ventricle, is within normal limits.  The third and lateral ventricles, and basal ganglia are unremarkable in appearance.  The cerebral hemispheres are symmetric in appearance, with normal gray- white differentiation.  No mass effect or midline shift is seen.  There is no evidence of fracture; visualized osseous structures are unremarkable in appearance.  The visualized portions of the orbits are within normal limits.  The paranasal sinuses and mastoid air cells are well-aerated.  No significant soft tissue  abnormalities are seen.  IMPRESSION: Unremarkable noncontrast CT of the head.   Original Report Authenticated By: Tonia Ghent, M.D.    EKG: Independently reviewed. Normal sinus rhythm without any acute ST-T wave changes.  Assessment/Plan   1. Dizziness, I suspect is related to her poor nutrition intake, lack of calories. 2. Anxiety with recent exacerbation related to bad news.  Plan: 1. Admit to telemetry floor. IV fluids. 2. Serial cardiac enzymes to make sure there is no evidence of cardiac ischemia or infarction. Further recommendations will depend on patient's hospital progress.  Code Status: Full code.   Family Communication: Discussed plan with patient at the bedside.   Disposition Plan: Home when medically stable.   Time spent: 30 minutes.  Wilson Singer [facility] Pager [phone].  If 7PM-7AM, please contact night-coverage [email or web address] Password [facility] 07/14/2013, 7:48 AM

## 2013-07-14 NOTE — ED Notes (Signed)
Pt states she still feels little headed, and tired. Headache and body hurting all over.

## 2013-07-14 NOTE — ED Provider Notes (Signed)
CSN: [id number]     Arrival date & time 07/14/13  0243 History     First MD Initiated Contact with Patient 07/14/13 0305     Chief Complaint  Patient presents with  . Nausea  . 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  Patient was sitting down, no history of Valsalva,  and she had sudden sensation of nausea. Blood pressure at that time was 89/60 checked by a coworker.  Patient's also been having history of recent headaches for the last 2 weeks. They're severe, sometimes associated with nausea.  Patient has history of anxiety, got a phone call with bad news this evening, she thinks it may be anxiety secondary to this phone call.  No chest pain, no shortness of breath.  She's had a sensation of rapid heartbeat.   Past Medical History  Diagnosis Date  . Anxiety   . Hemorrhoids 2006  . GERD (gastroesophageal reflux disease)   . Chronic constipation   . Hyperlipemia    Past Surgical History  Procedure Laterality Date  . Cesarean section      x2  . Hemorrhoid surgery  06/30/2008    Dr Lovell Sheehan  . Carpal tunnel right hand    . Knee arthroscopy      2010, RIGHT KNEE   . Esophagogastroduodenoscopy   01/20/2005    Rourk- Normal-appearing esophagus, status post passage of a 14 French Maloney  dilator/ small HH/  Otherwise normal stomach  . 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 Colonoscopy  08/22/2012    Rourk- hyperplastic polyps and tubular adenoma, query infectious colitis vs NSAID effect  . Abdominal hysterectomy    . Esophagogastroduodenoscopy N/A 02/06/2013    WUJ:WJXBJY esophagus. Small hiatal hernia   Family History  Problem Relation Age of Onset  . Colon cancer Brother 64    (paternal half brother)  . Diabetes Father   .  Coronary artery disease Maternal Grandmother    History  Substance Use Topics  . Smoking status: Never Smoker   . Smokeless tobacco: Not on file  . Alcohol Use: No   OB History   Grav Para Term Preterm Abortions TAB SAB Ect Mult Living                 Review of Systems At least 10pt or greater review of systems completed and are negative except where specified in the HPI.  Allergies  Shellfish allergy  Home Medications   Current Outpatient Rx  Name  Route  Sig  Dispense  Refill  . acetaminophen (TYLENOL) 325 MG tablet   Oral   Take 650 mg by mouth every 6 (six) hours as needed. For pain         . ciprofloxacin (CIPRO) 250 MG tablet   Oral   Take 1 tablet (250 mg total) by mouth 2 (two) times daily. For 3 days   6 tablet   0   . fish oil-omega-3 fatty acids 1000 MG capsule   Oral   Take 2 g by mouth daily.         Marland Kitchen gabapentin (NEURONTIN) 100 MG capsule   Oral   Take 300 mg by mouth at bedtime as needed. For muscle/nerve pain         .  HYDROcodone-acetaminophen (NORCO/VICODIN) 5-325 MG per tablet   Oral   Take 1 tablet by mouth every 6 (six) hours as needed for pain.   40 tablet   5   . ibuprofen (ADVIL,MOTRIN) 200 MG tablet   Oral   Take 400 mg by mouth every 6 (six) hours as needed. For pain         . Multiple Vitamins-Minerals (MULTIVITAMIN WITH MINERALS) tablet   Oral   Take 1 tablet by mouth daily.         . pantoprazole (PROTONIX) 40 MG tablet   Oral   Take 1 tablet (40 mg total) by mouth daily.   31 tablet   3   . Probiotic Product (PROBIOTIC DAILY PO)   Oral   Take 1 tablet by mouth daily.           BP 125/89  Pulse 76  Temp(Src) 97.9 F (36.6 C) (Oral)  Resp 20  Ht 5\' 1"  (1.549 m)  Wt 154 lb (69.854 kg)  BMI 29.11 kg/m2  SpO2 99% Physical Exam  Nursing notes reviewed.  Electronic medical record reviewed. VITAL SIGNS:   Filed Vitals:   07/14/13 0247  BP: 125/89  Pulse: 76  Temp: 97.9 F (36.6 C)  TempSrc: Oral   Resp: 20  Height: 5\' 1"  (1.549 m)  Weight: 154 lb (69.854 kg)  SpO2: 99%   CONSTITUTIONAL: Awake, oriented, appears non-toxic HENT: Atraumatic, normocephalic, oral mucosa pink and moist, airway patent. Nares patent without drainage. External ears normal. EYES: Conjunctiva clear, EOMI, PERRLA NECK: Trachea midline, non-tender, supple CARDIOVASCULAR: Normal heart rate, Normal rhythm, No murmurs, rubs, gallops PULMONARY/CHEST: Clear to auscultation, no rhonchi, wheezes, or rales. Symmetrical breath sounds. Non-tender. ABDOMINAL: Non-distended, soft, non-tender - no rebound or guarding.  BS normal. NEUROLOGIC: Non-focal, moving all four extremities, no gross sensory or motor deficits. EXTREMITIES: No clubbing, cyanosis, or edema SKIN: Warm, Dry, No erythema, No rash  ED Course   Procedures (including critical care time)  Date: 07/14/2013  Rate: 83  Rhythm: normal sinus rhythm  QRS Axis: normal  Intervals: normal  ST/T Wave abnormalities: normal  Conduction Disutrbances: none  Narrative Interpretation: Normal sinus rhythm, no significant change from prior  Labs Reviewed  BASIC METABOLIC PANEL - Abnormal; Notable for the following:    Glucose, Bld 133 (*)    GFR calc non Af Amer 81 (*)    All other components within normal limits  CBC WITH DIFFERENTIAL - Abnormal; Notable for the following:    Neutrophils Relative % 25 (*)    Neutro Abs 1.5 (*)    Lymphocytes Relative 65 (*)    All other components within normal limits  URINALYSIS, ROUTINE W REFLEX MICROSCOPIC - Abnormal; Notable for the following:    APPearance HAZY (*)    All other components within normal limits  CBC  CREATININE, SERUM  TROPONIN I  TROPONIN I  TROPONIN I  POCT I-STAT TROPONIN I   Ct Head Wo Contrast  07/14/2013   *RADIOLOGY REPORT*  Clinical Data: Sudden onset of lightheadedness and nausea.  CT HEAD WITHOUT CONTRAST  Technique:  Contiguous axial images were obtained from the base of the skull through  the vertex without contrast.  Comparison: CT of the head performed 09/18/2009  Findings: There is no evidence of acute infarction, mass lesion, or intra- or extra-axial hemorrhage on CT.  The posterior fossa, including the cerebellum, brainstem and fourth ventricle, is within normal limits.  The third and lateral ventricles, and basal ganglia  are unremarkable in appearance.  The cerebral hemispheres are symmetric in appearance, with normal gray- white differentiation.  No mass effect or midline shift is seen.  There is no evidence of fracture; visualized osseous structures are unremarkable in appearance.  The visualized portions of the orbits are within normal limits.  The paranasal sinuses and mastoid air cells are well-aerated.  No significant soft tissue abnormalities are seen.  IMPRESSION: Unremarkable noncontrast CT of the head.   Original Report Authenticated By: Tonia Ghent, M.D.   1. Dizziness   2. Anxiety state, unspecified     MDM  Patient with hypotensive episode, dizziness, and headaches for 2 weeks.  Clinical presentation is vague and could include dehydration, poor nutrition, stress, anxiety, however I think arrhythmia or coronary problems remain on the differential. Laboratory workup is essentially unremarkable. EKG is a normal sinus rhythm.  Will admit patient for dizziness.  Admitted in stable condition.  Lonsway Skene, MD 07/14/13 [phone]

## 2013-07-14 NOTE — Progress Notes (Signed)
Pt is complaining of headache and stomach cramps. However refuses to take any more medication. Offered prune juice, ice pack, or heat pack. Patient refused.

## 2013-07-14 NOTE — ED Notes (Signed)
Patient complained of sudden onset of feeling clammy, lightheaded, nauseous. Reported was sitting down and suddenly felt as if she had to vomit. Blood pressure of 89/60

## 2013-07-14 NOTE — ED Notes (Signed)
Patient ambulatory to restroom with assistance, clean catch instructions given and advised pt to bring specimen back to room as well. Pt is complaining of nausea and feeling clammy

## 2013-07-14 NOTE — ED Notes (Signed)
Dr.Gosrani paged for pt needing admission.

## 2013-07-15 LAB — COMPREHENSIVE METABOLIC PANEL
Alkaline Phosphatase: 62 U/L (ref 39–117)
BUN: 8 mg/dL (ref 6–23)
GFR calc Af Amer: >90 mL/min (ref >90)
Glucose, Bld: 93 mg/dL (ref 70–99)
Potassium: 4.1 mEq/L (ref 3.5–5.1)
Total Bilirubin: 0.6 mg/dL (ref 0.3–1.2)
Total Protein: 6.8 g/dL (ref 6.0–8.3)

## 2013-07-15 LAB — CBC
HCT: 36.3 % (ref 36.0–46.0)
Platelets: 251 10*3/uL (ref 150–400)
RDW: 14.4 % (ref 11.5–15.5)
WBC: 3.9 10*3/uL — ABNORMAL LOW (ref 4.0–10.5)

## 2013-07-15 NOTE — Progress Notes (Signed)
UR Chart Review Completed  

## 2013-07-15 NOTE — Progress Notes (Signed)
Discussed home medications with pt. She said she takes two conjugated linoleic acid supplement pills per day. She takes them as a part of her weight loss regimen. She stated she didn't previously tell the physician about them but now would like the treatment team to be aware.

## 2013-07-15 NOTE — Progress Notes (Signed)
Pt discharged home in stable condition.  IV removed WNL.  Pt. educated on discharge instructions of a heart healthy diet and given handout.  Pt. Has no questions at this time.  Pt. Left unit in wheelchair and taken downstairs by NT.

## 2013-07-15 NOTE — Discharge Summary (Signed)
Physician Discharge Summary  Nicole Gilbert XBJ:[id number] DOB: [birth year] DOA: 07/14/2013  PCP: Fredirick Maudlin, MD  Admit date: 07/14/2013 Discharge date: 07/15/2013  Time spent: Greater than 30 minutes  Recommendations for Outpatient Follow-up:  1. Follow up primary care physician, Dr. Juanetta Gosling.   Discharge Diagnoses:  1. Dizziness. No evidence of significant pathology. No evidence of myocardial infarction. 2. Anxiety. 3. Headache, nonspecific. CT head scan unremarkable.   Discharge Condition: Stable and improved.  Diet recommendation: Regular.  Filed Weights   07/14/13 0247  Weight: 69.854 kg (154 lb)    History of present illness:  This 52 year old lady presented to the hospital with symptoms of lightheadedness, dizziness. 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 She tells me that she became lightheaded, felt clammy, had palpitations and felt her heart racing. She did not lose consciousness. She was able to get up from when she was sitting and go to the bathroom, she fell she wanted to open her bowels but was unable to do so. At no time did she have any chest pain or dyspnea. She denied any significant abdominal pain. She has had no fever. She does describe that she has been trying to lose weight. Typically, she will eat 1 egg for breakfast and then throughout the day she has 2 shakes composed of water, spinach and berries. She does not have any other meals at all.  Also, she received some bright bad news yesterday-her daughter's mother-in-law was sick and apparently had a cardiorespiratory arrest, the outcome is unknown.  Hospital Course:  The patient was admitted overnight and serial cardiac enzymes were negative. She was given intravenous fluids. Her dizziness is improved. She has a headache at the present time which is mostly frontal in nature. She  has no other neurological symptoms. CT scan of the brain was unremarkable/normal. ECG is normal. No acute ST-T wave changes. She is now cleared for discharge. She will need to follow with her primary care physician, Dr. Juanetta Gosling.  Procedures:  None.  Consultations:  None.  Discharge Exam: Filed Vitals:   07/15/13 0608  BP: 118/76  Pulse: 62  Temp: 97.9 F (36.6 C)  Resp: 18    General: She looks systemically well. Cardiovascular: Heart sounds are present without murmurs or added sounds. Respiratory: Lung fields are clear. She is alert and oriented without any focal neurological signs.  Discharge Instructions  Discharge Orders   Future Orders Complete By Expires     Diet - low sodium heart healthy  As directed     Increase activity slowly  As directed         Medication List    STOP taking these medications       acetaminophen 325 MG tablet  Commonly known as:  TYLENOL      TAKE these medications       fish oil-omega-3 fatty acids 1000 MG capsule  Take 2 g by mouth daily.     gabapentin 100 MG capsule  Commonly known as:  NEURONTIN  Take 300 mg by mouth as needed (muscle/nerve pain).     HYDROcodone-acetaminophen 5-325 MG per tablet  Commonly known as:  NORCO/VICODIN  Take 1 tablet by mouth every 6 (six) hours as needed for pain.     ibuprofen 200 MG tablet  Commonly known as:  ADVIL,MOTRIN  Take 400 mg by mouth every  6 (six) hours as needed. For pain     multivitamin with minerals tablet  Take 1 tablet by mouth daily.     pantoprazole 40 MG tablet  Commonly known as:  PROTONIX  Take 1 tablet (40 mg total) by mouth daily.     PROBIOTIC DAILY PO  Take 1 tablet by mouth daily.       Allergies  Allergen Reactions  . Shellfish Allergy Hives and Itching      The results of significant diagnostics from this hospitalization (including imaging, microbiology, ancillary and laboratory) are listed below for reference.    Significant Diagnostic  Studies: Ct Head Wo Contrast  07/14/2013   *RADIOLOGY REPORT*  Clinical Data: Sudden onset of lightheadedness and nausea.  CT HEAD WITHOUT CONTRAST  Technique:  Contiguous axial images were obtained from the base of the skull through the vertex without contrast.  Comparison: CT of the head performed 09/18/2009  Findings: There is no evidence of acute infarction, mass lesion, or intra- or extra-axial hemorrhage on CT.  The posterior fossa, including the cerebellum, brainstem and fourth ventricle, is within normal limits.  The third and lateral ventricles, and basal ganglia are unremarkable in appearance.  The cerebral hemispheres are symmetric in appearance, with normal gray- white differentiation.  No mass effect or midline shift is seen.  There is no evidence of fracture; visualized osseous structures are unremarkable in appearance.  The visualized portions of the orbits are within normal limits.  The paranasal sinuses and mastoid air cells are well-aerated.  No significant soft tissue abnormalities are seen.  IMPRESSION: Unremarkable noncontrast CT of the head.   Original Report Authenticated By: Tonia Ghent, M.D.        Labs: Basic Metabolic Panel:  Recent Labs Lab 07/14/13 0415 07/15/13 0625  NA 139 140  K 3.6 4.1  CL 102 107  CO2 27 27  GLUCOSE 133* 93  BUN 14 8  CREATININE 0.81 0.80  CALCIUM 9.5 8.7   Liver Function Tests:  Recent Labs Lab 07/15/13 0625  AST 21  ALT 15  ALKPHOS 62  BILITOT 0.6  PROT 6.8  ALBUMIN 3.1*     CBC:  Recent Labs Lab 07/14/13 0415 07/15/13 0625  WBC 6.0 3.9*  NEUTROABS 1.5*  --   HGB 12.6 11.8*  HCT 38.0 36.3  MCV 78.8 79.4  PLT 264 251   Cardiac Enzymes:  Recent Labs Lab 07/14/13 1343 07/14/13 1952  TROPONINI <0.30 <0.30        Signed:  Lilly Cove C  [facility] 07/15/2013, 10:08 AM

## 2013-07-16 ENCOUNTER — Telehealth: Payer: Self-pay | Admitting: Internal Medicine

## 2013-07-16 NOTE — Telephone Encounter (Signed)
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 She passed out at work this past Saturday and was released yesterday. Pt has episodes of abd pain that comes and goes. She hasnt had a bm since Saturday and is concerned she is constipated. She does have some gas.She has not taken anything because she is afraid to take anything without asking first. Please advise.

## 2013-07-16 NOTE — Telephone Encounter (Signed)
Pt called to say that she had been discharged from the hospital a few days ago and is still having problems with her stomach cramping. She said it felt like labor contractions and wanted to be seen ASAP. I told her that our first available would be around Sept 9 or 10. I asked her if they did a GI consult while she was at the hospital and she said, No. Please advise and call patient at [phone].

## 2013-07-19 NOTE — Telephone Encounter (Signed)
Pt is aware of OV on 9/17 at 8 per LAW

## 2013-07-19 NOTE — Telephone Encounter (Signed)
Agree; patient needs next available appointment with extender

## 2013-07-19 NOTE — Telephone Encounter (Signed)
Tried to call pt- LMOM Pt needs ov

## 2013-08-21 ENCOUNTER — Ambulatory Visit (INDEPENDENT_AMBULATORY_CARE_PROVIDER_SITE_OTHER): Payer: 59 | Admitting: Gastroenterology

## 2013-08-21 ENCOUNTER — Encounter: Payer: Self-pay | Admitting: Gastroenterology

## 2013-08-21 VITALS — BP 120/76 | HR 66 | Temp 97.2°F | Ht 61.0 in | Wt 154.0 lb

## 2013-08-21 DIAGNOSIS — R109 Unspecified abdominal pain: Secondary | ICD-10-CM | POA: Insufficient documentation

## 2013-08-21 DIAGNOSIS — K5909 Other constipation: Secondary | ICD-10-CM

## 2013-08-21 MED ORDER — LINACLOTIDE 290 MCG PO CAPS: 290.0000 ug | ORAL_CAPSULE | Freq: Every day | ORAL | Status: DC

## 2013-08-21 NOTE — Progress Notes (Signed)
Primary Care Physician: Fredirick Maudlin, MD  Primary Gastroenterologist:  Roetta Sessions, MD   Chief Complaint  Patient presents with  . Abdominal Pain    HPI: Nicole Gilbert is a 52 y.o. female here for abdominal pain. Last seen in 01/2013.   Admitted August 10 or the 11th 2014 for anxiety, dizziness, headache. Patient states she had abdominal pain around that time as well but GI consult was not requested. Describes colicky abdominal pain for a total of 10 days or so. Felt like labor pains. States after discharge about one week later she presented to the ER at [facility] for abdominal pain and headaches. She reports negative CT scan at that time.   Still having problems with bowels. Miralax tid for awhile. Used to have 3 BMs per day but stopped having BMs. For last few weeks, going every day but not complete. Not much coming out. Soft stool. No melena, brbpr. Now on Miralax once daily. No abdominal pain in the past three weeks. Gets full off eating a little. No heartburn. Taking very little Advil, prn headache. 800mg  once every two weeks.    Review records via care everywhere. 07/20/2013, hemoglobin 13.2. ALT 25. Lipase 36. Glucose 95. BUN 11. Creatinine 0.73. Total bilirubin 1. Alkaline phosphatase 59. AST 22. ALT 25. White blood cell count 4100. MCV 78.3. Platelets 297,000.  CT of the abdomen pelvis with IV contrast was unremarkable.  Current Outpatient Prescriptions  Medication Sig Dispense Refill  . escitalopram (LEXAPRO) 10 MG tablet Take 5 mg by mouth daily.       . fish oil-omega-3 fatty acids 1000 MG capsule Take 2 g by mouth daily.      Marland Kitchen ibuprofen (ADVIL,MOTRIN) 200 MG tablet Take 400 mg by mouth every 6 (six) hours as needed. For pain      . Multiple Vitamins-Minerals (MULTIVITAMIN WITH MINERALS) tablet Take 1 tablet by mouth daily.      . polyethylene glycol (MIRALAX / GLYCOLAX) packet Take 17 g by mouth daily.      . Probiotic Product (PROBIOTIC DAILY PO) Take 1 tablet by mouth  daily.        No current facility-administered medications for this visit.    Allergies as of 08/21/2013 - Review Complete 08/21/2013  Allergen Reaction Noted  . Shellfish allergy Hives and Itching 08/13/2012    ROS:  General: Negative for anorexia, weight loss, fever, chills, fatigue, weakness. ENT: Negative for hoarseness, difficulty swallowing , nasal congestion. CV: Negative for chest pain, angina, palpitations, dyspnea on exertion, peripheral edema.  Respiratory: Negative for dyspnea at rest, dyspnea on exertion, cough, sputum, wheezing.  GI: See history of present illness. GU:  Negative for dysuria, hematuria, urinary incontinence, urinary frequency, nocturnal urination.  Endo: Negative for unusual weight change.    Physical Examination:   BP 120/76  Pulse 66  Temp(Src) 97.2 F (36.2 C) (Oral)  Ht 5\' 1"  (1.549 m)  Wt 154 lb (69.854 kg)  BMI 29.11 kg/m2  General: Well-nourished, well-developed in no acute distress.  Eyes: No icterus. Mouth: Oropharyngeal mucosa moist and pink , no lesions erythema or exudate. Lungs: Clear to auscultation bilaterally.  Heart: Regular rate and rhythm, no murmurs rubs or gallops.  Abdomen: Bowel sounds are normal, nontender, nondistended, no hepatosplenomegaly or masses, no abdominal bruits or hernia , no rebound or guarding.   Extremities: No lower extremity edema. No clubbing or deformities. Neuro: Alert and oriented x 4   Skin: Warm and dry, no jaundice.   Psych: Alert  and cooperative, normal mood and affect.  Labs:  Lab Results  Component Value Date   WBC 3.9* 07/15/2013   HGB 11.8* 07/15/2013   HCT 36.3 07/15/2013   MCV 79.4 07/15/2013   PLT 251 07/15/2013   Lab Results  Component Value Date   CREATININE 0.80 07/15/2013   BUN 8 07/15/2013   NA 140 07/15/2013   K 4.1 07/15/2013   CL 107 07/15/2013   CO2 27 07/15/2013   Lab Results  Component Value Date   ALT 15 07/15/2013   AST 21 07/15/2013   ALKPHOS 62 07/15/2013   BILITOT  0.6 07/15/2013    Imaging Studies: No results found.

## 2013-08-21 NOTE — Patient Instructions (Addendum)
1. I will review copy of your records once received. 2. Linzess 290 mcg daily on empty stomach. Voucher provided. Prescription sent to your pharmacy.

## 2013-08-22 ENCOUNTER — Telehealth: Payer: Self-pay | Admitting: Gastroenterology

## 2013-08-22 DIAGNOSIS — R109 Unspecified abdominal pain: Secondary | ICD-10-CM

## 2013-08-22 NOTE — Telephone Encounter (Signed)
Please let patient know that I reviewed her records from [facility]. No significant findings. Recommendations as outlined at time of office visit. Patient should call if she has recurrent problems or if her bowel movements do not improve on Linzess.  She had mildly low HCT, MCV but Hemoglobin was normal.   Let's recheck CBC, along with ferritin in two months.

## 2013-08-22 NOTE — Assessment & Plan Note (Signed)
52 year old lady with recent crampy abdominal pain which is now resolved. Some increased difficulty with having complete bowel movements. No significant improvement on MiraLax. Reviewed records from [facility] and from [facility] hospital admission. No significant findings. Colonoscopy up-to-date  Last done September 2013.  Trial of Linzess. Call with recurrent symptoms or if bowel movements do not improve.

## 2013-08-23 NOTE — Telephone Encounter (Signed)
Tried to call pt- number was busy  

## 2013-08-26 NOTE — Progress Notes (Signed)
CC'd to PCP 

## 2013-08-27 NOTE — Telephone Encounter (Signed)
Tried to call pt- number was busy  

## 2013-09-03 ENCOUNTER — Other Ambulatory Visit: Payer: Self-pay | Admitting: Gastroenterology

## 2013-09-03 DIAGNOSIS — R109 Unspecified abdominal pain: Secondary | ICD-10-CM

## 2013-09-03 NOTE — Telephone Encounter (Signed)
What is the status on this one? 

## 2013-09-03 NOTE — Telephone Encounter (Signed)
Lab order on file. 

## 2013-09-03 NOTE — Telephone Encounter (Signed)
Tried to call pt- no answer.letter mailed to pt.

## 2013-09-25 ENCOUNTER — Other Ambulatory Visit (HOSPITAL_COMMUNITY): Payer: Self-pay | Admitting: Pulmonary Disease

## 2013-09-25 DIAGNOSIS — Z139 Encounter for screening, unspecified: Secondary | ICD-10-CM

## 2013-10-01 ENCOUNTER — Other Ambulatory Visit: Payer: Self-pay

## 2013-10-01 DIAGNOSIS — R109 Unspecified abdominal pain: Secondary | ICD-10-CM

## 2013-10-10 ENCOUNTER — Other Ambulatory Visit: Payer: Self-pay

## 2013-10-15 LAB — CBC WITH DIFFERENTIAL/PLATELET
Hemoglobin: 12.1 g/dL (ref 12.0–15.0)
Lymphocytes Relative: 53 % — ABNORMAL HIGH (ref 12–46)
Lymphs Abs: 2.6 10*3/uL (ref 0.7–4.0)
Monocytes Relative: 9 % (ref 3–12)
Neutro Abs: 1.8 10*3/uL (ref 1.7–7.7)
Neutrophils Relative %: 37 % — ABNORMAL LOW (ref 43–77)
RBC: 4.68 MIL/uL (ref 3.87–5.11)

## 2013-10-15 LAB — FERRITIN: Ferritin: 39 ng/mL (ref 10–291)

## 2013-10-24 ENCOUNTER — Ambulatory Visit (HOSPITAL_COMMUNITY): Payer: Self-pay

## 2013-11-05 ENCOUNTER — Ambulatory Visit (HOSPITAL_COMMUNITY): Payer: Self-pay

## 2013-11-05 ENCOUNTER — Emergency Department (HOSPITAL_BASED_OUTPATIENT_CLINIC_OR_DEPARTMENT_OTHER)
Admission: EM | Admit: 2013-11-05 | Discharge: 2013-11-05 | Disposition: A | Discharge status: Home / Self Care | Payer: 59 | Attending: Emergency Medicine | Admitting: Emergency Medicine

## 2013-11-05 ENCOUNTER — Encounter (HOSPITAL_BASED_OUTPATIENT_CLINIC_OR_DEPARTMENT_OTHER): Payer: Self-pay | Admitting: Emergency Medicine

## 2013-11-05 ENCOUNTER — Emergency Department (HOSPITAL_BASED_OUTPATIENT_CLINIC_OR_DEPARTMENT_OTHER): Payer: 59

## 2013-11-05 DIAGNOSIS — Z860100 Personal history of colon polyps, unspecified: Secondary | ICD-10-CM | POA: Insufficient documentation

## 2013-11-05 DIAGNOSIS — H9209 Otalgia, unspecified ear: Secondary | ICD-10-CM | POA: Insufficient documentation

## 2013-11-05 DIAGNOSIS — F411 Generalized anxiety disorder: Secondary | ICD-10-CM | POA: Insufficient documentation

## 2013-11-05 DIAGNOSIS — J329 Chronic sinusitis, unspecified: Secondary | ICD-10-CM | POA: Insufficient documentation

## 2013-11-05 DIAGNOSIS — K5900 Constipation, unspecified: Secondary | ICD-10-CM | POA: Insufficient documentation

## 2013-11-05 DIAGNOSIS — E785 Hyperlipidemia, unspecified: Secondary | ICD-10-CM | POA: Insufficient documentation

## 2013-11-05 DIAGNOSIS — Z8679 Personal history of other diseases of the circulatory system: Secondary | ICD-10-CM | POA: Insufficient documentation

## 2013-11-05 DIAGNOSIS — Z79899 Other long term (current) drug therapy: Secondary | ICD-10-CM | POA: Insufficient documentation

## 2013-11-05 MED ORDER — AMOXICILLIN-POT CLAVULANATE 875-125 MG PO TABS: 1.0000 | ORAL_TABLET | Freq: Two times a day (BID) | ORAL | Status: DC

## 2013-11-05 NOTE — ED Provider Notes (Signed)
CSN: [id number]     Arrival date & time 11/05/13  1622 History   First MD Initiated Contact with Patient 11/05/13 1625     Chief Complaint  Patient presents with  . Sore Throat  . Otalgia   (Consider location/radiation/quality/duration/timing/severity/associated sxs/prior Treatment) Patient is a 52 y.o. female presenting with cough. The history is provided by the patient. No language interpreter was used.  Cough Cough characteristics:  Non-productive Severity:  Moderate Duration:  2 days Timing:  Constant Progression:  Worsening Chronicity:  New Smoker: no   Relieved by:  Nothing Worsened by:  Nothing tried Ineffective treatments:  None tried Associated symptoms: chest pain, ear fullness, ear pain, rhinorrhea, sinus congestion and sore throat     Past Medical History  Diagnosis Date  . Anxiety   . Hemorrhoids 2006  . GERD (gastroesophageal reflux disease)   . Chronic constipation   . Hyperlipemia    Past Surgical History  Procedure Laterality Date  . Cesarean section      x2  . Hemorrhoid surgery  06/30/2008    Dr Lovell Sheehan  . Carpal tunnel right hand    . Knee arthroscopy      2010, RIGHT KNEE   . Esophagogastroduodenoscopy   01/20/2005    Rourk- Normal-appearing esophagus, status post passage of a 6 French Maloney  dilator/ small HH/  Otherwise normal stomach  . 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 Colonoscopy  08/22/2012    Rourk- hyperplastic polyps and tubular adenoma, query infectious colitis vs NSAID effect. Next TCS 08/2017.  . Abdominal hysterectomy    . Esophagogastroduodenoscopy N/A 02/06/2013    [id number]:[id number] esophagus. Small hiatal hernia. Antral erosions: bx no h.pylori.   Family History  Problem Relation Age of Onset  . Colon cancer Brother 51    (paternal half brother)  . Diabetes Father   . Coronary artery  disease Maternal Grandmother    History  Substance Use Topics  . Smoking status: Never Smoker   . Smokeless tobacco: Not on file  . Alcohol Use: No   OB History   Grav Para Term Preterm Abortions TAB SAB Ect Mult Living                 Review of Systems  HENT: Positive for ear pain, rhinorrhea and sore throat.   Respiratory: Positive for cough.   Cardiovascular: Positive for chest pain.  All other systems reviewed and are negative.    Allergies  Shellfish allergy  Home Medications   Current Outpatient Rx  Name  Route  Sig  Dispense  Refill  . escitalopram (LEXAPRO) 10 MG tablet   Oral   Take 5 mg by mouth daily.          . fish oil-omega-3 fatty acids 1000 MG capsule   Oral   Take 2 g by mouth daily.         Marland Kitchen ibuprofen (ADVIL,MOTRIN) 200 MG tablet   Oral   Take 400 mg by mouth every 6 (six) hours as needed. For pain         . Linaclotide (LINZESS) 290 MCG CAPS capsule   Oral   Take 1 capsule (290 mcg total) by mouth daily.   30 capsule   5   . Multiple Vitamins-Minerals (MULTIVITAMIN WITH MINERALS) tablet   Oral   Take 1 tablet by mouth daily.         Marland Kitchen  polyethylene glycol (MIRALAX / GLYCOLAX) packet   Oral   Take 17 g by mouth daily.         . Probiotic Product (PROBIOTIC DAILY PO)   Oral   Take 1 tablet by mouth daily.           BP 127/89  Temp(Src) 98.5 F (36.9 C) (Oral)  Resp 18  Ht 5\' 1"  (1.549 m)  Wt 146 lb (66.225 kg)  BMI 27.60 kg/m2  SpO2 100% Physical Exam  Nursing note and vitals reviewed. Constitutional: She is oriented to person, place, and time. She appears well-developed and well-nourished.  HENT:  Head: Normocephalic and atraumatic.  bilat tm's erythematous,  Throat clear,   Maxillary sinus tenderness  Eyes: Conjunctivae and EOM are normal. Pupils are equal, round, and reactive to light.  Neck: Normal range of motion.  Pulmonary/Chest: Effort normal.  Abdominal: Soft. She exhibits no distension.   Musculoskeletal: Normal range of motion.  Neurological: She is alert and oriented to person, place, and time.  Skin: Skin is warm.  Psychiatric: She has a normal mood and affect.    ED Course  Procedures (including critical care time) Labs Review Labs Reviewed - No data to display Imaging Review No results found.  EKG Interpretation   None       MDM   1. Sinusitis    augmentin    Elson Areas, PA-C 11/05/13 1752

## 2013-11-05 NOTE — ED Notes (Signed)
Sore throat, right ear pain, cough x2 days.  Chest hurts when she coughs and no other time.

## 2013-11-05 NOTE — ED Notes (Signed)
Nicole Gilbert to speak with pt prior to d/c home.

## 2013-11-06 NOTE — ED Provider Notes (Signed)
Medical screening examination/treatment/procedure(s) were performed by non-physician practitioner and as supervising physician I was immediately available for consultation/collaboration.  EKG Interpretation   None        Celestino Ackerman F Cameka Rae, MD 11/06/13 0102 

## 2013-11-19 ENCOUNTER — Ambulatory Visit (HOSPITAL_COMMUNITY)
Admission: RE | Admit: 2013-11-19 | Discharge: 2013-11-19 | Disposition: A | Discharge status: Home / Self Care | Payer: 59 | Source: Ambulatory Visit | Attending: Pulmonary Disease | Admitting: Pulmonary Disease

## 2013-11-19 DIAGNOSIS — Z1231 Encounter for screening mammogram for malignant neoplasm of breast: Secondary | ICD-10-CM | POA: Insufficient documentation

## 2013-11-19 DIAGNOSIS — Z139 Encounter for screening, unspecified: Secondary | ICD-10-CM

## 2013-12-23 ENCOUNTER — Emergency Department (HOSPITAL_BASED_OUTPATIENT_CLINIC_OR_DEPARTMENT_OTHER)
Admission: EM | Admit: 2013-12-23 | Discharge: 2013-12-23 | Disposition: A | Discharge status: Home / Self Care | Payer: 59 | Attending: Emergency Medicine | Admitting: Emergency Medicine

## 2013-12-23 ENCOUNTER — Encounter (HOSPITAL_BASED_OUTPATIENT_CLINIC_OR_DEPARTMENT_OTHER): Payer: Self-pay | Admitting: Emergency Medicine

## 2013-12-23 DIAGNOSIS — Z8679 Personal history of other diseases of the circulatory system: Secondary | ICD-10-CM | POA: Insufficient documentation

## 2013-12-23 DIAGNOSIS — Z79899 Other long term (current) drug therapy: Secondary | ICD-10-CM | POA: Insufficient documentation

## 2013-12-23 DIAGNOSIS — J111 Influenza due to unidentified influenza virus with other respiratory manifestations: Secondary | ICD-10-CM | POA: Insufficient documentation

## 2013-12-23 DIAGNOSIS — Z862 Personal history of diseases of the blood and blood-forming organs and certain disorders involving the immune mechanism: Secondary | ICD-10-CM | POA: Insufficient documentation

## 2013-12-23 DIAGNOSIS — R69 Illness, unspecified: Secondary | ICD-10-CM

## 2013-12-23 DIAGNOSIS — F411 Generalized anxiety disorder: Secondary | ICD-10-CM | POA: Insufficient documentation

## 2013-12-23 DIAGNOSIS — Z8639 Personal history of other endocrine, nutritional and metabolic disease: Secondary | ICD-10-CM | POA: Insufficient documentation

## 2013-12-23 DIAGNOSIS — K5900 Constipation, unspecified: Secondary | ICD-10-CM | POA: Insufficient documentation

## 2013-12-23 LAB — RAPID STREP SCREEN (MED CTR MEBANE ONLY): Streptococcus, Group A Screen (Direct): NEGATIVE

## 2013-12-23 MED ORDER — IBUPROFEN 800 MG PO TABS
800.0000 mg | ORAL_TABLET | Freq: Once | ORAL | Status: AC
  Administered 2013-12-23: 800 mg via ORAL
  Filled 2013-12-23: qty 1

## 2013-12-23 MED ORDER — HYDROCODONE-ACETAMINOPHEN 5-325 MG PO TABS: 1.0000 | ORAL_TABLET | ORAL | Status: DC | PRN

## 2013-12-23 MED ORDER — OSELTAMIVIR PHOSPHATE 75 MG PO CAPS: 75.0000 mg | ORAL_CAPSULE | Freq: Two times a day (BID) | ORAL | Status: DC

## 2013-12-23 NOTE — ED Notes (Signed)
Chills, fever, headache, aching all over, sore throat and cough.

## 2013-12-23 NOTE — Discharge Instructions (Signed)

## 2013-12-23 NOTE — ED Provider Notes (Signed)
CSN: [id number]     Arrival date & time 12/23/13  1131 History   First MD Initiated Contact with Patient 12/23/13 1140     Chief Complaint  Patient presents with  . Influenza   (Consider location/radiation/quality/duration/timing/severity/associated sxs/prior Treatment) Patient is a 53 y.o. female presenting with flu symptoms. The history is provided by the patient. No language interpreter was used.  Influenza Presenting symptoms: cough, fever, headache, myalgias and sore throat   Severity:  Severe Onset quality:  Sudden Duration:  2 days Progression:  Unchanged Chronicity:  New Relieved by:  Nothing Ineffective treatments:  Decongestant Associated symptoms: chills   Associated symptoms: no mental status change, no neck stiffness and no witnessed syncope     Past Medical History  Diagnosis Date  . Anxiety   . Hemorrhoids 2006  . GERD (gastroesophageal reflux disease)   . Chronic constipation   . Hyperlipemia    Past Surgical History  Procedure Laterality Date  . Cesarean section      x2  . Hemorrhoid surgery  06/30/2008    Dr Arnoldo Morale  . Carpal tunnel right hand    . Knee arthroscopy      2010, RIGHT KNEE   . Esophagogastroduodenoscopy   01/20/2005    Rourk- Normal-appearing esophagus, status post passage of a 38 French Maloney  dilator/ small HH/  Otherwise normal stomach  . 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 Colonoscopy  08/22/2012    Rourk- hyperplastic polyps and tubular adenoma, query infectious colitis vs NSAID effect. Next TCS 08/2017.  . Abdominal hysterectomy    . Esophagogastroduodenoscopy N/A 02/06/2013    LZJ:[id number] esophagus. Small hiatal hernia. Antral erosions: bx no h.pylori.   Family History  Problem Relation Age of Onset  . Colon cancer Brother 110    (paternal half brother)  . Diabetes Father   .  Coronary artery disease Maternal Grandmother    History  Substance Use Topics  . Smoking status: Never Smoker   . Smokeless tobacco: Not on file  . Alcohol Use: No   OB History   Grav Para Term Preterm Abortions TAB SAB Ect Mult Living                 Review of Systems  Constitutional: Positive for fever and chills.  HENT: Positive for sore throat.   Respiratory: Positive for cough.   Cardiovascular: Negative.   Musculoskeletal: Positive for myalgias. Negative for neck stiffness.  Neurological: Positive for headaches.    Allergies  Shellfish allergy  Home Medications   Current Outpatient Rx  Name  Route  Sig  Dispense  Refill  . amoxicillin-clavulanate (AUGMENTIN) 875-125 MG per tablet   Oral   Take 1 tablet by mouth every 12 (twelve) hours.   20 tablet   0   . escitalopram (LEXAPRO) 10 MG tablet   Oral   Take 5 mg by mouth daily.          . fish oil-omega-3 fatty acids 1000 MG capsule   Oral   Take 2 g by mouth daily.         Marland Kitchen ibuprofen (ADVIL,MOTRIN) 200 MG tablet   Oral   Take 400 mg by mouth every 6 (six) hours as needed. For pain         . Linaclotide (LINZESS) 290 MCG CAPS capsule   Oral   Take 1  capsule (290 mcg total) by mouth daily.   30 capsule   5   . Multiple Vitamins-Minerals (MULTIVITAMIN WITH MINERALS) tablet   Oral   Take 1 tablet by mouth daily.         . polyethylene glycol (MIRALAX / GLYCOLAX) packet   Oral   Take 17 g by mouth daily.         . Probiotic Product (PROBIOTIC DAILY PO)   Oral   Take 1 tablet by mouth daily.           BP 139/82  Pulse 94  Temp(Src) 99.5 F (37.5 C) (Oral)  Resp 18  SpO2 99% Physical Exam  Nursing note and vitals reviewed. Constitutional: She is oriented to person, place, and time. She appears well-developed and well-nourished.  HENT:  Head: Normocephalic and atraumatic.  Right Ear: External ear normal.  Left Ear: External ear normal.  Mouth/Throat: Posterior oropharyngeal  erythema present.  Eyes: Conjunctivae are normal. Pupils are equal, round, and reactive to light.  Neck: Normal range of motion. Neck supple.  Cardiovascular: Normal rate and regular rhythm.   Pulmonary/Chest: Effort normal and breath sounds normal.  Musculoskeletal: Normal range of motion.  Neurological: She is alert and oriented to person, place, and time.  Skin: Skin is warm and dry.    ED Course  Procedures (including critical care time) Labs Review Labs Reviewed  RAPID STREP SCREEN  CULTURE, GROUP A STREP   Imaging Review No results found.  EKG Interpretation   None       MDM   1. Influenza-like illness    Pt given tamiflu and advised on use    Glendell Docker, NP 12/23/13 1719

## 2013-12-25 LAB — CULTURE, GROUP A STREP

## 2013-12-25 NOTE — ED Provider Notes (Signed)
Medical screening examination/treatment/procedure(s) were performed by non-physician practitioner and as supervising physician I was immediately available for consultation/collaboration.  EKG Interpretation   None        Cortnie Ringel, MD 12/25/13 0853 

## 2014-06-02 ENCOUNTER — Ambulatory Visit (INDEPENDENT_AMBULATORY_CARE_PROVIDER_SITE_OTHER): Payer: 59 | Admitting: Psychology

## 2014-06-02 DIAGNOSIS — F411 Generalized anxiety disorder: Secondary | ICD-10-CM

## 2014-06-05 ENCOUNTER — Other Ambulatory Visit: Payer: Self-pay | Admitting: Gastroenterology

## 2014-06-23 ENCOUNTER — Encounter (HOSPITAL_COMMUNITY): Payer: Self-pay | Admitting: Emergency Medicine

## 2014-06-23 ENCOUNTER — Telehealth: Payer: Self-pay | Admitting: Internal Medicine

## 2014-06-23 ENCOUNTER — Emergency Department (HOSPITAL_COMMUNITY)
Admission: EM | Admit: 2014-06-23 | Discharge: 2014-06-23 | Disposition: A | Discharge status: Home / Self Care | Payer: 59 | Attending: Emergency Medicine | Admitting: Emergency Medicine

## 2014-06-23 DIAGNOSIS — R1013 Epigastric pain: Secondary | ICD-10-CM | POA: Insufficient documentation

## 2014-06-23 DIAGNOSIS — Z79899 Other long term (current) drug therapy: Secondary | ICD-10-CM | POA: Insufficient documentation

## 2014-06-23 DIAGNOSIS — E785 Hyperlipidemia, unspecified: Secondary | ICD-10-CM | POA: Insufficient documentation

## 2014-06-23 DIAGNOSIS — K219 Gastro-esophageal reflux disease without esophagitis: Secondary | ICD-10-CM | POA: Insufficient documentation

## 2014-06-23 DIAGNOSIS — R112 Nausea with vomiting, unspecified: Secondary | ICD-10-CM | POA: Insufficient documentation

## 2014-06-23 DIAGNOSIS — F411 Generalized anxiety disorder: Secondary | ICD-10-CM | POA: Insufficient documentation

## 2014-06-23 LAB — CBC WITH DIFFERENTIAL/PLATELET
BASOS PCT: 0 % (ref 0–1)
Basophils Absolute: 0 10*3/uL (ref 0.0–0.1)
Eosinophils Absolute: 0.1 10*3/uL (ref 0.0–0.7)
Eosinophils Relative: 1 % (ref 0–5)
HEMATOCRIT: 39.6 % (ref 36.0–46.0)
Hemoglobin: 13.4 g/dL (ref 12.0–15.0)
LYMPHS PCT: 60 % — AB (ref 12–46)
Lymphs Abs: 2.7 10*3/uL (ref 0.7–4.0)
MCH: 26.1 pg (ref 26.0–34.0)
MCHC: 33.8 g/dL (ref 30.0–36.0)
MCV: 77 fL — ABNORMAL LOW (ref 78.0–100.0)
MONO ABS: 0.3 10*3/uL (ref 0.1–1.0)
Monocytes Relative: 6 % (ref 3–12)
NEUTROS ABS: 1.5 10*3/uL — AB (ref 1.7–7.7)
Neutrophils Relative %: 33 % — ABNORMAL LOW (ref 43–77)
Platelets: 304 10*3/uL (ref 150–400)
RBC: 5.14 MIL/uL — ABNORMAL HIGH (ref 3.87–5.11)
RDW: 14.4 % (ref 11.5–15.5)
WBC: 4.5 10*3/uL (ref 4.0–10.5)

## 2014-06-23 LAB — COMPREHENSIVE METABOLIC PANEL
ALBUMIN: 4.1 g/dL (ref 3.5–5.2)
ALK PHOS: 69 U/L (ref 39–117)
ALT: 18 U/L (ref 0–35)
AST: 23 U/L (ref 0–37)
Anion gap: 13 (ref 5–15)
BUN: 12 mg/dL (ref 6–23)
CHLORIDE: 101 meq/L (ref 96–112)
CO2: 23 meq/L (ref 19–32)
Calcium: 9.6 mg/dL (ref 8.4–10.5)
Creatinine, Ser: 0.79 mg/dL (ref 0.50–1.10)
GFR calc Af Amer: >90 mL/min (ref >90)
Glucose, Bld: 101 mg/dL — ABNORMAL HIGH (ref 70–99)
Potassium: 4.1 mEq/L (ref 3.7–5.3)
SODIUM: 137 meq/L (ref 137–147)
Total Bilirubin: 0.5 mg/dL (ref 0.3–1.2)
Total Protein: 8.3 g/dL (ref 6.0–8.3)

## 2014-06-23 LAB — TROPONIN I: Troponin I: <0.3 ng/mL (ref <0.30)

## 2014-06-23 LAB — LIPASE, BLOOD: LIPASE: 67 U/L — AB (ref 11–59)

## 2014-06-23 MED ORDER — FAMOTIDINE IN NACL 20-0.9 MG/50ML-% IV SOLN
20.0000 mg | Freq: Once | INTRAVENOUS | Status: AC
  Administered 2014-06-23: 20 mg via INTRAVENOUS
  Filled 2014-06-23: qty 50

## 2014-06-23 MED ORDER — ONDANSETRON HCL 4 MG/2ML IJ SOLN: 4.0000 mg | Freq: Once | INTRAMUSCULAR | Status: DC

## 2014-06-23 MED ORDER — ONDANSETRON HCL 4 MG/2ML IJ SOLN
4.0000 mg | Freq: Once | INTRAMUSCULAR | Status: AC
  Administered 2014-06-23: 4 mg via INTRAVENOUS

## 2014-06-23 MED ORDER — ONDANSETRON HCL 4 MG/2ML IJ SOLN
INTRAMUSCULAR | Status: AC
  Filled 2014-06-23: qty 2

## 2014-06-23 MED ORDER — RANITIDINE HCL 150 MG PO CAPS: 150.0000 mg | ORAL_CAPSULE | Freq: Two times a day (BID) | ORAL | Status: DC

## 2014-06-23 MED ORDER — SODIUM CHLORIDE 0.9 % IV SOLN
Freq: Once | INTRAVENOUS | Status: AC
  Administered 2014-06-23: 1000 mL via INTRAVENOUS

## 2014-06-23 MED ORDER — ONDANSETRON HCL 4 MG/2ML IJ SOLN: 4.0000 mg | INTRAMUSCULAR | Status: DC | PRN

## 2014-06-23 MED ORDER — SODIUM CHLORIDE 0.9 % IV BOLUS (SEPSIS)
1000.0000 mL | Freq: Once | INTRAVENOUS | Status: AC
  Administered 2014-06-23: 1000 mL via INTRAVENOUS

## 2014-06-23 MED ORDER — PROMETHAZINE HCL 25 MG/ML IJ SOLN
12.5000 mg | Freq: Once | INTRAMUSCULAR | Status: AC
  Administered 2014-06-23: 12.5 mg via INTRAVENOUS
  Filled 2014-06-23: qty 1

## 2014-06-23 MED ORDER — GI COCKTAIL ~~LOC~~
30.0000 mL | Freq: Once | ORAL | Status: AC
  Administered 2014-06-23: 30 mL via ORAL
  Filled 2014-06-23: qty 30

## 2014-06-23 MED ORDER — PANTOPRAZOLE SODIUM 40 MG IV SOLR
40.0000 mg | Freq: Once | INTRAVENOUS | Status: AC
  Administered 2014-06-23: 40 mg via INTRAVENOUS
  Filled 2014-06-23: qty 40

## 2014-06-23 MED ORDER — ONDANSETRON HCL 4 MG PO TABS: 4.0000 mg | ORAL_TABLET | Freq: Three times a day (TID) | ORAL | Status: DC | PRN

## 2014-06-23 NOTE — ED Notes (Signed)
Had to move patient to Room 9 in the ED due to malfunction of monitor in Room 8. Repeat  EKG done and given to Dr Sabra Heck at 0345am.

## 2014-06-23 NOTE — Telephone Encounter (Signed)
Pt called today to make OV with [place]. Seen in the ED yesterday for nausea, vomiting and abd pain. She said that she was vomiting blood. I have her on the schedule for 8/26 at 1030. Please advise if she should come in sooner. [phone]

## 2014-06-23 NOTE — ED Provider Notes (Signed)
CSN: [id number]     Arrival date & time 06/23/14  0306 History   First MD Initiated Contact with Patient 06/23/14 0310     Chief Complaint  Patient presents with  . Emesis  . Abdominal Pain     (Consider location/radiation/quality/duration/timing/severity/associated sxs/prior Treatment) HPI Comments: The pt is a very pleasant 53 y/o female with hx of headaches (diagnosied with migraines per pt) as well as a hx of anxiety (Lexapro) and GERD.  She states that she has had a month or so of ongoing headaches which can be associated with n/v but this evening while at work, in addition to headaches she has been having abdominal pain in the epigastrium with associated n/v, she tried to eat a bag of chips but didn't help settle her stomach - she has had 400cc of emesis.  No meds given pta.  Many years ago had a hysterectomy.  No complications of the procedure.  Patient is a 53 y.o. female presenting with vomiting and abdominal pain. The history is provided by the patient and medical records.  Emesis Associated symptoms: abdominal pain   Abdominal Pain Associated symptoms: vomiting     Past Medical History  Diagnosis Date  . Anxiety   . Hemorrhoids 2006  . GERD (gastroesophageal reflux disease)   . Chronic constipation   . Hyperlipemia    Past Surgical History  Procedure Laterality Date  . Cesarean section      x2  . Hemorrhoid surgery  06/30/2008    Dr Arnoldo Morale  . Carpal tunnel right hand    . Knee arthroscopy      2010, RIGHT KNEE   . Esophagogastroduodenoscopy   01/20/2005    Rourk- Normal-appearing esophagus, status post passage of a 58 French Maloney  dilator/ small HH/  Otherwise normal stomach  . 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 Colonoscopy  08/22/2012    Rourk- hyperplastic polyps and tubular adenoma, query infectious colitis vs  NSAID effect. Next TCS 08/2017.  . Abdominal hysterectomy    . Esophagogastroduodenoscopy N/A 02/06/2013    XIP:JASNKN esophagus. Small hiatal hernia. Antral erosions: bx no h.pylori.   Family History  Problem Relation Age of Onset  . Colon cancer Brother 45    (paternal half brother)  . Diabetes Father   . Coronary artery disease Maternal Grandmother    History  Substance Use Topics  . Smoking status: Never Smoker   . Smokeless tobacco: Not on file  . Alcohol Use: No   OB History   Grav Para Term Preterm Abortions TAB SAB Ect Mult Living                 Review of Systems  Gastrointestinal: Positive for vomiting and abdominal pain.  All other systems reviewed and are negative.     Allergies  Shellfish allergy  Home Medications   Prior to Admission medications   Medication Sig Start Date End Date Taking? Authorizing Provider  amoxicillin-clavulanate (AUGMENTIN) 875-125 MG per tablet Take 1 tablet by mouth every 12 (twelve) hours. 11/05/13   Fransico Meadow, PA-C  escitalopram (LEXAPRO) 10 MG tablet Take 5 mg by mouth daily.     Historical Provider, MD  fish oil-omega-3 fatty acids 1000 MG capsule Take 2 g by mouth daily.    Historical Provider, MD  HYDROcodone-acetaminophen (NORCO/VICODIN) 5-325 MG per tablet Take 1-2 tablets by mouth  every 4 (four) hours as needed. 12/23/13   Glendell Docker, NP  ibuprofen (ADVIL,MOTRIN) 200 MG tablet Take 400 mg by mouth every 6 (six) hours as needed. For pain    Historical Provider, MD  Linaclotide (LINZESS) 290 MCG CAPS capsule Take 1 capsule (290 mcg total) by mouth daily. 08/21/13   Mahala Menghini, PA-C  Multiple Vitamins-Minerals (MULTIVITAMIN WITH MINERALS) tablet Take 1 tablet by mouth daily.    Historical Provider, MD  ondansetron (ZOFRAN) 4 MG tablet Take 1 tablet (4 mg total) by mouth every 8 (eight) hours as needed for nausea or vomiting. 06/23/14   Johnna Acosta, MD  oseltamivir (TAMIFLU) 75 MG capsule Take 1 capsule (75 mg total)  by mouth every 12 (twelve) hours. 12/23/13   Glendell Docker, NP  pantoprazole (PROTONIX) 40 MG tablet TAKE 1 TABLET BY MOUTH ONCE DAILY 06/05/14   Orvil Feil, NP  polyethylene glycol [facility] / GLYCOLAX) packet Take 17 g by mouth daily.    Historical Provider, MD  Probiotic Product (PROBIOTIC DAILY PO) Take 1 tablet by mouth daily.     Historical Provider, MD  ranitidine (ZANTAC) 150 MG capsule Take 1 capsule (150 mg total) by mouth 2 (two) times daily. 06/23/14   Johnna Acosta, MD   BP 107/71  Pulse 71  Temp(Src) 98 F (36.7 C) (Oral)  Resp 18  SpO2 100% Physical Exam  Nursing note and vitals reviewed. Constitutional: She appears well-developed and well-nourished.  Uncomfortable appearing  HENT:  Head: Normocephalic and atraumatic.  Mouth/Throat: Oropharynx is clear and moist. No oropharyngeal exudate.  Eyes: Conjunctivae and EOM are normal. Pupils are equal, round, and reactive to light. Right eye exhibits no discharge. Left eye exhibits no discharge. No scleral icterus.  Neck: Normal range of motion. Neck supple. No JVD present. No thyromegaly present.  Cardiovascular: Normal rate, regular rhythm, normal heart sounds and intact distal pulses.  Exam reveals no gallop and no friction rub.   No murmur heard. Pulmonary/Chest: Effort normal and breath sounds normal. No respiratory distress. She has no wheezes. She has no rales.  Abdominal: Soft. Bowel sounds are normal. She exhibits no distension and no mass. There is tenderness ( ttp in the epigastrium, no RUQ ttp and no RLQ ttp.).  Musculoskeletal: Normal range of motion. She exhibits no edema and no tenderness.  Lymphadenopathy:    She has no cervical adenopathy.  Neurological: She is alert. Coordination normal.  Skin: Skin is warm and dry. No rash noted. No erythema.  Psychiatric: She has a normal mood and affect. Her behavior is normal.    ED Course  Procedures (including critical care time) Labs Review Labs Reviewed  CBC WITH  DIFFERENTIAL - Abnormal; Notable for the following:    RBC 5.14 (*)    MCV 77.0 (*)    Neutrophils Relative % 33 (*)    Neutro Abs 1.5 (*)    Lymphocytes Relative 60 (*)    All other components within normal limits  COMPREHENSIVE METABOLIC PANEL - Abnormal; Notable for the following:    Glucose, Bld 101 (*)    All other components within normal limits  LIPASE, BLOOD - Abnormal; Notable for the following:    Lipase 67 (*)    All other components within normal limits  TROPONIN I    Imaging Review No results found.   EKG Interpretation   Date/Time:  Monday June 23 2014 03:34:13 EDT Ventricular Rate:  76 PR Interval:  179 QRS Duration: 86 QT Interval:  388 QTC Calculation: 436 R Axis:   73 Text Interpretation:  Sinus rhythm Normal ECG since last tracing no  significant change Confirmed by Lorann Tani  MD, Johany Hansman ([id number]) on 06/23/2014  3:44:36 AM      MDM   Final diagnoses:  Epigastric pain  Non-intractable vomiting with nausea, vomiting of unspecified type    Would consider GI sources of pain including PUD (hx of using ibuprofen though not recently), pancreatitis or cholecystitis liver disease.  She does have constipation but wouldn't expect that to cause this severe or sx.  Fluids, meds, labs, reeval.  Labs overall unremarkable - all findings have been explained to Ms Ronnald Ramp and she has been given IVF, ant acid meds as well as zofran and GI cocktail - she states that is much better than on arrival but she is still having some discomfort - I have reexamined her and she has no guarding and minimal ttp.  Lipase niormal, LFT's unremarkable and no leukocytosis.    She now states that she has had hx of esophageal dilation and GERD but went off her PPI months ago - this is possibly adding to her discomfort - she will be started back on H2 and asked to f/u with GI - she is in agreement - she states she wants to go home, she knows the indication for return.  Sh ehas not vomitted since  getting registered as a pt and visibly appears more comfortable.  Meds given in ED:  Medications  ondansetron (ZOFRAN) injection 4 mg (not administered)  ondansetron (ZOFRAN) injection 4 mg (4 mg Intravenous Given 06/23/14 0312)  gi cocktail (Maalox,Lidocaine,Donnatal) (30 mLs Oral Given 06/23/14 0326)  sodium chloride 0.9 % bolus 1,000 mL (0 mLs Intravenous Stopped 06/23/14 0407)  pantoprazole (PROTONIX) injection 40 mg (40 mg Intravenous Given 06/23/14 0501)  famotidine (PEPCID) IVPB 20 mg (0 mg Intravenous Stopped 06/23/14 0458)  0.9 %  sodium chloride infusion (1,000 mLs Intravenous New Bag/Given 06/23/14 0425)  promethazine (PHENERGAN) injection 12.5 mg (12.5 mg Intravenous Given 06/23/14 0435)    New Prescriptions   ONDANSETRON (ZOFRAN) 4 MG TABLET    Take 1 tablet (4 mg total) by mouth every 8 (eight) hours as needed for nausea or vomiting.   RANITIDINE (ZANTAC) 150 MG CAPSULE    Take 1 capsule (150 mg total) by mouth 2 (two) times daily.      Johnna Acosta, MD 06/23/14 [phone]

## 2014-06-23 NOTE — Telephone Encounter (Signed)
Use an urgent spot please.

## 2014-06-23 NOTE — Telephone Encounter (Signed)
Pt is coming tomorrow at 1130 to see AS

## 2014-06-23 NOTE — Discharge Instructions (Signed)
Your testing has not shown a cause for your abdominal pain and your nausea and vomiting - you MUST follow up with Dr. Gala Romney and or your family doctor for recheck this week - call today.  I want you to start taking Zantac or Pepcid twice a day for the next 2 weeks and then cut back to once daily - follow the guidelines for the diet attached.  Read the other attachments as well.  Zofran for nausea.  Please call your doctor for a followup appointment within 24-48 hours. When you talk to your doctor please let them know that you were seen in the emergency department and have them acquire all of your records so that they can discuss the findings with you and formulate a treatment plan to fully care for your new and ongoing problems.

## 2014-06-23 NOTE — ED Notes (Signed)
Awake, up to bathroom, moaning. States she still hurts. No further vomiting.

## 2014-06-23 NOTE — ED Notes (Signed)
Attempted to assess pain/nausea. Pt very sleepy, nods her head yes when asked if she has pain, but will not verbalized how much. Arched her back and grimaced when I palpated upper abd.

## 2014-06-24 ENCOUNTER — Ambulatory Visit (INDEPENDENT_AMBULATORY_CARE_PROVIDER_SITE_OTHER): Payer: 59 | Admitting: Gastroenterology

## 2014-06-24 ENCOUNTER — Encounter: Payer: Self-pay | Admitting: Gastroenterology

## 2014-06-24 ENCOUNTER — Other Ambulatory Visit: Payer: Self-pay | Admitting: Internal Medicine

## 2014-06-24 VITALS — BP 122/81 | HR 58 | Temp 97.8°F | Resp 18 | Ht 61.0 in | Wt 153.0 lb

## 2014-06-24 DIAGNOSIS — K2100 Gastro-esophageal reflux disease with esophagitis, without bleeding: Secondary | ICD-10-CM

## 2014-06-24 DIAGNOSIS — R109 Unspecified abdominal pain: Secondary | ICD-10-CM

## 2014-06-24 MED ORDER — DEXLANSOPRAZOLE 60 MG PO CPDR: 60.0000 mg | DELAYED_RELEASE_CAPSULE | Freq: Every day | ORAL | Status: DC

## 2014-06-24 MED ORDER — SUCRALFATE 1 GM/10ML PO SUSP
1.0000 g | Freq: Four times a day (QID) | ORAL | Status: DC
Start: 2014-06-24 — End: 2014-10-29

## 2014-06-24 NOTE — Patient Instructions (Signed)
Start taking Dexilant. I have provided samples and sent this to your pharmacy.  I have also sent Carafate suspension to your pharmacy to take four times a day to help coat your stomach.  We have scheduled you for an upper endoscopy as soon as possible. You may ultimately need further evaluation/abdominal imaging. We will see what the endoscopy shows first.

## 2014-06-24 NOTE — Assessment & Plan Note (Signed)
53 year old female with new onset epigastric discomfort, nausea, and vomiting, worsened with eating. Gallbladder remains in situ. Possible streaky hematemesis recently in the setting of repeated vomiting. Question MW tear, esophagitis. Labs unremarkable. WIll plan on EGD with Dr. Gala Romney in near future. Start Dexilant samples. Carafate prescription provided. Risks and benefits discussed in detail with stated understanding. Proceed with [place] +/- HIDA is negative EGD.

## 2014-06-24 NOTE — Progress Notes (Signed)
Referring Provider: [name], MD Primary Care Physician:  [name], MD Primary GI: Dr. Gala Romney   Chief Complaint  Patient presents with  . Emesis  . Abdominal Pain    HPI:   Nicole Gilbert presents today as an urgent work-in secondary to acute abdominal pain, nausea, and vomiting. Hx significant for constipation. Notes upper abdominal discomfort, gagging. Using 10 day juice cleanse. Doesn't want to eat much. Would rather juice. Pain onset "Sunday, had shooting pain in left-side of chest. Became nauseated at work. 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 Underlying nausea. Pink-tinged, red emesis. History of GERD but has not had significant exacerbations until recently. Still feels like she has to burp but can't. Decreased appetite. Feels like things are sitting heavy in upper abdomen. Discomfort worse with eating. No melena. Constipation managed with Linzess 290 mcg prn. Not taking Protonix on a regular basis. Takes about 3 times a week. Zantac BID, just started a few days ago. Rare Ibuprofen for headaches. Gallbladder in situ. Has tried/failed Prilosec, Protonix, Nexium. Not tried Dexilant.   Past Medical History  Diagnosis Date  . Anxiety   . Hemorrhoids 2006  . GERD (gastroesophageal reflux disease)   . Chronic constipation   . Hyperlipemia     Past Surgical History  Procedure Laterality Date  . Cesarean section      x2  . Hemorrhoid surgery  06/30/2008    Dr Jenkins  . Carpal tunnel right hand    . Knee arthroscopy      20" 10, RIGHT KNEE   . Esophagogastroduodenoscopy   01/20/2005    Rourk- Normal-appearing esophagus, status post passage of a 45 French Maloney  dilator/ small HH/  Otherwise normal stomach  . 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 Colonoscopy  08/22/2012    Rourk- hyperplastic polyps and tubular  adenoma, query infectious colitis vs NSAID effect. Next TCS 08/2017.  . Abdominal hysterectomy    . Esophagogastroduodenoscopy N/A 02/06/2013    NTZ:GYFVCB esophagus. Small hiatal hernia. Antral erosions: bx no h.pylori.    Current Outpatient Prescriptions  Medication Sig Dispense Refill  . fish oil-omega-3 fatty acids 1000 MG capsule Take 2 g by mouth daily.      Marland Kitchen ibuprofen (ADVIL,MOTRIN) 200 MG tablet Take 400 mg by mouth every 6 (six) hours as needed. For pain      . Linaclotide (LINZESS) 290 MCG CAPS capsule Take 1 capsule (290 mcg total) by mouth daily.  30 capsule  5  . Multiple Vitamins-Minerals (MULTIVITAMIN WITH MINERALS) tablet Take 1 tablet by mouth daily.      . ondansetron (ZOFRAN) 4 MG tablet Take 1 tablet (4 mg total) by mouth every 8 (eight) hours as needed for nausea or vomiting.  10 tablet  0  . Probiotic Product (PROBIOTIC DAILY PO) Take 1 tablet by mouth daily.       . ranitidine (ZANTAC) 150 MG capsule Take 1 capsule (150 mg total) by mouth 2 (two) times daily.  30 capsule  0  . dexlansoprazole (DEXILANT) 60 MG capsule Take 1 capsule (60 mg total) by mouth daily.  30 capsule  3  . pantoprazole (PROTONIX) 40 MG tablet TAKE 1 TABLET BY MOUTH ONCE DAILY  64 tablet  3  . sucralfate (CARAFATE) 1 GM/10ML suspension Take 10 mLs (1 g total) by mouth 4 (  four) times daily.  420 mL  1   No current facility-administered medications for this visit.    Allergies as of 06/24/2014 - Review Complete 06/24/2014  Allergen Reaction Noted  . Shellfish allergy Hives and Itching 08/13/2012    Family History  Problem Relation Age of Onset  . Colon cancer Brother 44    (paternal half brother)  . Diabetes Father   . Coronary artery disease Maternal Grandmother     History   Social History  . Marital Status: Divorced    Spouse Name: N/A    Number of Children: 2  . Years of Education: N/A   Occupational History  . nursing assistant, [employer]   .     Social History Main Topics  .  Smoking status: Never Smoker   . Smokeless tobacco: None  . Alcohol Use: Yes     Comment: rare  . Drug Use: No  . Sexual Activity: Yes    Birth Control/ Protection: None   Other Topics Concern  . None   Social History Narrative  . None    Review of Systems: As mentioned in HPI.   Physical Exam: BP 122/81  Pulse 58  Temp(Src) 97.8 F (36.6 C) (Oral)  Resp 18  Ht 5\' 1"  (1.549 m)  Wt 153 lb (69.4 kg)  BMI 28.92 kg/m2 General:   Alert and oriented. No distress noted. Pleasant and cooperative.  Head:  Normocephalic and atraumatic. Eyes:  Conjuctiva clear without scleral icterus. Mouth:  Oral mucosa pink and moist. Good dentition. No lesions. Heart:  S1, S2 present without murmurs, rubs, or gallops. Regular rate and rhythm. Abdomen:  +BS, soft, TTP LUQ/epigastric and non-distended. No rebound or guarding. No HSM or masses noted. Msk:  Symmetrical without gross deformities. Normal posture. Extremities:  Without edema. Neurologic:  Alert and  oriented x4;  grossly normal neurologically. Skin:  Intact without significant lesions or rashes. Psych:  Alert and cooperative. Normal mood and affect.  Lab Results  Component Value Date   WBC 4.5 06/23/2014   HGB 13.4 06/23/2014   HCT 39.6 06/23/2014   MCV 77.0* 06/23/2014   PLT 304 06/23/2014   Lab Results  Component Value Date   ALT 18 06/23/2014   AST 23 06/23/2014   ALKPHOS 69 06/23/2014   BILITOT 0.5 06/23/2014

## 2014-06-25 ENCOUNTER — Encounter (HOSPITAL_COMMUNITY)
Admission: RE | Disposition: A | Discharge status: Home / Self Care | Payer: Self-pay | Source: Ambulatory Visit | Attending: Internal Medicine

## 2014-06-25 ENCOUNTER — Ambulatory Visit (HOSPITAL_COMMUNITY)
Admission: RE | Admit: 2014-06-25 | Discharge: 2014-06-25 | Disposition: A | Discharge status: Home / Self Care | Payer: 59 | Source: Ambulatory Visit | Attending: Internal Medicine | Admitting: Internal Medicine

## 2014-06-25 ENCOUNTER — Encounter (HOSPITAL_COMMUNITY): Payer: Self-pay

## 2014-06-25 DIAGNOSIS — K229 Disease of esophagus, unspecified: Secondary | ICD-10-CM

## 2014-06-25 DIAGNOSIS — F411 Generalized anxiety disorder: Secondary | ICD-10-CM | POA: Insufficient documentation

## 2014-06-25 DIAGNOSIS — K228 Other specified diseases of esophagus: Secondary | ICD-10-CM | POA: Insufficient documentation

## 2014-06-25 DIAGNOSIS — K2100 Gastro-esophageal reflux disease with esophagitis, without bleeding: Secondary | ICD-10-CM

## 2014-06-25 DIAGNOSIS — Z79899 Other long term (current) drug therapy: Secondary | ICD-10-CM | POA: Insufficient documentation

## 2014-06-25 DIAGNOSIS — K3189 Other diseases of stomach and duodenum: Secondary | ICD-10-CM | POA: Insufficient documentation

## 2014-06-25 DIAGNOSIS — R1010 Upper abdominal pain, unspecified: Secondary | ICD-10-CM

## 2014-06-25 DIAGNOSIS — K2289 Other specified disease of esophagus: Secondary | ICD-10-CM | POA: Insufficient documentation

## 2014-06-25 DIAGNOSIS — Z800 Family history of malignant neoplasm of digestive organs: Secondary | ICD-10-CM | POA: Insufficient documentation

## 2014-06-25 DIAGNOSIS — E785 Hyperlipidemia, unspecified: Secondary | ICD-10-CM | POA: Insufficient documentation

## 2014-06-25 DIAGNOSIS — R1013 Epigastric pain: Principal | ICD-10-CM

## 2014-06-25 DIAGNOSIS — K219 Gastro-esophageal reflux disease without esophagitis: Secondary | ICD-10-CM | POA: Insufficient documentation

## 2014-06-25 HISTORY — PX: BIOPSY: SHX5522

## 2014-06-25 HISTORY — PX: ESOPHAGOGASTRODUODENOSCOPY: SHX5428

## 2014-06-25 SURGERY — EGD (ESOPHAGOGASTRODUODENOSCOPY)
Anesthesia: Moderate Sedation

## 2014-06-25 MED ORDER — MEPERIDINE HCL 100 MG/ML IJ SOLN
INTRAMUSCULAR | Status: AC
  Filled 2014-06-25: qty 2

## 2014-06-25 MED ORDER — SODIUM CHLORIDE 0.9 % IV SOLN
INTRAVENOUS | Status: DC
  Administered 2014-06-25: 14:00:00 via INTRAVENOUS

## 2014-06-25 MED ORDER — MEPERIDINE HCL 100 MG/ML IJ SOLN
INTRAMUSCULAR | Status: DC | PRN
  Administered 2014-06-25: 25 mg via INTRAVENOUS
  Administered 2014-06-25: 50 mg via INTRAVENOUS

## 2014-06-25 MED ORDER — ONDANSETRON HCL 4 MG/2ML IJ SOLN
INTRAMUSCULAR | Status: AC
  Filled 2014-06-25: qty 2

## 2014-06-25 MED ORDER — ONDANSETRON HCL 4 MG/2ML IJ SOLN
INTRAMUSCULAR | Status: DC | PRN
  Administered 2014-06-25: 4 mg via INTRAVENOUS

## 2014-06-25 MED ORDER — LIDOCAINE VISCOUS 2 % MT SOLN
OROMUCOSAL | Status: AC
  Filled 2014-06-25: qty 15

## 2014-06-25 MED ORDER — MIDAZOLAM HCL 5 MG/5ML IJ SOLN
INTRAMUSCULAR | Status: DC | PRN
  Administered 2014-06-25: 1 mg via INTRAVENOUS
  Administered 2014-06-25: 2 mg via INTRAVENOUS

## 2014-06-25 MED ORDER — LIDOCAINE VISCOUS 2 % MT SOLN
OROMUCOSAL | Status: DC | PRN
  Administered 2014-06-25: 1 via OROMUCOSAL

## 2014-06-25 MED ORDER — MIDAZOLAM HCL 5 MG/5ML IJ SOLN
INTRAMUSCULAR | Status: AC
  Filled 2014-06-25: qty 10

## 2014-06-25 MED ORDER — STERILE WATER FOR IRRIGATION IR SOLN
Status: DC | PRN
  Administered 2014-06-25: 15:00:00

## 2014-06-25 NOTE — Discharge Instructions (Signed)
°  EGD Discharge instructions Please read the instructions outlined below and refer to this sheet in the next few weeks. These discharge instructions provide you with general information on caring for yourself after you leave the hospital. Your doctor may also give you specific instructions. While your treatment has been planned according to the most current medical practices available, unavoidable complications occasionally occur. If you have any problems or questions after discharge, please call your doctor. ACTIVITY  You may resume your regular activity but move at a slower pace for the next 24 hours.   Take frequent rest periods for the next 24 hours.   Walking will help expel (get rid of) the air and reduce the bloated feeling in your abdomen.   No driving for 24 hours (because of the anesthesia (medicine) used during the test).   You may shower.   Do not sign any important legal documents or operate any machinery for 24 hours (because of the anesthesia used during the test).  NUTRITION  Drink plenty of fluids.   You may resume your normal diet.   Begin with a light meal and progress to your normal diet.   Avoid alcoholic beverages for 24 hours or as instructed by your caregiver.  MEDICATIONS  You may resume your normal medications unless your caregiver tells you otherwise.  WHAT YOU CAN EXPECT TODAY  You may experience abdominal discomfort such as a feeling of fullness or gas pains.  FOLLOW-UP  Your doctor will discuss the results of your test with you.  SEEK IMMEDIATE MEDICAL ATTENTION IF ANY OF THE FOLLOWING OCCUR:  Excessive nausea (feeling sick to your stomach) and/or vomiting.   Severe abdominal pain and distention (swelling).   Trouble swallowing.   Temperature over 101 F (37.8 C).   Rectal bleeding or vomiting of blood.     Continue Dexilant 60 mg daily  Further recommendations to follow pending review of pathology report  Schedule gallbladder  ultrasound to further evaluate upper abdominal pain.

## 2014-06-25 NOTE — H&P (View-Only) (Signed)
Referring Provider: [name], MD Primary Care Physician:  [name], MD Primary GI: Dr. Gala Romney   Chief Complaint  Patient presents with  . Emesis  . Abdominal Pain    HPI:   Nicole Gilbert presents today as an urgent work-in secondary to acute abdominal pain, nausea, and vomiting. Hx significant for constipation. Notes upper abdominal discomfort, gagging. Using 10 day juice cleanse. Doesn't want to eat much. Would rather juice. Pain onset "Sunday, had shooting pain in left-side of chest. Became nauseated at work. 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 Underlying nausea. Pink-tinged, red emesis. History of GERD but has not had significant exacerbations until recently. Still feels like she has to burp but can't. Decreased appetite. Feels like things are sitting heavy in upper abdomen. Discomfort worse with eating. No melena. Constipation managed with Linzess 290 mcg prn. Not taking Protonix on a regular basis. Takes about 3 times a week. Zantac BID, just started a few days ago. Rare Ibuprofen for headaches. Gallbladder in situ. Has tried/failed Prilosec, Protonix, Nexium. Not tried Dexilant.   Past Medical History  Diagnosis Date  . Anxiety   . Hemorrhoids 2006  . GERD (gastroesophageal reflux disease)   . Chronic constipation   . Hyperlipemia     Past Surgical History  Procedure Laterality Date  . Cesarean section      x2  . Hemorrhoid surgery  06/30/2008    Dr Jenkins  . Carpal tunnel right hand    . Knee arthroscopy      20" 10, RIGHT KNEE   . Esophagogastroduodenoscopy   01/20/2005    Rourk- Normal-appearing esophagus, status post passage of a 67 French Maloney  dilator/ small HH/  Otherwise normal stomach  . 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 Colonoscopy  08/22/2012    Rourk- hyperplastic polyps and tubular  adenoma, query infectious colitis vs NSAID effect. Next TCS 08/2017.  . Abdominal hysterectomy    . Esophagogastroduodenoscopy N/A 02/06/2013    EPP:IRJJOA esophagus. Small hiatal hernia. Antral erosions: bx no h.pylori.    Current Outpatient Prescriptions  Medication Sig Dispense Refill  . fish oil-omega-3 fatty acids 1000 MG capsule Take 2 g by mouth daily.      Marland Kitchen ibuprofen (ADVIL,MOTRIN) 200 MG tablet Take 400 mg by mouth every 6 (six) hours as needed. For pain      . Linaclotide (LINZESS) 290 MCG CAPS capsule Take 1 capsule (290 mcg total) by mouth daily.  30 capsule  5  . Multiple Vitamins-Minerals (MULTIVITAMIN WITH MINERALS) tablet Take 1 tablet by mouth daily.      . ondansetron (ZOFRAN) 4 MG tablet Take 1 tablet (4 mg total) by mouth every 8 (eight) hours as needed for nausea or vomiting.  10 tablet  0  . Probiotic Product (PROBIOTIC DAILY PO) Take 1 tablet by mouth daily.       . ranitidine (ZANTAC) 150 MG capsule Take 1 capsule (150 mg total) by mouth 2 (two) times daily.  30 capsule  0  . dexlansoprazole (DEXILANT) 60 MG capsule Take 1 capsule (60 mg total) by mouth daily.  30 capsule  3  . pantoprazole (PROTONIX) 40 MG tablet TAKE 1 TABLET BY MOUTH ONCE DAILY  64 tablet  3  . sucralfate (CARAFATE) 1 GM/10ML suspension Take 10 mLs (1 g total) by mouth 4 (  four) times daily.  420 mL  1   No current facility-administered medications for this visit.    Allergies as of 06/24/2014 - Review Complete 06/24/2014  Allergen Reaction Noted  . Shellfish allergy Hives and Itching 08/13/2012    Family History  Problem Relation Age of Onset  . Colon cancer Brother 36    (paternal half brother)  . Diabetes Father   . Coronary artery disease Maternal Grandmother     History   Social History  . Marital Status: Divorced    Spouse Name: N/A    Number of Children: 2  . Years of Education: N/A   Occupational History  . nursing assistant, [employer]   .     Social History Main Topics  .  Smoking status: Never Smoker   . Smokeless tobacco: None  . Alcohol Use: Yes     Comment: rare  . Drug Use: No  . Sexual Activity: Yes    Birth Control/ Protection: None   Other Topics Concern  . None   Social History Narrative  . None    Review of Systems: As mentioned in HPI.   Physical Exam: BP 122/81  Pulse 58  Temp(Src) 97.8 F (36.6 C) (Oral)  Resp 18  Ht 5\' 1"  (1.549 m)  Wt 153 lb (69.4 kg)  BMI 28.92 kg/m2 General:   Alert and oriented. No distress noted. Pleasant and cooperative.  Head:  Normocephalic and atraumatic. Eyes:  Conjuctiva clear without scleral icterus. Mouth:  Oral mucosa pink and moist. Good dentition. No lesions. Heart:  S1, S2 present without murmurs, rubs, or gallops. Regular rate and rhythm. Abdomen:  +BS, soft, TTP LUQ/epigastric and non-distended. No rebound or guarding. No HSM or masses noted. Msk:  Symmetrical without gross deformities. Normal posture. Extremities:  Without edema. Neurologic:  Alert and  oriented x4;  grossly normal neurologically. Skin:  Intact without significant lesions or rashes. Psych:  Alert and cooperative. Normal mood and affect.  Lab Results  Component Value Date   WBC 4.5 06/23/2014   HGB 13.4 06/23/2014   HCT 39.6 06/23/2014   MCV 77.0* 06/23/2014   PLT 304 06/23/2014   Lab Results  Component Value Date   ALT 18 06/23/2014   AST 23 06/23/2014   ALKPHOS 69 06/23/2014   BILITOT 0.5 06/23/2014

## 2014-06-25 NOTE — Op Note (Signed)
[facility] [address] [place], [address]   ENDOSCOPY PROCEDURE REPORT  PATIENT: Nicole, Gilbert  MR#: [id number] BIRTHDATE: Jan 18, 1961 , 67  yrs. old GENDER: Female ENDOSCOPIST: R.  Garfield Cornea, MD FACP [facility] REFERRED BY:  Sinda Du, M.D. PROCEDURE DATE:  06/25/2014 PROCEDURE:     EGD with esophageal biopsy  INDICATIONS:      Worsening GERD; dyspepsia  INFORMED CONSENT:   The risks, benefits, limitations, alternatives and imponderables have been discussed.  The potential for biopsy, esophogeal dilation, etc. have also been reviewed.  Questions have been answered.  All parties agreeable.  Please see the history and physical in the medical record for more information.  MEDICATIONS: Demerol 75 mg IV and Versed 3 mg IV in divided doses. Xylocaine gel orally. Zofran 4 mg IV.  DESCRIPTION OF PROCEDURE:   The EG-[id number] ([id number])  endoscope was introduced through the mouth and advanced to the second portion of the duodenum without difficulty or limitations.  The mucosal surfaces were surveyed very carefully during advancement of the scope and upon withdrawal.  Retroflexion view of the proximal stomach and esophagogastric junction was performed.      FINDINGS:    78mm esophageal nodule in distal esophagus. Otherwise, normal esophagus.  Stomach empty. Normal gastric mucosa. Patent pylorus. Normal first and second portion of the duodenum. THERAPEUTIC / DIAGNOSTIC MANEUVERS PERFORMED:  The distal esophageal nodules biopsy.   COMPLICATIONS:  None  IMPRESSION:  Esophageal nodule-status post biopsy; otherwise, normal EGD. No endoscopic explanation for patient's symptoms  RECOMMENDATIONS:  Continue Dexilant 60 mg daily for now. Proceed with gallbladder ultrasound. Followup on pathology.    _______________________________ R. Garfield Cornea, MD FACP [facility] eSigned:  R. Garfield Cornea, MD FACP [facility] 06/25/2014 3:21 PM     CC:

## 2014-06-25 NOTE — Interval H&P Note (Signed)
History and Physical Interval Note:  06/25/2014 2:54 PM  Nicole Gilbert  has presented today for surgery, with the diagnosis of GERD  The various methods of treatment have been discussed with the patient and family. After consideration of risks, benefits and other options for treatment, the patient has consented to  Procedure(s) with comments: ESOPHAGOGASTRODUODENOSCOPY (EGD) (N/A) - 2:45 as a surgical intervention .  The patient's history has been reviewed, patient examined, no change in status, stable for surgery.  I have reviewed the patient's chart and labs.  Questions were answered to the patient's satisfaction.     No change. EGD per plan.The risks, benefits, limitations, alternatives and imponderables have been reviewed with the patient. Potential for esophageal dilation, biopsy, etc. have also been reviewed.  Questions have been answered. All parties agreeable.  Manus Rudd

## 2014-06-26 ENCOUNTER — Other Ambulatory Visit: Payer: Self-pay | Admitting: Internal Medicine

## 2014-06-26 DIAGNOSIS — R1011 Right upper quadrant pain: Secondary | ICD-10-CM

## 2014-06-27 ENCOUNTER — Encounter (HOSPITAL_COMMUNITY): Payer: Self-pay | Admitting: Internal Medicine

## 2014-06-27 ENCOUNTER — Ambulatory Visit (HOSPITAL_COMMUNITY): Payer: 59

## 2014-06-27 ENCOUNTER — Ambulatory Visit (HOSPITAL_COMMUNITY)
Admission: RE | Admit: 2014-06-27 | Discharge: 2014-06-27 | Disposition: A | Discharge status: Home / Self Care | Payer: 59 | Source: Ambulatory Visit | Attending: Internal Medicine | Admitting: Internal Medicine

## 2014-06-27 DIAGNOSIS — R1011 Right upper quadrant pain: Secondary | ICD-10-CM | POA: Insufficient documentation

## 2014-06-28 ENCOUNTER — Encounter: Payer: Self-pay | Admitting: Internal Medicine

## 2014-06-29 ENCOUNTER — Encounter: Payer: Self-pay | Admitting: Internal Medicine

## 2014-07-01 NOTE — Progress Notes (Signed)
Cc'd to PCP 

## 2014-07-08 NOTE — ED Provider Notes (Signed)
Sore throat and fever x 3 days. Tonsils red with exudate. Uvula midline. +anterior cervical LAD.  Rx for pharyngitis: Pen V K 500mg  BID x 10 days.  Wynetta Fines, MD 07/08/14 [phone]

## 2014-07-09 IMAGING — MG MM DIGITAL SCREENING BILAT
4 series · 4 of 4 positions shown · non-contrast
Comparison: Previous exams.

CLINICAL DATA: Screening.

DIGITAL BILATERAL SCREENING MAMMOGRAM WITH CAD

[L CC]
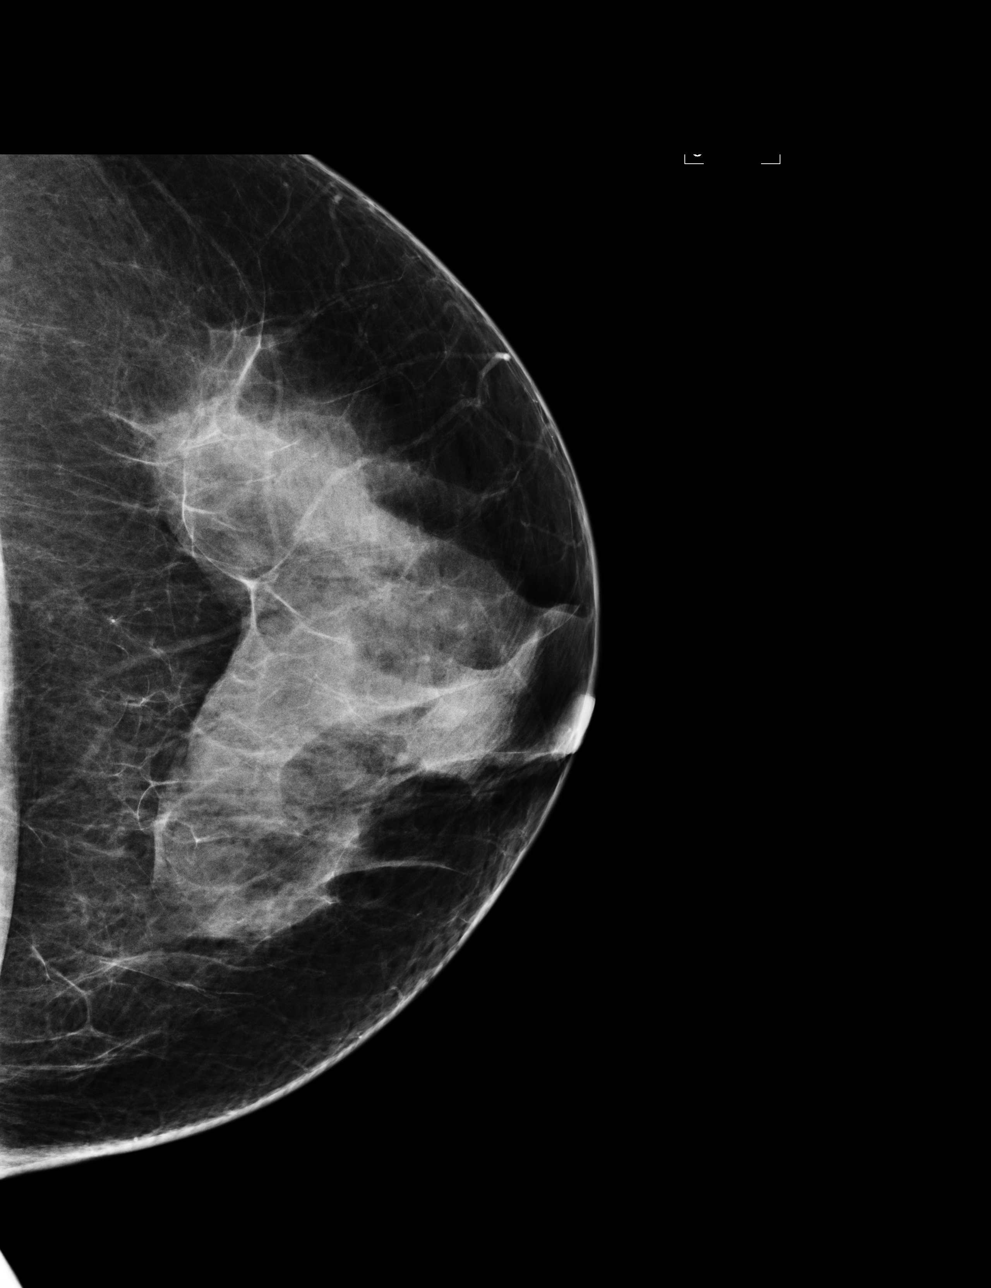

[L MLO]
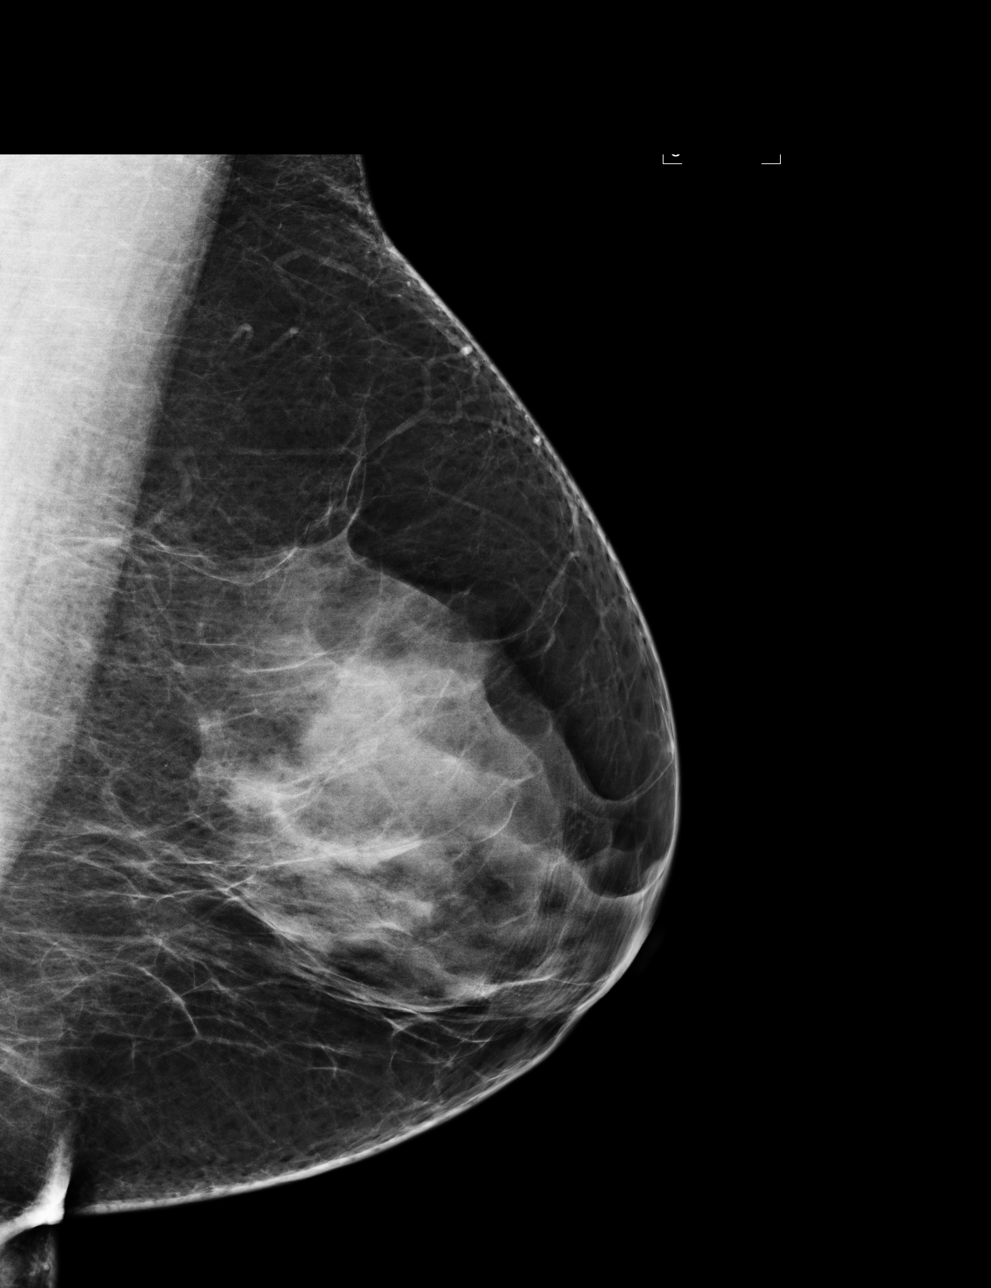

[R CC]
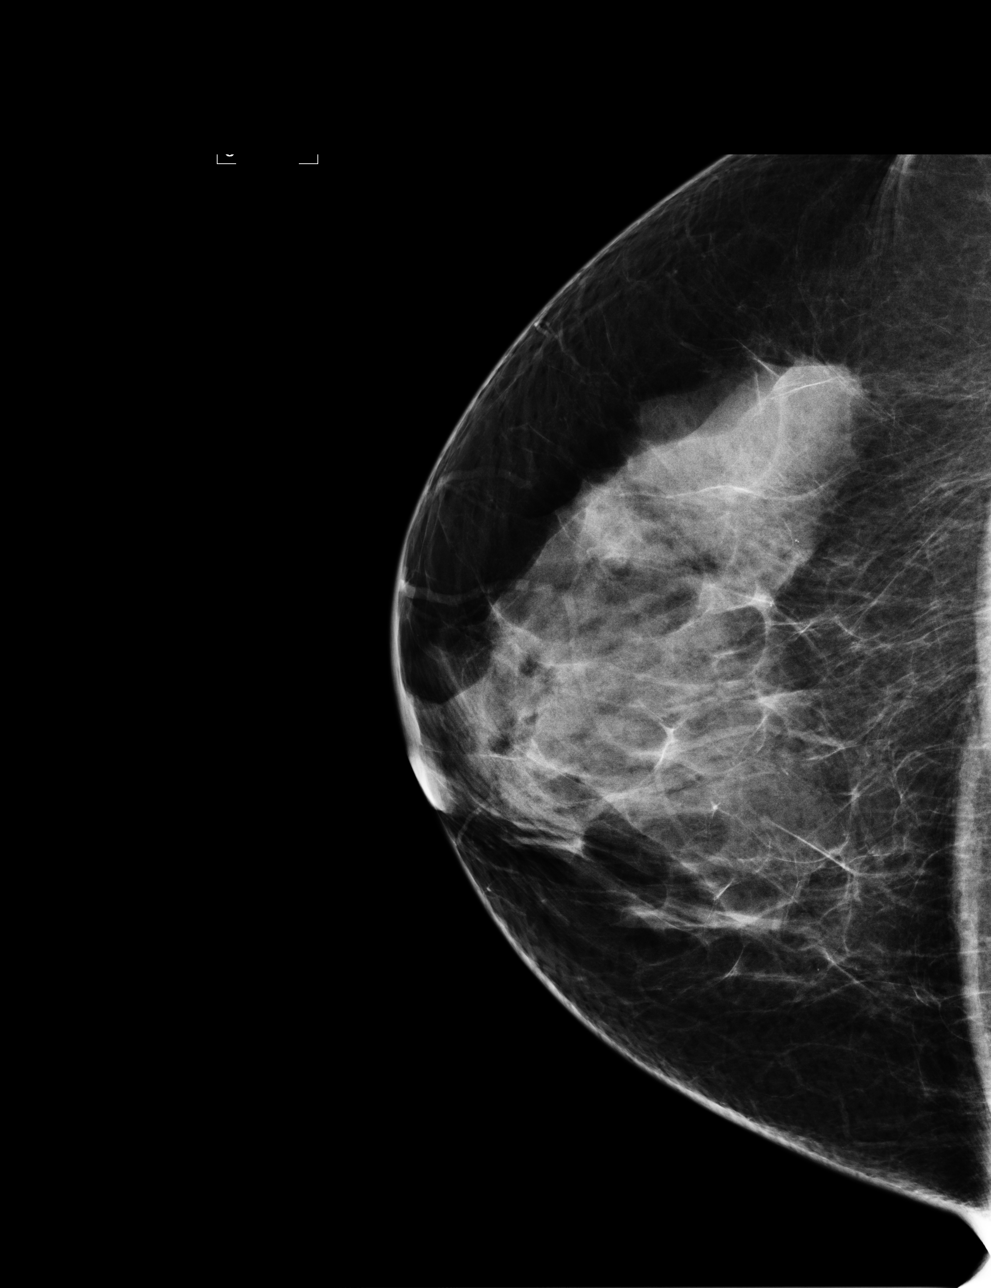

[R MLO]
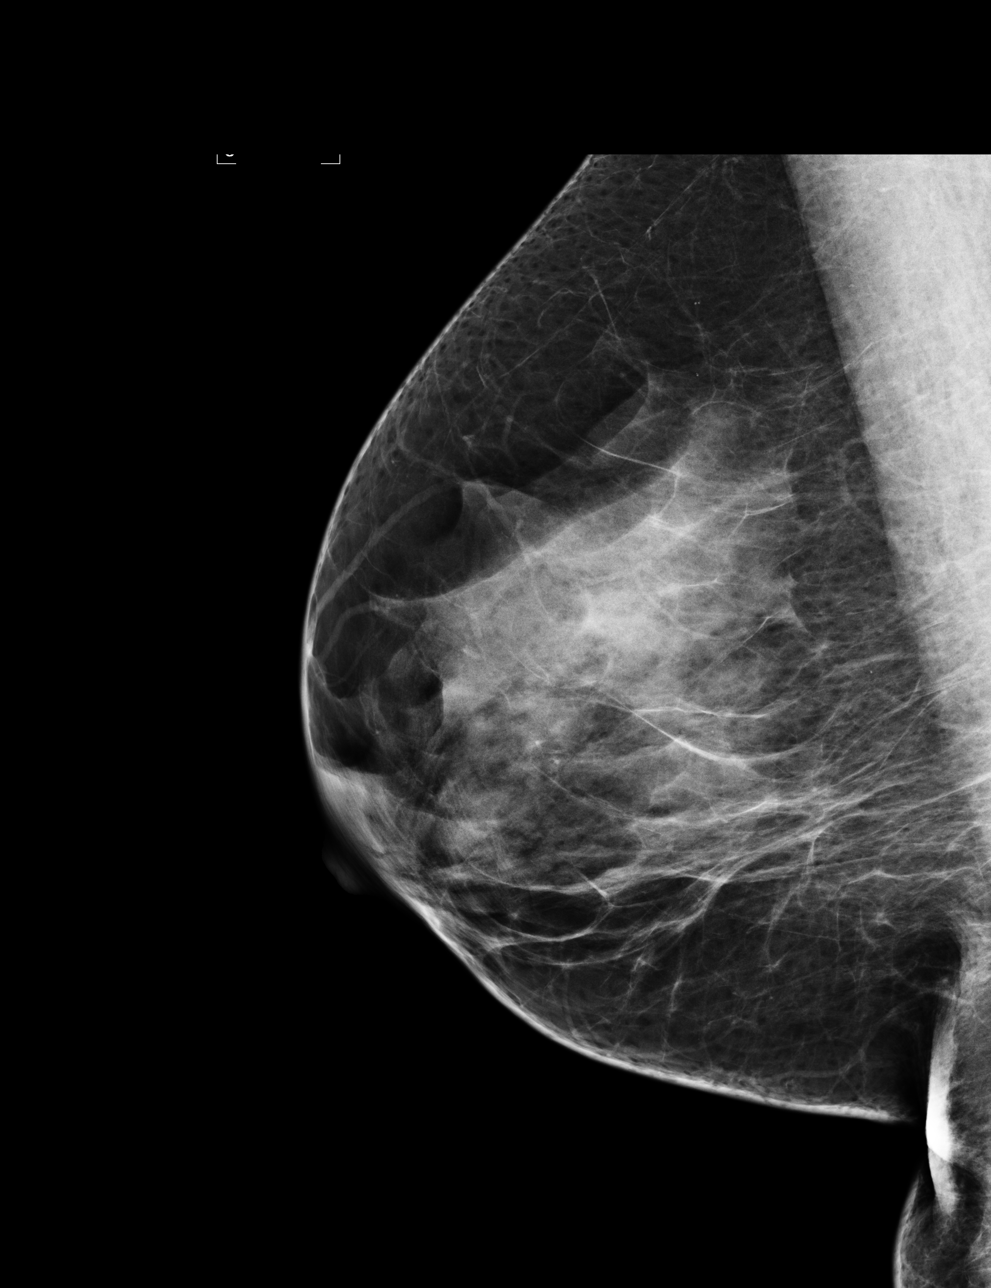

[4 of 4 positions shown; findings below may reference images not displayed]

FINDINGS: The breast tissue is heterogeneously dense. No
suspicious masses, architectural distortion, or calcifications are
present.

Images were processed with CAD.
IMPRESSION: No mammographic evidence of malignancy.

A result letter of this screening mammogram will be mailed directly
to the patient.

RECOMMENDATION:
Screening mammogram in one year. (Code:[id number])

BI-RADS CATEGORY 1:  Negative.

## 2014-07-15 ENCOUNTER — Telehealth (HOSPITAL_COMMUNITY): Payer: Self-pay | Admitting: Dietician

## 2014-07-15 NOTE — Telephone Encounter (Signed)
Received multiple messages from [name]. Called at 1447 and left message.

## 2014-07-15 NOTE — Telephone Encounter (Signed)
Nicole Gilbert returned call at 1455. 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   Informed Nicole Gilbert that this RD was not accepting new referrals or appointments, due to transition of services to [place]. Offered phone consult and she was agreeable.   She reports that she has had multiple hospitalizations in the past year and was told she had mild to moderate malnutrition. Chart review indicates that she has not been seen by an RD and she confirms this. Discussed criteria for malnutrition. She denies weight loss or decreased appetite. She reports "I really don't believe I have malnutrition".  She reports most recent wt of 153#, which was confirmed by EMR. She reports that she has been trying to lose weight intentionally, but has not gotten the results that she wants. She reports that she has implemented multiple interventions including monitoring exercise with FitBit, daily walking, Wii Fit, and water walking. She reports she has also tried the 10 Day Green Smoothie Diet, which she claims will help her lose 15# in 10 days.   Diet recall: Brekfast: Green smoothie Lunch: grilled chicken salad OR meat, beans, and greens Dinner: skip Snacks: sweets, cookies, apple with peanut butter Beverages: water. She reports she has cut out soft drinks.   Educated pt on principles of weight management. Discussed principles of energy expenditure and how changes in diet and physical activity affect weight status. Discussed nutritional content of commonly eaten foods and suggested healthier alternatives. Educated pt on plate method and a general, healthful diet that includes low fat dairy, lean meats, whole fruits and vegetables, and whole grains most often. Discussed importance of a healthy diet along with regular physical activity (at least 30 minutes 5 times per week) to achieve weight loss goals. Encouraged slow, moderate  weight loss (0.5-2# weight loss per week) and adopting healthy lifestyle changes vs. obtaining a certain body type or weight. Encouraged weighing self weekly at a consistent day and time of choice. Discussed functionality of MyFitnessPal and encouraged using a food diary to better track caloric intake. Used TeachBack to assess understanding.   Pt very grateful for information and for RD assessment.   Khandi Kernes A. Jimmye Norman, RD, LDN Pager: [phone]

## 2014-07-30 ENCOUNTER — Ambulatory Visit: Payer: Self-pay | Admitting: Gastroenterology

## 2014-09-15 ENCOUNTER — Ambulatory Visit (HOSPITAL_COMMUNITY): Payer: 59 | Attending: Physician Assistant

## 2014-09-15 ENCOUNTER — Emergency Department (HOSPITAL_COMMUNITY)
Admission: EM | Admit: 2014-09-15 | Discharge: 2014-09-15 | Disposition: A | Discharge status: Home / Self Care | Payer: 59 | Source: Home / Self Care | Attending: Emergency Medicine | Admitting: Emergency Medicine

## 2014-09-15 ENCOUNTER — Encounter (HOSPITAL_COMMUNITY): Payer: Self-pay | Admitting: Emergency Medicine

## 2014-09-15 DIAGNOSIS — K5909 Other constipation: Secondary | ICD-10-CM

## 2014-09-15 DIAGNOSIS — K5900 Constipation, unspecified: Secondary | ICD-10-CM

## 2014-09-15 DIAGNOSIS — R109 Unspecified abdominal pain: Secondary | ICD-10-CM

## 2014-09-15 DIAGNOSIS — R509 Fever, unspecified: Secondary | ICD-10-CM | POA: Diagnosis not present

## 2014-09-15 DIAGNOSIS — R110 Nausea: Secondary | ICD-10-CM | POA: Diagnosis not present

## 2014-09-15 LAB — POCT URINALYSIS DIP (DEVICE)
BILIRUBIN URINE: NEGATIVE
GLUCOSE, UA: NEGATIVE mg/dL
HGB URINE DIPSTICK: NEGATIVE
Ketones, ur: NEGATIVE mg/dL
Nitrite: NEGATIVE
Protein, ur: NEGATIVE mg/dL
Specific Gravity, Urine: 1.02 (ref 1.005–1.030)
Urobilinogen, UA: 0.2 mg/dL (ref 0.0–1.0)
pH: 7 (ref 5.0–8.0)

## 2014-09-15 NOTE — ED Provider Notes (Signed)
Medical screening examination/treatment/procedure(s) were performed by resident physician or non-physician practitioner and as supervising physician I was immediately available for consultation/collaboration.  Maryruth Eve, MD     Melony Overly, MD 09/15/14 [phone]

## 2014-09-15 NOTE — ED Notes (Signed)
Pt here this morning for abdominal pain and lower back pain, pt said that the pain is not present all the time but comes waves and feels like a spasm/cramp, pt also said that she had a brief period of chest pain and nausea this morning

## 2014-09-15 NOTE — Discharge Instructions (Signed)
Your ECG was normal. Your urine studies were normal. Your xrays were consistent with a large stool burden (constipation) and this is likely the explanation for your recent discomfort. Please resume using your Miralax and I would recommend using it once daily for the next seven days. If symptoms become suddenly worse, persistent or severe, please report to your nearest emergency room for assistance.   Constipation Constipation is when a person has fewer than three bowel movements a week, has difficulty having a bowel movement, or has stools that are dry, hard, or larger than normal. As people grow older, constipation is more common. If you try to fix constipation with medicines that make you have a bowel movement (laxatives), the problem may get worse. Long-term laxative use may cause the muscles of the colon to become weak. A low-fiber diet, not taking in enough fluids, and taking certain medicines may make constipation worse.  CAUSES   Certain medicines, such as antidepressants, pain medicine, iron supplements, antacids, and water pills.   Certain diseases, such as diabetes, irritable bowel syndrome (IBS), thyroid disease, or depression.   Not drinking enough water.   Not eating enough fiber-rich foods.   Stress or travel.   Lack of physical activity or exercise.   Ignoring the urge to have a bowel movement.   Using laxatives too much.  SIGNS AND SYMPTOMS   Having fewer than three bowel movements a week.   Straining to have a bowel movement.   Having stools that are hard, dry, or larger than normal.   Feeling full or bloated.   Pain in the lower abdomen.   Not feeling relief after having a bowel movement.  DIAGNOSIS  Your health care provider will take a medical history and perform a physical exam. Further testing may be done for severe constipation. Some tests may include:  A barium enema X-ray to examine your rectum, colon, and, sometimes, your small intestine.    A sigmoidoscopy to examine your lower colon.   A colonoscopy to examine your entire colon. TREATMENT  Treatment will depend on the severity of your constipation and what is causing it. Some dietary treatments include drinking more fluids and eating more fiber-rich foods. Lifestyle treatments may include regular exercise. If these diet and lifestyle recommendations do not help, your health care provider may recommend taking over-the-counter laxative medicines to help you have bowel movements. Prescription medicines may be prescribed if over-the-counter medicines do not work.  HOME CARE INSTRUCTIONS   Eat foods that have a lot of fiber, such as fruits, vegetables, whole grains, and beans.  Limit foods high in fat and processed sugars, such as french fries, hamburgers, cookies, candies, and soda.   A fiber supplement may be added to your diet if you cannot get enough fiber from foods.   Drink enough fluids to keep your urine clear or pale yellow.   Exercise regularly or as directed by your health care provider.   Go to the restroom when you have the urge to go. Do not hold it.   Only take over-the-counter or prescription medicines as directed by your health care provider. Do not take other medicines for constipation without talking to your health care provider first.  [place] IF:   You have bright red blood in your stool.   Your constipation lasts for more than 4 days or gets worse.   You have abdominal or rectal pain.   You have thin, pencil-like stools.   You have unexplained weight  loss. MAKE SURE YOU:   Understand these instructions.  Will watch your condition.  Will get help right away if you are not doing well or get worse. Document Released: 08/19/2004 Document Revised: 11/26/2013 Document Reviewed: 09/02/2013 [facility] Patient Information 2015 [place], [place]. This information is not intended to replace advice given to you by your health  care provider. Make sure you discuss any questions you have with your health care provider.  Diet and Irritable Bowel Syndrome  No cure has been found for irritable bowel syndrome (IBS). Many options are available to treat the symptoms. Your caregiver will give you the best treatments available for your symptoms. He or she will also encourage you to manage stress and to make changes to your diet. You need to work with your caregiver and Registered Dietician to find the best combination of medicine, diet, counseling, and support to control your symptoms. The following are some diet suggestions. FOODS THAT MAKE IBS WORSE  Fatty foods, such as [place] fries.  Milk products, such as cheese or ice cream.  Chocolate.  Alcohol.  Caffeine (found in coffee and some sodas).  Carbonated drinks, such as soda. If certain foods cause symptoms, you should eat less of them or stop eating them. FOOD JOURNAL   Keep a journal of the foods that seem to cause distress. Write down:  What you are eating during the day and when.  What problems you are having after eating.  When the symptoms occur in relation to your meals.  What foods always make you feel badly.  Take your notes with you to your caregiver to see if you should stop eating certain foods. FOODS THAT MAKE IBS BETTER Fiber reduces IBS symptoms, especially constipation, because it makes stools soft, bulky, and easier to pass. Fiber is found in bran, bread, cereal, beans, fruit, and vegetables. Examples of foods with fiber include:  Apples.  Peaches.  Pears.  Berries.  Figs.  Broccoli, raw.  Cabbage.  Carrots.  Raw peas.  Kidney beans.  Lima beans.  Whole-grain bread.  Whole-grain cereal. Add foods with fiber to your diet a little at a time. This will let your body get used to them. Too much fiber at once might cause gas and swelling of your abdomen. This can trigger symptoms in a person with IBS. Caregivers usually  recommend a diet with enough fiber to produce soft, painless bowel movements. High fiber diets may cause gas and bloating. However, these symptoms often go away within a few weeks, as your body adjusts. In many cases, dietary fiber may lessen IBS symptoms, particularly constipation. However, it may not help pain or diarrhea. High fiber diets keep the colon mildly enlarged (distended) with the added fiber. This may help prevent spasms in the colon. Some forms of fiber also keep water in the stool, thereby preventing hard stools that are difficult to pass.  Besides telling you to eat more foods with fiber, your caregiver may also tell you to get more fiber by taking a fiber pill or drinking water mixed with a special high fiber powder. An example of this is a natural fiber laxative containing psyllium seed.  TIPS  Large meals can cause cramping and diarrhea in people with IBS. If this happens to you, try eating 4 or 5 small meals a day, or try eating less at each of your usual 3 meals. It may also help if your meals are low in fat and high in carbohydrates. Examples of carbohydrates are pasta, rice,  whole-grain breads and cereals, fruits, and vegetables.  If dairy products cause your symptoms to flare up, you can try eating less of those foods. You might be able to handle yogurt better than other dairy products, because it contains bacteria that helps with digestion. Dairy products are an important source of calcium and other nutrients. If you need to avoid dairy products, be sure to talk with a Registered Dietitian about getting these nutrients through other food sources.  Drink enough water and fluids to keep your urine clear or pale yellow. This is important, especially if you have diarrhea. FOR MORE INFORMATION  International Foundation for Functional Gastrointestinal Disorders: www.iffgd.org  National Digestive Diseases Information Clearinghouse: digestive.AmenCredit.is Document Released: 02/11/2004  Document Revised: 02/13/2012 Document Reviewed: 02/21/2014 [facility] Patient Information 2015 [place], [place]. This information is not intended to replace advice given to you by your health care provider. Make sure you discuss any questions you have with your health care provider.

## 2014-09-15 NOTE — ED Provider Notes (Signed)
CSN: [id number]     Arrival date & time 09/15/14  0940 History   First MD Initiated Contact with Patient 09/15/14 1005     Chief Complaint  Patient presents with  . Abdominal Pain   (Consider location/radiation/quality/duration/timing/severity/associated sxs/prior Treatment) HPI Comments: PCP: Dr. Susann Givens GI: [facility]. Underwent extensive GI workup in 06/2014. All negative. Dx with chronic constipation. Uses Miralax and Linzess prn.  LNBM: yesterday S/P hysterectomy  Patient is a 53 y.o. female presenting with abdominal pain. The history is provided by the patient.  Abdominal Pain Pain location:  L flank, R flank, RLQ and LLQ Pain quality: cramping and sharp   Pain radiates to:  Does not radiate Pain severity:  Moderate Onset quality:  Gradual Duration:  1 week Timing:  Intermittent Progression:  Waxing and waning (+currently asymtomatic) Chronicity: +hx of chronic abdominal pain and constipation. Worsened by:  Eating Associated symptoms: nausea   Associated symptoms: no anorexia, no belching, no constipation, no cough, no diarrhea, no dysuria, no fatigue, no fever, no flatus, no hematemesis, no hematochezia, no hematuria, no melena, no shortness of breath, no sore throat, no vaginal bleeding, no vaginal discharge and no vomiting     Past Medical History  Diagnosis Date  . Anxiety   . Hemorrhoids 2006  . GERD (gastroesophageal reflux disease)   . Chronic constipation   . Hyperlipemia    Past Surgical History  Procedure Laterality Date  . Cesarean section      x2  . Hemorrhoid surgery  06/30/2008    Dr Arnoldo Morale  . Carpal tunnel right hand    . Knee arthroscopy      2010, RIGHT KNEE   . Esophagogastroduodenoscopy   01/20/2005    Rourk- Normal-appearing esophagus, status post passage of a 69 French Maloney  dilator/ small HH/  Otherwise normal stomach  . 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 Colonoscopy  08/22/2012    Rourk- hyperplastic polyps and tubular adenoma, query infectious colitis vs NSAID effect. Next TCS 08/2017.  . Abdominal hysterectomy    . Esophagogastroduodenoscopy N/A 02/06/2013    PIR:JJOACZ esophagus. Small hiatal hernia. Antral erosions: bx no h.pylori.  . Esophagogastroduodenoscopy N/A 06/25/2014    Procedure: ESOPHAGOGASTRODUODENOSCOPY (EGD);  Surgeon: Daneil Dolin, MD;  Location: [facility];  Service: Endoscopy;  Laterality: N/A;  2:45  . Esophageal biopsy  06/25/2014    Procedure: BIOPSY;  Surgeon: Daneil Dolin, MD;  Location: [facility]ITE;  Service: Endoscopy;;  Esophageal nodule   Family History  Problem Relation Age of Onset  . Colon cancer Brother 39    (paternal half brother)  . Diabetes Father   . Coronary artery disease Maternal Grandmother    History  Substance Use Topics  . Smoking status: Never Smoker   . Smokeless tobacco: Not on file  . Alcohol Use: Yes     Comment: rare   OB History   Grav Para Term Preterm Abortions TAB SAB Ect Mult Living                 Review of Systems  Constitutional: Negative for fever and fatigue.  HENT: Negative for sore throat.   Respiratory: Negative for cough and shortness of breath.   Gastrointestinal: Positive for nausea and abdominal pain. Negative for vomiting, diarrhea, constipation, melena, hematochezia, anorexia, flatus and hematemesis.  Genitourinary: Negative for  dysuria, hematuria, vaginal bleeding and vaginal discharge.  All other systems reviewed and are negative.   Allergies  Shellfish allergy  Home Medications   Prior to Admission medications   Medication Sig Start Date End Date Taking? Authorizing Provider  dexlansoprazole (DEXILANT) 60 MG capsule Take 1 capsule (60 mg total) by mouth daily. 06/24/14   Orvil Feil, NP  fish oil-omega-3 fatty acids 1000 MG capsule Take 2 g by mouth  daily.    Historical Provider, MD  ibuprofen (ADVIL,MOTRIN) 200 MG tablet Take 400 mg by mouth every 6 (six) hours as needed. For pain    Historical Provider, MD  Linaclotide (LINZESS) 290 MCG CAPS capsule Take 1 capsule (290 mcg total) by mouth daily. 08/21/13   Mahala Menghini, PA-C  Multiple Vitamins-Minerals (MULTIVITAMIN WITH MINERALS) tablet Take 1 tablet by mouth daily.    Historical Provider, MD  ondansetron (ZOFRAN) 4 MG tablet Take 1 tablet (4 mg total) by mouth every 8 (eight) hours as needed for nausea or vomiting. 06/23/14   Johnna Acosta, MD  pantoprazole (PROTONIX) 40 MG tablet TAKE 1 TABLET BY MOUTH ONCE DAILY 06/05/14   Orvil Feil, NP  Probiotic Product (PROBIOTIC DAILY PO) Take 1 tablet by mouth daily.     Historical Provider, MD  ranitidine (ZANTAC) 150 MG capsule Take 1 capsule (150 mg total) by mouth 2 (two) times daily. 06/23/14   Johnna Acosta, MD  sucralfate (CARAFATE) 1 GM/10ML suspension Take 10 mLs (1 g total) by mouth 4 (four) times daily. 06/24/14   Orvil Feil, NP   BP 137/92  Pulse 77  Temp(Src) 99 F (37.2 C) (Oral)  Resp 16  SpO2 100% Physical Exam  Nursing note and vitals reviewed. Constitutional: She is oriented to person, place, and time. She appears well-developed and well-nourished. No distress.  HENT:  Head: Normocephalic and atraumatic.  Eyes: Conjunctivae are normal. No scleral icterus.  Neck: Normal range of motion. Neck supple.  Cardiovascular: Normal rate, regular rhythm and normal heart sounds.   Pulmonary/Chest: Effort normal and breath sounds normal.  Abdominal: Soft. Normal appearance and bowel sounds are normal. There is no tenderness. There is no CVA tenderness.  No areas of tenderness at time of today's exam  Musculoskeletal: Normal range of motion.  Neurological: She is alert and oriented to person, place, and time.  Skin: Skin is warm and dry.  Psychiatric: She has a normal mood and affect. Her behavior is normal.    ED Course   Procedures (including critical care time) Labs Review Labs Reviewed  POCT URINALYSIS DIP (DEVICE) - Abnormal; Notable for the following:    Leukocytes, UA TRACE (*)    All other components within normal limits    Imaging Review Dg Abd 2 Views  09/15/2014   CLINICAL DATA:  Left-sided abdominal pain, nausea for 6 days. Low-grade fever.  EXAM: ABDOMEN - 2 VIEW  COMPARISON:  CT 01/23/2013  FINDINGS: Large stool burden throughout the colon. Nonobstructive bowel gas pattern. No free air organomegaly. Multiple calcified phleboliths within the pelvis. No acute bony abnormality. Visualized lung bases are clear.  IMPRESSION: Large stool burden.  No acute findings.   Electronically Signed   By: Rolm Baptise M.D.   On: 09/15/2014 11:39     MDM   1. Abdominal pain   2. Chronic constipation   ECG: NSR at 72 bpm and without acute ST/T wave changes. No ectopy. UA normal Abdominal films consistent with large stool burden. I suspect  this is the cause for her nausea and abdominal cramping with meal intake. Advised to return to using daily Miralax for the next 7 days and to follow up with PCP or GI if symptoms do not improve. Voices understanding that is symptoms become severe or persistent she is to report to her nearest ER for evaluation.    Lutricia Feil, [place] 09/15/14 1215

## 2014-09-17 ENCOUNTER — Other Ambulatory Visit: Payer: Self-pay | Admitting: Women's Health

## 2014-09-20 ENCOUNTER — Encounter (HOSPITAL_COMMUNITY): Payer: Self-pay | Admitting: Emergency Medicine

## 2014-09-20 ENCOUNTER — Emergency Department (HOSPITAL_COMMUNITY): Payer: 59

## 2014-09-20 ENCOUNTER — Emergency Department (HOSPITAL_COMMUNITY)
Admission: EM | Admit: 2014-09-20 | Discharge: 2014-09-20 | Disposition: A | Discharge status: Home / Self Care | Payer: 59 | Attending: Emergency Medicine | Admitting: Emergency Medicine

## 2014-09-20 DIAGNOSIS — Z3202 Encounter for pregnancy test, result negative: Secondary | ICD-10-CM | POA: Insufficient documentation

## 2014-09-20 DIAGNOSIS — R51 Headache: Secondary | ICD-10-CM | POA: Insufficient documentation

## 2014-09-20 DIAGNOSIS — S39012A Strain of muscle, fascia and tendon of lower back, initial encounter: Secondary | ICD-10-CM

## 2014-09-20 DIAGNOSIS — Z90710 Acquired absence of both cervix and uterus: Secondary | ICD-10-CM | POA: Insufficient documentation

## 2014-09-20 DIAGNOSIS — K219 Gastro-esophageal reflux disease without esophagitis: Secondary | ICD-10-CM | POA: Diagnosis not present

## 2014-09-20 DIAGNOSIS — R109 Unspecified abdominal pain: Secondary | ICD-10-CM | POA: Diagnosis not present

## 2014-09-20 DIAGNOSIS — R110 Nausea: Secondary | ICD-10-CM | POA: Diagnosis not present

## 2014-09-20 DIAGNOSIS — Z9889 Other specified postprocedural states: Secondary | ICD-10-CM | POA: Diagnosis not present

## 2014-09-20 DIAGNOSIS — Z79899 Other long term (current) drug therapy: Secondary | ICD-10-CM | POA: Diagnosis not present

## 2014-09-20 DIAGNOSIS — K5900 Constipation, unspecified: Secondary | ICD-10-CM | POA: Insufficient documentation

## 2014-09-20 DIAGNOSIS — Z8659 Personal history of other mental and behavioral disorders: Secondary | ICD-10-CM | POA: Diagnosis not present

## 2014-09-20 DIAGNOSIS — Z8639 Personal history of other endocrine, nutritional and metabolic disease: Secondary | ICD-10-CM | POA: Diagnosis not present

## 2014-09-20 LAB — PREGNANCY, URINE: PREG TEST UR: NEGATIVE

## 2014-09-20 LAB — CBC WITH DIFFERENTIAL/PLATELET
Basophils Absolute: 0 10*3/uL (ref 0.0–0.1)
Basophils Relative: 1 % (ref 0–1)
Eosinophils Absolute: 0.1 10*3/uL (ref 0.0–0.7)
Eosinophils Relative: 1 % (ref 0–5)
HCT: 38.7 % (ref 36.0–46.0)
HEMOGLOBIN: 12.6 g/dL (ref 12.0–15.0)
LYMPHS PCT: 50 % — AB (ref 12–46)
Lymphs Abs: 2.2 10*3/uL (ref 0.7–4.0)
MCH: 25.4 pg — AB (ref 26.0–34.0)
MCHC: 32.6 g/dL (ref 30.0–36.0)
MCV: 78 fL (ref 78.0–100.0)
MONOS PCT: 11 % (ref 3–12)
Monocytes Absolute: 0.5 10*3/uL (ref 0.1–1.0)
NEUTROS PCT: 37 % — AB (ref 43–77)
Neutro Abs: 1.6 10*3/uL — ABNORMAL LOW (ref 1.7–7.7)
Platelets: 280 10*3/uL (ref 150–400)
RBC: 4.96 MIL/uL (ref 3.87–5.11)
RDW: 14.3 % (ref 11.5–15.5)
WBC: 4.3 10*3/uL (ref 4.0–10.5)

## 2014-09-20 LAB — COMPREHENSIVE METABOLIC PANEL
ALK PHOS: 69 U/L (ref 39–117)
ALT: 18 U/L (ref 0–35)
ANION GAP: 11 (ref 5–15)
AST: 22 U/L (ref 0–37)
Albumin: 3.7 g/dL (ref 3.5–5.2)
BILIRUBIN TOTAL: 0.5 mg/dL (ref 0.3–1.2)
BUN: 15 mg/dL (ref 6–23)
CHLORIDE: 105 meq/L (ref 96–112)
CO2: 26 mEq/L (ref 19–32)
Calcium: 10 mg/dL (ref 8.4–10.5)
Creatinine, Ser: 0.77 mg/dL (ref 0.50–1.10)
GFR calc non Af Amer: >90 mL/min (ref >90)
GLUCOSE: 90 mg/dL (ref 70–99)
POTASSIUM: 4.3 meq/L (ref 3.7–5.3)
Sodium: 142 mEq/L (ref 137–147)
Total Protein: 8.3 g/dL (ref 6.0–8.3)

## 2014-09-20 LAB — URINALYSIS, ROUTINE W REFLEX MICROSCOPIC
BILIRUBIN URINE: NEGATIVE
GLUCOSE, UA: NEGATIVE mg/dL
HGB URINE DIPSTICK: NEGATIVE
Ketones, ur: NEGATIVE mg/dL
Leukocytes, UA: NEGATIVE
Nitrite: NEGATIVE
Protein, ur: NEGATIVE mg/dL
Specific Gravity, Urine: 1.022 (ref 1.005–1.030)
UROBILINOGEN UA: 0.2 mg/dL (ref 0.0–1.0)
pH: 6 (ref 5.0–8.0)

## 2014-09-20 LAB — LIPASE, BLOOD: Lipase: 58 U/L (ref 11–59)

## 2014-09-20 MED ORDER — KETOROLAC TROMETHAMINE 30 MG/ML IJ SOLN
15.0000 mg | Freq: Once | INTRAMUSCULAR | Status: AC
  Administered 2014-09-20: 15 mg via INTRAVENOUS
  Filled 2014-09-20: qty 1

## 2014-09-20 MED ORDER — METOCLOPRAMIDE HCL 10 MG PO TABS: 10.0000 mg | ORAL_TABLET | Freq: Four times a day (QID) | ORAL | Status: DC

## 2014-09-20 NOTE — ED Notes (Signed)
Per pt sts she has been having LLQ pain radiating in to flank area. sts some nausea. sts she was seen for the same on Monday. sts all tests came back negative Monday.

## 2014-09-20 NOTE — ED Notes (Signed)
Dr Zavitz at bedside  

## 2014-09-20 NOTE — ED Provider Notes (Signed)
CSN: [id number]     Arrival date & time 09/20/14  1301 History   First MD Initiated Contact with Patient 09/20/14 1504     Chief Complaint  Patient presents with  . Abdominal Pain     (Consider location/radiation/quality/duration/timing/severity/associated sxs/prior Treatment) HPI Comments: 53 year old female with history of anxiety, constipation, reflux, hysterectomy, colonoscopy in the past 2 years unremarkable per patient presents with intermittent left flank pain mild radiation to the groin and left back for the past 2 weeks. Patient feels this is new for her and she's never had this before. No history of kidney stones. Patient has lower paraspinal back pain worse with palpation. No history of cancer, leg weakness or numbness, injuries or bowel or bladder changes. Patient was seen in outpatient recently and urine was unremarkable.  Patient is a 53 y.o. female presenting with abdominal pain. The history is provided by the patient.  Abdominal Pain Associated symptoms: nausea   Associated symptoms: no chest pain, no chills, no dysuria, no fever, no shortness of breath and no vomiting     Past Medical History  Diagnosis Date  . Anxiety   . Hemorrhoids 2006  . GERD (gastroesophageal reflux disease)   . Chronic constipation   . Hyperlipemia    Past Surgical History  Procedure Laterality Date  . Cesarean section      x2  . Hemorrhoid surgery  06/30/2008    Dr Arnoldo Morale  . Carpal tunnel right hand    . Knee arthroscopy      2010, RIGHT KNEE   . Esophagogastroduodenoscopy   01/20/2005    Rourk- Normal-appearing esophagus, status post passage of a 93 French Maloney  dilator/ small HH/  Otherwise normal stomach  . 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 Colonoscopy  08/22/2012    Rourk- hyperplastic polyps and tubular adenoma, query  infectious colitis vs NSAID effect. Next TCS 08/2017.  . Abdominal hysterectomy    . Esophagogastroduodenoscopy N/A 02/06/2013    SWN:IOEVOJ esophagus. Small hiatal hernia. Antral erosions: bx no h.pylori.  . Esophagogastroduodenoscopy N/A 06/25/2014    Procedure: ESOPHAGOGASTRODUODENOSCOPY (EGD);  Surgeon: Daneil Dolin, MD;  Location: [facility];  Service: Endoscopy;  Laterality: N/A;  2:45  . Esophageal biopsy  06/25/2014    Procedure: BIOPSY;  Surgeon: Daneil Dolin, MD;  Location: [facility];  Service: Endoscopy;;  Esophageal nodule   Family History  Problem Relation Age of Onset  . Colon cancer Brother 68    (paternal half brother)  . Diabetes Father   . Coronary artery disease Maternal Grandmother    History  Substance Use Topics  . Smoking status: Never Smoker   . Smokeless tobacco: Not on file  . Alcohol Use: Yes     Comment: rare   OB History   Grav Para Term Preterm Abortions TAB SAB Ect Mult Living                 Review of Systems  Constitutional: Negative for fever and chills.  HENT: Negative for congestion.   Eyes: Negative for visual disturbance.  Respiratory: Negative for shortness of breath.   Cardiovascular: Negative for chest pain.  Gastrointestinal: Positive for nausea and abdominal pain. Negative for vomiting.  Genitourinary: Positive for flank pain. Negative for dysuria.  Musculoskeletal: Positive for back pain. Negative for neck pain and neck stiffness.  Skin: Negative for  rash.  Neurological: Positive for headaches (intermittent gradual onset for weeks). Negative for light-headedness.      Allergies  Shellfish allergy  Home Medications   Prior to Admission medications   Medication Sig Start Date End Date Taking? Authorizing Provider  dexlansoprazole (DEXILANT) 60 MG capsule Take 1 capsule (60 mg total) by mouth daily. 06/24/14   Orvil Feil, NP  fish oil-omega-3 fatty acids 1000 MG capsule Take 2 g by mouth daily.    Historical Provider,  MD  ibuprofen (ADVIL,MOTRIN) 200 MG tablet Take 400 mg by mouth every 6 (six) hours as needed. For pain    Historical Provider, MD  Linaclotide (LINZESS) 290 MCG CAPS capsule Take 1 capsule (290 mcg total) by mouth daily. 08/21/13   Mahala Menghini, PA-C  Multiple Vitamins-Minerals (MULTIVITAMIN WITH MINERALS) tablet Take 1 tablet by mouth daily.    Historical Provider, MD  ondansetron (ZOFRAN) 4 MG tablet Take 1 tablet (4 mg total) by mouth every 8 (eight) hours as needed for nausea or vomiting. 06/23/14   Johnna Acosta, MD  pantoprazole (PROTONIX) 40 MG tablet TAKE 1 TABLET BY MOUTH ONCE DAILY 06/05/14   Orvil Feil, NP  Probiotic Product (PROBIOTIC DAILY PO) Take 1 tablet by mouth daily.     Historical Provider, MD  ranitidine (ZANTAC) 150 MG capsule Take 1 capsule (150 mg total) by mouth 2 (two) times daily. 06/23/14   Johnna Acosta, MD  sucralfate (CARAFATE) 1 GM/10ML suspension Take 10 mLs (1 g total) by mouth 4 (four) times daily. 06/24/14   Orvil Feil, NP   BP 124/79  Pulse 96  Temp(Src) 97.7 F (36.5 C) (Oral)  Resp 15  Ht 5\' 1"  (1.549 m)  Wt 148 lb (67.132 kg)  BMI 27.98 kg/m2  SpO2 99% Physical Exam  Nursing note and vitals reviewed. Constitutional: She is oriented to person, place, and time. She appears well-developed and well-nourished.  HENT:  Head: Normocephalic and atraumatic.  Eyes: Conjunctivae are normal. Right eye exhibits no discharge. Left eye exhibits no discharge.  Neck: Normal range of motion. Neck supple. No tracheal deviation present.  Cardiovascular: Normal rate and regular rhythm.   Pulmonary/Chest: Effort normal and breath sounds normal.  Abdominal: Soft. She exhibits no distension. There is tenderness (very mild left mid flank). There is no guarding.  Musculoskeletal: She exhibits no edema.  Mild paraspinal lower lumbar tenderness no midline tenderness.  Neurological: She is alert and oriented to person, place, and time. No cranial nerve deficit. GCS eye  subscore is 4. GCS verbal subscore is 5. GCS motor subscore is 6.  Reflex Scores:      Patellar reflexes are 2+ on the right side and 2+ on the left side. Patient has 5+ strength lower extremities with flexion extension bilateral hips knees and great toes   Skin: Skin is warm. No rash noted.  Psychiatric: She has a normal mood and affect.    ED Course  Procedures (including critical care time) Emergency Focused Ultrasound Exam Limited retroperitoneal ultrasound of kidneys  Performed and interpreted by Dr. Reather Converse Indication: flank pain Focused abdominal ultrasound with both kidneys imaged in transverse and longitudinal planes in real-time. Interpretation: No hydronephrosis visualized.   Images archived electronically  Labs Review Labs Reviewed  CBC WITH DIFFERENTIAL - Abnormal; Notable for the following:    MCH 25.4 (*)    Neutrophils Relative % 37 (*)    Neutro Abs 1.6 (*)    Lymphocytes Relative 50 (*)  All other components within normal limits  URINALYSIS, ROUTINE W REFLEX MICROSCOPIC - Abnormal; Notable for the following:    APPearance CLOUDY (*)    All other components within normal limits  COMPREHENSIVE METABOLIC PANEL  LIPASE, BLOOD  PREGNANCY, URINE    Imaging Review Ct Renal Stone Study  09/20/2014   CLINICAL DATA:  53 year old female with right hip pain radiating across lower back into left hip. Nausea. Pain intermittent for the past 2 weeks. Prior hysterectomy. Subsequent encounter.  EXAM: CT ABDOMEN AND PELVIS WITHOUT CONTRAST  TECHNIQUE: Multidetector CT imaging of the abdomen and pelvis was performed following the standard protocol without IV contrast.  COMPARISON:  09/15/2014 plain film exam.  01/23/2013 CT.  FINDINGS: 3.4 mm posterior left lung base nodule unchanged compared to 2005 exam. Right middle lobe nodule not visualized.  Slightly asymmetric breast parenchyma more notable on left incompletely assessed. This may be normal for this patient. Correlation  with mammography may be considered.  Taking into account limitation of the noncontrast examination, no extra luminal bowel inflammatory process, free fluid or free air is noted. Appendix not visualized and may been removed at the time hysterectomy. No obvious hepatic, splenic, pancreatic, renal or adrenal abnormality. No calcified gallstones.  No abdominal aortic aneurysm.  No adenopathy.  No osseous destructive lesion.  No obvious disc herniation.  Prominent vessel courses from the spleen to the left inguinal region unchanged from 2010 and therefore may be an incidental finding.  There may be a very small hiatal hernia.  IMPRESSION: No acute abnormality noted.  Please see above discussion.   Electronically Signed   By: Chauncey Cruel M.D.   On: 09/20/2014 16:41     EKG Interpretation None      MDM   Final diagnoses:  Abdominal pain  Left flank pain  Lumbar strain, initial encounter   Patient with intermittent flank pain worsening for 2 weeks discussed differential diagnosis, bedside ultrasound no significant hydronephrosis. CT scan for further detail and discussed outpatient followup.  CT results reviewed no acute findings. Patient is having minimal discomfort in the ER and well-appearing. Patient is safe to followup outpatient for nonspecific flank and back pain. Results and differential diagnosis were discussed with the patient/parent/guardian. Close follow up outpatient was discussed, comfortable with the plan.   Medications  ketorolac (TORADOL) 30 MG/ML injection 15 mg (not administered)    Filed Vitals:   09/20/14 1306  BP: 124/79  Pulse: 96  Temp: 97.7 F (36.5 C)  TempSrc: Oral  Resp: 15  Height: 5\' 1"  (1.549 m)  Weight: 148 lb (67.132 kg)  SpO2: 99%    Final diagnoses:  Abdominal pain     For  Mariea Clonts, MD 09/20/14 1653

## 2014-09-20 NOTE — ED Notes (Signed)
Patient transported to CT 

## 2014-09-20 NOTE — Discharge Instructions (Signed)
You can try Reglan and Benadryl for headaches in addition to over-the-counter medicines. Followup closely with your Dr. for further evaluation of your pain.  If you were given medicines take as directed.  If you are on coumadin or contraceptives realize their levels and effectiveness is altered by many different medicines.  If you have any reaction (rash, tongues swelling, other) to the medicines stop taking and see a physician.   Please follow up as directed and return to the ER or see a physician for new or worsening symptoms.  Thank you. Filed Vitals:   09/20/14 1306 09/20/14 1604  BP: 124/79 120/79  Pulse: 96   Temp: 97.7 F (36.5 C)   TempSrc: Oral   Resp: 15   Height: 5\' 1"  (1.549 m)   Weight: 148 lb (67.132 kg)   SpO2: 99%

## 2014-10-09 ENCOUNTER — Encounter: Payer: Self-pay | Admitting: Orthopedic Surgery

## 2014-10-09 ENCOUNTER — Ambulatory Visit (INDEPENDENT_AMBULATORY_CARE_PROVIDER_SITE_OTHER): Payer: 59 | Admitting: Orthopedic Surgery

## 2014-10-09 ENCOUNTER — Ambulatory Visit (INDEPENDENT_AMBULATORY_CARE_PROVIDER_SITE_OTHER): Payer: 59

## 2014-10-09 VITALS — BP 133/79 | Ht 61.0 in | Wt 148.0 lb

## 2014-10-09 DIAGNOSIS — M1711 Unilateral primary osteoarthritis, right knee: Secondary | ICD-10-CM

## 2014-10-09 DIAGNOSIS — M5441 Lumbago with sciatica, right side: Secondary | ICD-10-CM

## 2014-10-09 MED ORDER — GABAPENTIN 100 MG PO CAPS: 100.0000 mg | ORAL_CAPSULE | Freq: Every day | ORAL | Status: DC

## 2014-10-09 NOTE — Patient Instructions (Signed)
Call to arrange therapy at [facility] outpatient therapy dept

## 2014-10-09 NOTE — Progress Notes (Signed)
Patient ID: Nicole Gilbert, female   DOB: Feb 03, 1961, 53 y.o.   MRN: [id number] Chief Complaint  Patient presents with  . 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  She now complains of pain running down her leg with associated back pain then occasional symptoms in her right knee with some swelling which comes and goes she is not on any current medications for arthritis.  She takes ibuprofen or Aleve as needed. We did have her on gabapentin at one time which did well also.  Current review of systems she has no fever or chills night sweats or other radicular symptoms her gait is normal as well there are no bowel or bladder dysfunctional issue she was seen in the emergency room for what appeared to be maybe a kidney problem or stone.  Past Medical History  Diagnosis Date  . Anxiety   . Hemorrhoids 2006  . GERD (gastroesophageal reflux disease)   . Chronic constipation   . Hyperlipemia    Past Surgical History  Procedure Laterality Date  . Cesarean section      x2  . Hemorrhoid surgery  06/30/2008    Dr Arnoldo Morale  . Carpal tunnel right hand    . Knee arthroscopy      2010, RIGHT KNEE   . Esophagogastroduodenoscopy   01/20/2005    Rourk- Normal-appearing esophagus, status post passage of a 1 French Maloney  dilator/ small HH/  Otherwise normal stomach  . Colonoscopy   05/19/2008    Rourk-Prominent internal hemorrhoids and anal papilla, otherwise normal appearing rectal mucosa / Colonic mucosa appeared normal except for melanosis coli   . Colonoscopy  08/22/2012    Rourk- hyperplastic polyps and tubular adenoma, query infectious colitis vs NSAID effect. Next TCS 08/2017.  . Abdominal hysterectomy    . Esophagogastroduodenoscopy N/A 02/06/2013    WUJ:[id number] esophagus. Small hiatal hernia. Antral erosions: bx no h.pylori.  . Esophagogastroduodenoscopy N/A 06/25/2014    Procedure: ESOPHAGOGASTRODUODENOSCOPY (EGD);  Surgeon: Daneil Dolin, MD;  Location: [facility];  Service: Endoscopy;  Laterality: N/A;  2:45  . Esophageal biopsy  06/25/2014    Procedure: BIOPSY;  Surgeon: Daneil Dolin, MD;  Location: [facility];  Service: Endoscopy;;  Esophageal nodule    BP 133/79 mmHg  Ht 5\' 1"  (1.549 m)  Wt 148 lb (67.132 kg)  BMI 27.98 kg/m2 She presents as a well-developed well-nourished female awake alert and oriented 3 with her body habitus as mesomorphic and a normal BMI.  She is walking without support. No noticeable abnormality  She does have lumbar spine back pain with decreased range of motion increase muscle tension and tenderness over the L5-S1 junction and right gluteal area.  As far as her knee goes she has no joint effusion today full range of motion the joint is stable strength is normal negative McMurray skin intact  Distal pulses are normal.  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  We discussed various treatment options and decided to start lumbar stabilization continue ibuprofen for the back use Aleve for the knee use gabapentin for the sciatica and follow-up with [place] on an as-needed basis

## 2014-10-17 ENCOUNTER — Telehealth: Payer: Self-pay | Admitting: Orthopedic Surgery

## 2014-10-17 ENCOUNTER — Encounter: Payer: Self-pay | Admitting: *Deleted

## 2014-10-17 NOTE — Telephone Encounter (Signed)
Physical Therapy ordered at 11/05 office visit, PT cant get her in before 11/25 shes asking can she do home exercises instead, Please advise?

## 2014-10-17 NOTE — Telephone Encounter (Signed)
Routing to Dr Harrison 

## 2014-10-17 NOTE — Telephone Encounter (Signed)
Try [facility] if not then yes

## 2014-10-20 NOTE — Telephone Encounter (Signed)
Called patient, no answer, left vm 

## 2014-10-21 ENCOUNTER — Other Ambulatory Visit: Payer: Self-pay | Admitting: *Deleted

## 2014-10-21 DIAGNOSIS — M5441 Lumbago with sciatica, right side: Secondary | ICD-10-CM

## 2014-10-21 NOTE — Telephone Encounter (Signed)
Patient aware Order faxed to [facility] They have appt this week and will call patient with appt

## 2014-10-28 ENCOUNTER — Ambulatory Visit (HOSPITAL_COMMUNITY): Payer: 59 | Admitting: Physical Therapy

## 2014-10-29 ENCOUNTER — Encounter: Payer: Self-pay | Admitting: Gastroenterology

## 2014-10-29 ENCOUNTER — Ambulatory Visit (INDEPENDENT_AMBULATORY_CARE_PROVIDER_SITE_OTHER): Payer: 59 | Admitting: Gastroenterology

## 2014-10-29 ENCOUNTER — Other Ambulatory Visit: Payer: Self-pay

## 2014-10-29 VITALS — BP 124/84 | HR 77 | Temp 97.8°F | Ht 61.0 in | Wt 160.0 lb

## 2014-10-29 DIAGNOSIS — R1013 Epigastric pain: Secondary | ICD-10-CM | POA: Insufficient documentation

## 2014-10-29 DIAGNOSIS — R109 Unspecified abdominal pain: Secondary | ICD-10-CM

## 2014-10-29 DIAGNOSIS — R1032 Left lower quadrant pain: Secondary | ICD-10-CM

## 2014-10-29 DIAGNOSIS — K5900 Constipation, unspecified: Secondary | ICD-10-CM

## 2014-10-29 NOTE — Progress Notes (Signed)
Referring Provider: [name], MD Primary Care Physician:  [name], MD  Chief Complaint  Patient presents with  . Follow-up    ER Visit Abd Pain    HPI:   Nicole Gilbert presents today with history of constipation, abdominal pain, recent EGD performed due to dyspepsia with benign esophageal nodule. US abdomen normal. Notes pain in left side/lower quadrant to left flank and across back. Pain intermittent. No relation to bowel habits.   Pain was so bad couldn't bend over to put clothes on. Pain has been recurrent for at least a month. Pain some improvement since initial onset. Tries to watch what she eats. No fever/chills. Probiotic daily. Miralax daily. Has a BM every day. Linzess once per week. Still feels like she has more left, not as productive as it should be, then will take the [place].    Taking Dexilant and Protonix. Takes Protonix and Dexilant together in the morning. Abdominal pain with eating. Green juicing. Epigastric pain with eating.    Past Medical History  Diagnosis Date  . Anxiety   . Hemorrhoids 2006  . GERD (gastroesophageal reflux disease)   . Chronic constipation   . Hyperlipemia     Past Surgical History  Procedure Laterality Date  . Cesarean section      x2  . Hemorrhoid surgery  06/30/2008    Dr Arnoldo Morale  . Carpal tunnel right hand    . Knee arthroscopy      2010, RIGHT KNEE   . Esophagogastroduodenoscopy   01/20/2005    Rourk- Normal-appearing esophagus, status post passage of a 15 French Maloney  dilator/ small HH/  Otherwise normal stomach  . 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 Colonoscopy  08/22/2012    Rourk- hyperplastic polyps and tubular adenoma, query infectious colitis vs NSAID effect. Next TCS 08/2017.  . Abdominal hysterectomy    . Esophagogastroduodenoscopy N/A 02/06/2013   QMG:[id number] esophagus. Small hiatal hernia. Antral erosions: bx no h.pylori.  . Esophagogastroduodenoscopy N/A 06/25/2014    Dr. Myra Gianotti nodule-status post biopsy; otherwise, normalEGD. No endoscopic explanation for patient's symptoms  . Esophageal biopsy  06/25/2014    Dr. Geanie Logan features are not identified    Current Outpatient Prescriptions  Medication Sig Dispense Refill  . dexlansoprazole (DEXILANT) 60 MG capsule Take 1 capsule (60 mg total) by mouth daily. 30 capsule 3  . gabapentin (NEURONTIN) 100 MG capsule Take 1 capsule (100 mg total) by mouth at bedtime. 30 capsule 2  . ibuprofen (ADVIL,MOTRIN) 200 MG tablet Take 400 mg by mouth every 6 (six) hours as needed. For pain    . Linaclotide (LINZESS) 290 MCG CAPS capsule Take 290 mcg by mouth See admin instructions. Take once a week per patient. No specific day    . Multiple Vitamins-Minerals (MULTIVITAMIN WITH MINERALS) tablet Take 1 tablet by mouth daily.    . ondansetron (ZOFRAN) 4 MG tablet Take 1 tablet (4 mg total) by mouth every 8 (eight) hours as needed for nausea or vomiting. 10 tablet 0  . pantoprazole (PROTONIX) 40 MG tablet TAKE 1 TABLET BY MOUTH ONCE DAILY 64 tablet 3  . Probiotic Product (PROBIOTIC DAILY PO) Take 1 tablet by mouth daily.     . ranitidine (ZANTAC) 150 MG capsule Take 150 mg by mouth daily as needed for heartburn.    . fish oil-omega-3 fatty acids 1000  MG capsule Take 2 g by mouth daily.    . metoCLOPramide (REGLAN) 10 MG tablet Take 1 tablet (10 mg total) by mouth every 6 (six) hours. (Patient not taking: Reported on 10/29/2014) 6 tablet 0  . sucralfate (CARAFATE) 1 GM/10ML suspension Take 10 mLs (1 g total) by mouth 4 (four) times daily. (Patient not taking: Reported on 10/29/2014) 420 mL 1   No current facility-administered medications for this visit.    Allergies as of 10/29/2014 - Review Complete 10/29/2014  Allergen Reaction Noted  . Shellfish allergy Hives and Itching 08/13/2012     Family History  Problem Relation Age of Onset  . Colon cancer Brother 65    (paternal half brother)  . Diabetes Father   . Coronary artery disease Maternal Grandmother     History   Social History  . Marital Status: Divorced    Spouse Name: N/A    Number of Children: 2  . Years of Education: N/A   Occupational History  . nursing assistant, [employer]   .     Social History Main Topics  . Smoking status: Never Smoker   . Smokeless tobacco: None  . Alcohol Use: Yes     Comment: rare  . Drug Use: No  . Sexual Activity: Yes    Birth Control/ Protection: None   Other Topics Concern  . None   Social History Narrative    Review of Systems: As mentioned in HPI.   Physical Exam: BP 124/84 mmHg  Pulse 77  Temp(Src) 97.8 F (36.6 C) (Oral)  Ht 5\' 1"  (1.549 m)  Wt 160 lb (72.576 kg)  BMI 30.25 kg/m2 General:   Alert and oriented. No distress noted. Pleasant and cooperative.  Head:  Normocephalic and atraumatic. Eyes:  Conjuctiva clear without scleral icterus. Mouth:  Oral mucosa pink and moist. Good dentition. No lesions. Abdomen:  +BS, soft, non-tender and non-distended. No rebound or guarding. No HSM or masses noted. Msk:  Symmetrical without gross deformities. Normal posture. Extremities:  Without edema. Neurologic:  Alert and  oriented x4;  grossly normal neurologically. Skin:  Intact without significant lesions or rashes. Psych:  Alert and cooperative. Normal mood and affect.

## 2014-10-29 NOTE — Patient Instructions (Signed)
Trial of Amitiza 8 mcg by mouth each evening WITH FOOD for 3 evenings then increase to twice a day WITH FOOD. Please let us know how this works for you.  We have ordered a HIDA scan to further evaluate your gallbladder.  Further recommendations to follow.

## 2014-11-03 ENCOUNTER — Telehealth: Payer: Self-pay

## 2014-11-03 NOTE — Telephone Encounter (Signed)
Pt called and is aware of her appointment

## 2014-11-03 NOTE — Telephone Encounter (Signed)
LMOM for pt to call office for information regarding her HIDA scan. I also mailed a letter to remind pt.

## 2014-11-05 NOTE — Assessment & Plan Note (Signed)
EGD on file, [place] of abdomen unimpressive. Proceed with HIDA scan to assess for biliary etiology.

## 2014-11-05 NOTE — Assessment & Plan Note (Signed)
Chronic, intermittent, colonoscopy up-to-date. History of constipation, with reports of unproductive bowel movements. Start Amitiza 8 mcg po BID, samples provided. May also be musculoskeletal etiology, as she notes left lower back/left side pain. No concerning features on exam.

## 2014-11-06 NOTE — Progress Notes (Signed)
cc'ed to pcp °

## 2014-11-10 ENCOUNTER — Encounter (HOSPITAL_COMMUNITY)
Admission: RE | Admit: 2014-11-10 | Discharge: 2014-11-10 | Disposition: A | Discharge status: Home / Self Care | Payer: 59 | Source: Ambulatory Visit | Attending: Gastroenterology | Admitting: Gastroenterology

## 2014-11-10 ENCOUNTER — Encounter (HOSPITAL_COMMUNITY): Payer: Self-pay

## 2014-11-10 DIAGNOSIS — R109 Unspecified abdominal pain: Secondary | ICD-10-CM | POA: Diagnosis not present

## 2014-11-10 DIAGNOSIS — K5900 Constipation, unspecified: Secondary | ICD-10-CM

## 2014-11-10 MED ORDER — SINCALIDE 5 MCG IJ SOLR
INTRAMUSCULAR | Status: AC
  Administered 2014-11-10: 1.4 ug
  Filled 2014-11-10: qty 5

## 2014-11-10 MED ORDER — STERILE WATER FOR INJECTION IJ SOLN
INTRAMUSCULAR | Status: AC
  Administered 2014-11-10: 1.4 mL
  Filled 2014-11-10: qty 10

## 2014-11-10 MED ORDER — TECHNETIUM TC 99M MEBROFENIN IV KIT
5.0000 | PACK | Freq: Once | INTRAVENOUS | Status: AC | PRN
  Administered 2014-11-10: 5 via INTRAVENOUS

## 2014-11-10 MED ORDER — SODIUM CHLORIDE 0.9 % IJ SOLN
INTRAMUSCULAR | Status: AC
  Filled 2014-11-10: qty 100

## 2014-11-10 MED ORDER — SODIUM CHLORIDE 0.9 % IJ SOLN
INTRAMUSCULAR | Status: AC
  Filled 2014-11-10: qty 30

## 2014-11-12 ENCOUNTER — Telehealth: Payer: Self-pay | Admitting: Internal Medicine

## 2014-11-12 NOTE — Telephone Encounter (Signed)
Routing to AS for results.  

## 2014-11-12 NOTE — Telephone Encounter (Signed)
Patient called inquiring about nuclear medicine test results  Please advise

## 2014-11-13 NOTE — Telephone Encounter (Signed)
See results

## 2014-11-13 NOTE — Progress Notes (Signed)
Quick Note:  Normal HIDA scan. Did patient have symptoms with CCK? ______

## 2014-11-14 NOTE — Telephone Encounter (Signed)
I gave pt her results.

## 2014-11-17 ENCOUNTER — Other Ambulatory Visit (HOSPITAL_COMMUNITY): Payer: Self-pay | Admitting: Pulmonary Disease

## 2014-11-17 DIAGNOSIS — Z1231 Encounter for screening mammogram for malignant neoplasm of breast: Secondary | ICD-10-CM

## 2014-11-18 NOTE — Progress Notes (Signed)
Quick Note:  We could refer to Dr. Arnoldo Morale for consideration of elective cholecystectomy, but this may not relieve her symptoms. I feel if she is tolerating a diet, no N/V, would hold off on this unless necessary. ______

## 2014-11-21 ENCOUNTER — Ambulatory Visit (HOSPITAL_COMMUNITY): Payer: Self-pay

## 2014-11-24 ENCOUNTER — Ambulatory Visit (HOSPITAL_COMMUNITY): Payer: Self-pay

## 2014-11-26 ENCOUNTER — Ambulatory Visit (INDEPENDENT_AMBULATORY_CARE_PROVIDER_SITE_OTHER): Payer: 59 | Admitting: Women's Health

## 2014-11-26 ENCOUNTER — Ambulatory Visit (HOSPITAL_COMMUNITY)
Admission: RE | Admit: 2014-11-26 | Discharge: 2014-11-26 | Disposition: A | Discharge status: Home / Self Care | Payer: 59 | Source: Ambulatory Visit | Attending: Pulmonary Disease | Admitting: Pulmonary Disease

## 2014-11-26 ENCOUNTER — Encounter: Payer: Self-pay | Admitting: Women's Health

## 2014-11-26 ENCOUNTER — Other Ambulatory Visit: Payer: Self-pay

## 2014-11-26 VITALS — BP 130/90 | Wt 163.4 lb

## 2014-11-26 DIAGNOSIS — N390 Urinary tract infection, site not specified: Secondary | ICD-10-CM

## 2014-11-26 DIAGNOSIS — R300 Dysuria: Secondary | ICD-10-CM

## 2014-11-26 DIAGNOSIS — R1032 Left lower quadrant pain: Secondary | ICD-10-CM

## 2014-11-26 DIAGNOSIS — Z1231 Encounter for screening mammogram for malignant neoplasm of breast: Secondary | ICD-10-CM | POA: Diagnosis not present

## 2014-11-26 LAB — POCT URINALYSIS DIPSTICK
GLUCOSE UA: NEGATIVE
Ketones, UA: NEGATIVE
Nitrite, UA: NEGATIVE
PROTEIN UA: NEGATIVE

## 2014-11-26 MED ORDER — CIPROFLOXACIN HCL 500 MG PO TABS: 500.0000 mg | ORAL_TABLET | Freq: Two times a day (BID) | ORAL | Status: DC

## 2014-11-26 NOTE — Progress Notes (Signed)
Patient ID: Nicole Gilbert, female   DOB: Sep 23, 1961, 53 y.o.   MRN: [id number]   [facility] Visit  Patient name: Nicole Gilbert MRN [id number]  Date of birth: 04-16-1961  CC & HPI:  Nicole Gilbert is a 53 y.o. African American female presenting today for report of urinary frequency, dysuria/cramping x ~5d, also gets shooting painful sensation in Lt arm as she urinates. No abnormal/malodorous d/c, itching/irritation. No fever/chills. Has been taking AZO, last dose yesterday.   Pertinent History Reviewed:  Medical & Surgical Hx:   Past Medical History  Diagnosis Date  . Anxiety   . Hemorrhoids 2006  . GERD (gastroesophageal reflux disease)   . Chronic constipation   . Hyperlipemia    Past Surgical History  Procedure Laterality Date  . Cesarean section      x2  . Hemorrhoid surgery  06/30/2008    Dr Arnoldo Morale  . Carpal tunnel right hand    . Knee arthroscopy      2010, RIGHT KNEE   . Esophagogastroduodenoscopy   01/20/2005    Rourk- Normal-appearing esophagus, status post passage of a 75 French Maloney  dilator/ small HH/  Otherwise normal stomach  . 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 Colonoscopy  08/22/2012    Rourk- hyperplastic polyps and tubular adenoma, query infectious colitis vs NSAID effect. Next TCS 08/2017.  . Abdominal hysterectomy    . Esophagogastroduodenoscopy N/A 02/06/2013    CBS:WHQPRF esophagus. Small hiatal hernia. Antral erosions: bx no h.pylori.  . Esophagogastroduodenoscopy N/A 06/25/2014    Dr. Myra Gianotti nodule-status post biopsy; otherwise, normalEGD. No endoscopic explanation for patient's symptoms  . Esophageal biopsy  06/25/2014    Dr. Geanie Logan features are not identified   Medications: Reviewed & Updated - see associated section Social History: Reviewed -  reports that she has never smoked.  She does not have any smokeless tobacco history on file.  Objective Findings:  Vitals: BP 130/90 mmHg  Wt 163 lb 6.4 oz (74.118 kg)  Physical Examination: General appearance - alert, well appearing, and in no distress  Results for orders placed or performed in visit on 11/26/14 (from the past 24 hour(s))  POCT Urinalysis Dipstick   Collection Time: 11/26/14 10:53 AM  Result Value Ref Range   Color, UA     Clarity, UA     Glucose, UA neg    Bilirubin, UA     Ketones, UA neg    Spec Grav, UA     Blood, UA moderate    pH, UA     Protein, UA neg    Urobilinogen, UA     Nitrite, UA neg    Leukocytes, UA small (1+)      Assessment & Plan:  A:   UTI P:  Rx cipro 500mg  BID x 3d for UTI  Send urine cx  Can restart AZO   F/U prn if worsening/not improving   Tawnya Crook CNM, [facility] 11/26/2014 11:07 AM

## 2014-11-26 NOTE — Patient Instructions (Signed)

## 2014-11-29 LAB — URINE CULTURE

## 2014-12-11 ENCOUNTER — Telehealth: Payer: Self-pay

## 2014-12-11 NOTE — Telephone Encounter (Signed)
Pt called to inform us that Dr. Arnoldo Morale told her that she did not need her gallbladder surgery done right now.

## 2014-12-12 NOTE — Telephone Encounter (Signed)
Noted. I had assumed she may not be a candidate for this currently. Thanks for update .

## 2015-03-30 ENCOUNTER — Emergency Department (HOSPITAL_COMMUNITY)
Admission: EM | Admit: 2015-03-30 | Discharge: 2015-03-30 | Disposition: A | Discharge status: Home / Self Care | Payer: 59 | Attending: Emergency Medicine | Admitting: Emergency Medicine

## 2015-03-30 ENCOUNTER — Encounter (HOSPITAL_COMMUNITY): Payer: Self-pay | Admitting: *Deleted

## 2015-03-30 ENCOUNTER — Emergency Department (HOSPITAL_COMMUNITY): Payer: 59

## 2015-03-30 DIAGNOSIS — R079 Chest pain, unspecified: Secondary | ICD-10-CM | POA: Diagnosis not present

## 2015-03-30 DIAGNOSIS — Z8719 Personal history of other diseases of the digestive system: Secondary | ICD-10-CM | POA: Diagnosis not present

## 2015-03-30 DIAGNOSIS — Z8639 Personal history of other endocrine, nutritional and metabolic disease: Secondary | ICD-10-CM | POA: Insufficient documentation

## 2015-03-30 DIAGNOSIS — R51 Headache: Secondary | ICD-10-CM | POA: Diagnosis not present

## 2015-03-30 DIAGNOSIS — R110 Nausea: Secondary | ICD-10-CM | POA: Insufficient documentation

## 2015-03-30 DIAGNOSIS — F419 Anxiety disorder, unspecified: Secondary | ICD-10-CM | POA: Diagnosis not present

## 2015-03-30 DIAGNOSIS — M542 Cervicalgia: Secondary | ICD-10-CM | POA: Diagnosis not present

## 2015-03-30 DIAGNOSIS — Z792 Long term (current) use of antibiotics: Secondary | ICD-10-CM | POA: Insufficient documentation

## 2015-03-30 LAB — I-STAT TROPONIN, ED
Troponin i, poc: 0 ng/mL (ref 0.00–0.08)
Troponin i, poc: 0 ng/mL (ref 0.00–0.08)

## 2015-03-30 LAB — BASIC METABOLIC PANEL
Anion gap: 9 (ref 5–15)
BUN: 10 mg/dL (ref 6–23)
CHLORIDE: 105 mmol/L (ref 96–112)
CO2: 26 mmol/L (ref 19–32)
Calcium: 9.4 mg/dL (ref 8.4–10.5)
Creatinine, Ser: 0.84 mg/dL (ref 0.50–1.10)
GFR calc Af Amer: 89 mL/min — ABNORMAL LOW (ref >90)
GFR, EST NON AFRICAN AMERICAN: 77 mL/min — AB (ref >90)
Glucose, Bld: 123 mg/dL — ABNORMAL HIGH (ref 70–99)
POTASSIUM: 3.7 mmol/L (ref 3.5–5.1)
Sodium: 140 mmol/L (ref 135–145)

## 2015-03-30 LAB — CBC
HEMATOCRIT: 39.9 % (ref 36.0–46.0)
HEMOGLOBIN: 13.2 g/dL (ref 12.0–15.0)
MCH: 25.9 pg — ABNORMAL LOW (ref 26.0–34.0)
MCHC: 33.1 g/dL (ref 30.0–36.0)
MCV: 78.4 fL (ref 78.0–100.0)
Platelets: 300 10*3/uL (ref 150–400)
RBC: 5.09 MIL/uL (ref 3.87–5.11)
RDW: 14.4 % (ref 11.5–15.5)
WBC: 4.1 10*3/uL (ref 4.0–10.5)

## 2015-03-30 LAB — D-DIMER, QUANTITATIVE (NOT AT ARMC)

## 2015-03-30 NOTE — ED Provider Notes (Signed)
CSN: [id number]     Arrival date & time 03/30/15  1251 History   First MD Initiated Contact with Patient 03/30/15 1653     Chief Complaint  Patient presents with  . Chest Pain     (Consider location/radiation/quality/duration/timing/severity/associated sxs/prior Treatment) HPI Nicole Gilbert is a 54 y.o. female with hx of anxiety, hyperlipemia, GERD, presents to ED with complaint of chest pain, headache, right sided neck pain. Pt states symptoms started 2 days ago with a headache and right sided neck pain. States pain is worsened with movement of the head. ttp in right neck. Stats yesterday started having left sided chest pain that radiates into the back. Pain is sharp. Comes and goes. Also radiates into left arm. She went to UC today, they gave her a muscle relaxant and steroids for neck pain but was told that she may need to go to ED for evaluation of her chest pain. Patient denies history of any cardiac problems. She states she used to have elevated cholesterol but she has not had it checked a few months. She did have a stress test but that was "multiple years ago." States that there is a cardiac disease history in her grandparents. She does not smoke. No history of hypertension or diabetes. No history of similar pain in the past.  Past Medical History  Diagnosis Date  . Anxiety   . Hemorrhoids 2006  . GERD (gastroesophageal reflux disease)   . Chronic constipation   . Hyperlipemia    Past Surgical History  Procedure Laterality Date  . Cesarean section      x2  . Hemorrhoid surgery  06/30/2008    Dr Arnoldo Morale  . Carpal tunnel right hand    . Knee arthroscopy      2010, RIGHT KNEE   . Esophagogastroduodenoscopy   01/20/2005    Rourk- Normal-appearing esophagus, status post passage of a 66 French Maloney  dilator/ small HH/  Otherwise normal stomach  . Colonoscopy   05/19/2008    Rourk-Prominent internal hemorrhoids and anal papilla, otherwise normal appearing rectal mucosa / Colonic  mucosa appeared normal except for melanosis coli  . Colonoscopy  08/22/2012    Rourk- hyperplastic polyps and tubular adenoma, query infectious colitis vs NSAID effect. Next TCS 08/2017.  . Abdominal hysterectomy    . Esophagogastroduodenoscopy N/A 02/06/2013    DGL:OVFIEP esophagus. Small hiatal hernia. Antral erosions: bx no h.pylori.  . Esophagogastroduodenoscopy N/A 06/25/2014    Dr. Myra Gianotti nodule-status post biopsy; otherwise, normalEGD. No endoscopic explanation for patient's symptoms  . Esophageal biopsy  06/25/2014    Dr. Geanie Logan features are not identified   Family History  Problem Relation Age of Onset  . Colon cancer Brother 74    (paternal half brother)  . Diabetes Father   . Coronary artery disease Maternal Grandmother    History  Substance Use Topics  . Smoking status: Never Smoker   . Smokeless tobacco: Not on file  . Alcohol Use: Yes     Comment: rare   OB History    No data available     Review of Systems  Constitutional: Negative for fever and chills.  Respiratory: Positive for chest tightness. Negative for cough and shortness of breath.   Cardiovascular: Positive for chest pain. Negative for palpitations and leg swelling.  Gastrointestinal: Positive for nausea. Negative for vomiting, abdominal pain and diarrhea.  Genitourinary: Negative for dysuria, flank pain and pelvic pain.  Musculoskeletal: Positive for neck pain. Negative for myalgias, arthralgias and neck stiffness.  Skin: Negative for rash.  Neurological: Positive for headaches. Negative for dizziness and weakness.  All other systems reviewed and are negative.     Allergies  Shellfish allergy  Home Medications   Prior to Admission medications   Medication Sig Start Date End Date Taking? Authorizing Provider  ciprofloxacin (CIPRO) 500 MG tablet Take 1 tablet (500 mg total) by mouth 2 (two) times daily. X 3 days 11/26/14   Roma Schanz, CNM  gabapentin (NEURONTIN) 100 MG  capsule Take 1 capsule (100 mg total) by mouth at bedtime. Patient not taking: Reported on 11/26/2014 10/09/14   Carole Civil, MD  ibuprofen (ADVIL,MOTRIN) 200 MG tablet Take 400 mg by mouth every 6 (six) hours as needed. For pain   Yes Historical Provider, MD   BP 154/85 mmHg  Pulse 71  Temp(Src) 98.2 F (36.8 C) (Oral)  Resp 18  Ht 5\' 1"  (1.549 m)  Wt 150 lb (68.04 kg)  BMI 28.36 kg/m2  SpO2 100% Physical Exam  Constitutional: She is oriented to person, place, and time. She appears well-developed and well-nourished. No distress.  HENT:  Head: Normocephalic.  Eyes: Conjunctivae are normal.  Neck: Neck supple.  Normal appearing neck. Tender to palpation of her right sternocleidomastoid. Full range of motion of the hand.  Cardiovascular: Normal rate, regular rhythm, normal heart sounds and intact distal pulses.   No murmur heard. Pulmonary/Chest: Effort normal and breath sounds normal. No respiratory distress. She has no wheezes. She has no rales. She exhibits tenderness.  Tender to palpation in the left anterior chest  Abdominal: Soft. Bowel sounds are normal. She exhibits no distension. There is no tenderness. There is no rebound.  Musculoskeletal: She exhibits no edema.  Neurological: She is alert and oriented to person, place, and time.  Skin: Skin is warm and dry.  Psychiatric: She has a normal mood and affect. Her behavior is normal.  Nursing note and vitals reviewed.   ED Course  Procedures (including critical care time) Labs Review Labs Reviewed  CBC - Abnormal; Notable for the following:    MCH 25.9 (*)    All other components within normal limits  BASIC METABOLIC PANEL - Abnormal; Notable for the following:    Glucose, Bld 123 (*)    GFR calc non Af Amer 77 (*)    GFR calc Af Amer 89 (*)    All other components within normal limits  D-DIMER, QUANTITATIVE  I-STAT TROPOININ, ED  Randolm Idol, ED    Imaging Review Dg Chest 2 View  03/30/2015    CLINICAL DATA:  Three-day history of left-sided chest pain  EXAM: CHEST  2 VIEW  COMPARISON:  November 05, 2013  FINDINGS: Lungs are clear. Heart size and pulmonary vascularity are normal. No adenopathy. No pneumothorax. No bone lesions.  IMPRESSION: No edema or consolidation.   Electronically Signed   By: Lowella Grip III M.D.   On: 03/30/2015 14:19     EKG Interpretation   Date/Time:  Monday March 30 2015 12:59:27 EDT Ventricular Rate:  93 PR Interval:  146 QRS Duration: 74 QT Interval:  342 QTC Calculation: 425 R Axis:   91 Text Interpretation:  Normal sinus rhythm Right atrial enlargement  Rightward axis Pulmonary disease pattern Abnormal ECG Since last tracing  only minimal R axis change and new RAH Confirmed by [facility]  MD, ELLIOTT  ([id number]) on 03/30/2015 5:10:13 PM      MDM   Final diagnoses:  Chest pain, unspecified chest pain type  patient is here with headache, right-sided neck pain, left-sided chest pain radiating into the back. I do not think her headache and neck pain related to her chest pain. Patient's chest x-ray is negative. Her lab work already resulted prior to me seeing her and is unremarkable including negative troponin. Her ECG is suspicious for lung disease pattern. Will add a d dimer.  Pt is comfortable appearing, BP normal, VS normal, doubt dissection   Filed Vitals:   03/30/15 1550  BP: 154/85  Pulse: 71  Temp:   Resp: 18   7:22 PM Delta trop and d dimer negative. Pain reproducible with palpation. She is low risk, HEART score of 2. Plan to d/c home with PCP follow up.   Filed Vitals:   03/30/15 1730 03/30/15 1745 03/30/15 1800 03/30/15 1815  BP: 136/84 128/80 127/81 123/78  Pulse: 68 64 73 80  Temp:      TempSrc:      Resp: 13 12 17 14   Height:      Weight:      SpO2: 99% 100% 100% 99%     Jeannett Senior, PA-C 03/30/15 1926  Daleen Bo, MD 04/01/15 [phone]

## 2015-03-30 NOTE — Discharge Instructions (Signed)
NSAIDs for pain. Fill prescriptions that were given to you by your PCP. Follow up in the office in 2-3 days if pain continues. Return if worsening symptoms.    Chest Pain (Nonspecific) It is often hard to give a specific diagnosis for the cause of chest pain. There is always a chance that your pain could be related to something serious, such as a heart attack or a blood clot in the lungs. You need to follow up with your health care provider for further evaluation. CAUSES   Heartburn.  Pneumonia or bronchitis.  Anxiety or stress.  Inflammation around your heart (pericarditis) or lung (pleuritis or pleurisy).  A blood clot in the lung.  A collapsed lung (pneumothorax). It can develop suddenly on its own (spontaneous pneumothorax) or from trauma to the chest.  Shingles infection (herpes zoster virus). The chest wall is composed of bones, muscles, and cartilage. Any of these can be the source of the pain.  The bones can be bruised by injury.  The muscles or cartilage can be strained by coughing or overwork.  The cartilage can be affected by inflammation and become sore (costochondritis). DIAGNOSIS  Lab tests or other studies may be needed to find the cause of your pain. Your health care provider may have you take a test called an ambulatory electrocardiogram (ECG). An ECG records your heartbeat patterns over a 24-hour period. You may also have other tests, such as:  Transthoracic echocardiogram (TTE). During echocardiography, sound waves are used to evaluate how blood flows through your heart.  Transesophageal echocardiogram (TEE).  Cardiac monitoring. This allows your health care provider to monitor your heart rate and rhythm in real time.  Holter monitor. This is a portable device that records your heartbeat and can help diagnose heart arrhythmias. It allows your health care provider to track your heart activity for several days, if needed.  Stress tests by exercise or by giving  medicine that makes the heart beat faster. TREATMENT   Treatment depends on what may be causing your chest pain. Treatment may include:  Acid blockers for heartburn.  Anti-inflammatory medicine.  Pain medicine for inflammatory conditions.  Antibiotics if an infection is present.  You may be advised to change lifestyle habits. This includes stopping smoking and avoiding alcohol, caffeine, and chocolate.  You may be advised to keep your head raised (elevated) when sleeping. This reduces the chance of acid going backward from your stomach into your esophagus. Most of the time, nonspecific chest pain will improve within 2-3 days with rest and mild pain medicine.  HOME CARE INSTRUCTIONS   If antibiotics were prescribed, take them as directed. Finish them even if you start to feel better.  For the next few days, avoid physical activities that bring on chest pain. Continue physical activities as directed.  Do not use any tobacco products, including cigarettes, chewing tobacco, or electronic cigarettes.  Avoid drinking alcohol.  Only take medicine as directed by your health care provider.  Follow your health care provider's suggestions for further testing if your chest pain does not go away.  Keep any follow-up appointments you made. If you do not go to an appointment, you could develop lasting (chronic) problems with pain. If there is any problem keeping an appointment, call to reschedule. SEEK MEDICAL CARE IF:   Your chest pain does not go away, even after treatment.  You have a rash with blisters on your chest.  You have a fever. SEEK IMMEDIATE MEDICAL CARE IF:   You  have increased chest pain or pain that spreads to your arm, neck, jaw, back, or abdomen.  You have shortness of breath.  You have an increasing cough, or you cough up blood.  You have severe back or abdominal pain.  You feel nauseous or vomit.  You have severe weakness.  You faint.  You have  chills. This is an emergency. Do not wait to see if the pain will go away. Get medical help at once. Call your local emergency services (911 in [place] Do not drive yourself to the hospital. MAKE SURE YOU:   Understand these instructions.  Will watch your condition.  Will get help right away if you are not doing well or get worse. Document Released: 08/31/2005 Document Revised: 11/26/2013 Document Reviewed: 06/26/2008 [facility] Patient Information 2015 [place], [place]. This information is not intended to replace advice given to you by your health care provider. Make sure you discuss any questions you have with your health care provider.

## 2015-03-30 NOTE — ED Notes (Signed)
Pt reports not feeling well since Friday. Onset last night of chest pain that radiates to around left side to her back. Having nausea. Denies sob.

## 2015-04-29 ENCOUNTER — Other Ambulatory Visit (HOSPITAL_COMMUNITY): Payer: Self-pay | Admitting: Pulmonary Disease

## 2015-04-29 DIAGNOSIS — R200 Anesthesia of skin: Secondary | ICD-10-CM

## 2015-04-30 ENCOUNTER — Ambulatory Visit (HOSPITAL_COMMUNITY)
Admission: RE | Admit: 2015-04-30 | Discharge: 2015-04-30 | Disposition: A | Discharge status: Home / Self Care | Payer: 59 | Source: Ambulatory Visit | Attending: Pulmonary Disease | Admitting: Pulmonary Disease

## 2015-04-30 ENCOUNTER — Other Ambulatory Visit (HOSPITAL_COMMUNITY): Payer: Self-pay | Admitting: Pulmonary Disease

## 2015-04-30 DIAGNOSIS — M542 Cervicalgia: Secondary | ICD-10-CM | POA: Diagnosis not present

## 2015-04-30 DIAGNOSIS — R51 Headache: Secondary | ICD-10-CM | POA: Diagnosis present

## 2015-04-30 DIAGNOSIS — R208 Other disturbances of skin sensation: Secondary | ICD-10-CM | POA: Diagnosis not present

## 2015-04-30 DIAGNOSIS — R200 Anesthesia of skin: Secondary | ICD-10-CM

## 2015-07-23 IMAGING — CR DG CHEST 2V
2 series · 2 of 2 positions shown · non-contrast
Comparison: 01/18/2013 and 11/17/2012

CLINICAL DATA: Chest pain and cough.  Sore throat.  Otalgia.

EXAM:
CHEST  2 VIEW

[w chest pa]
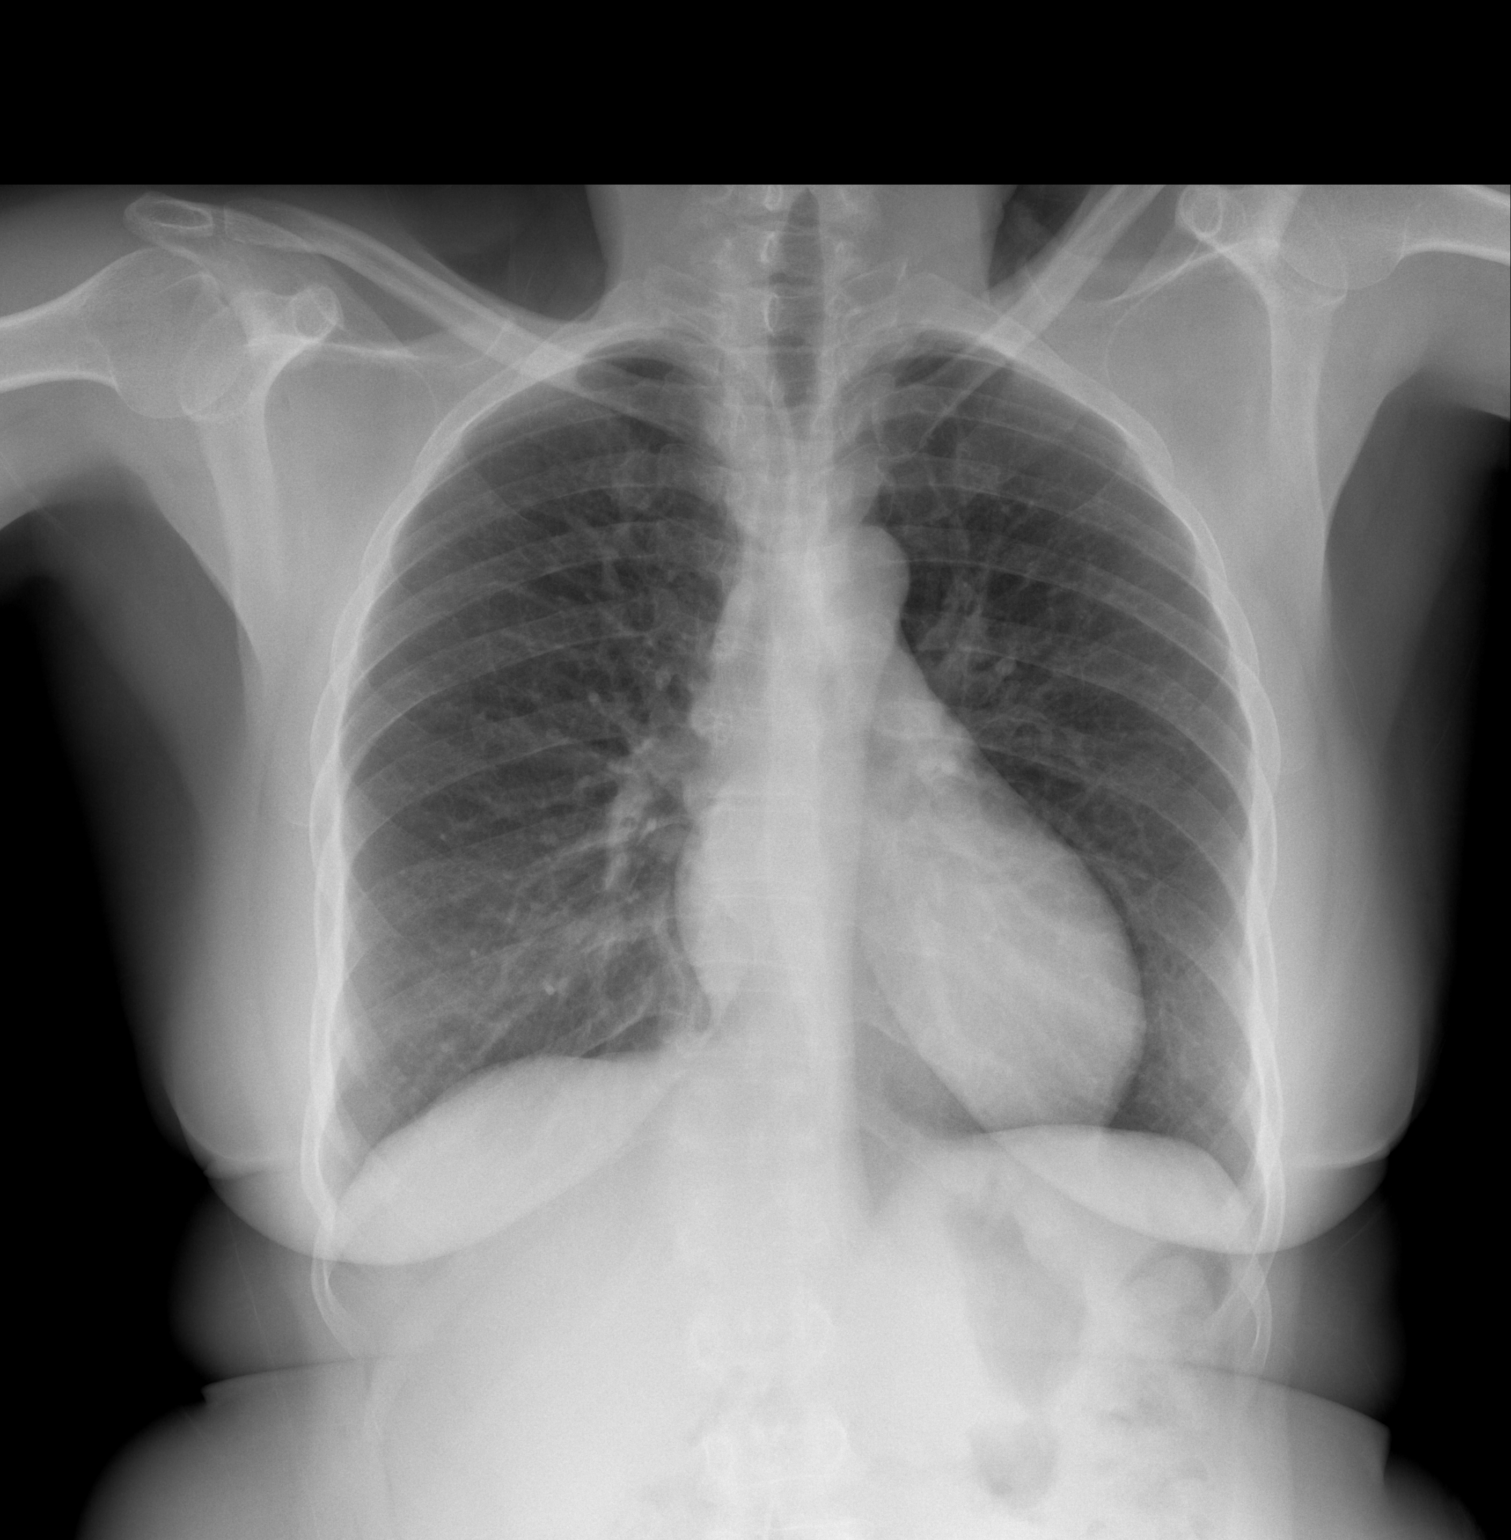

[w chest lat]
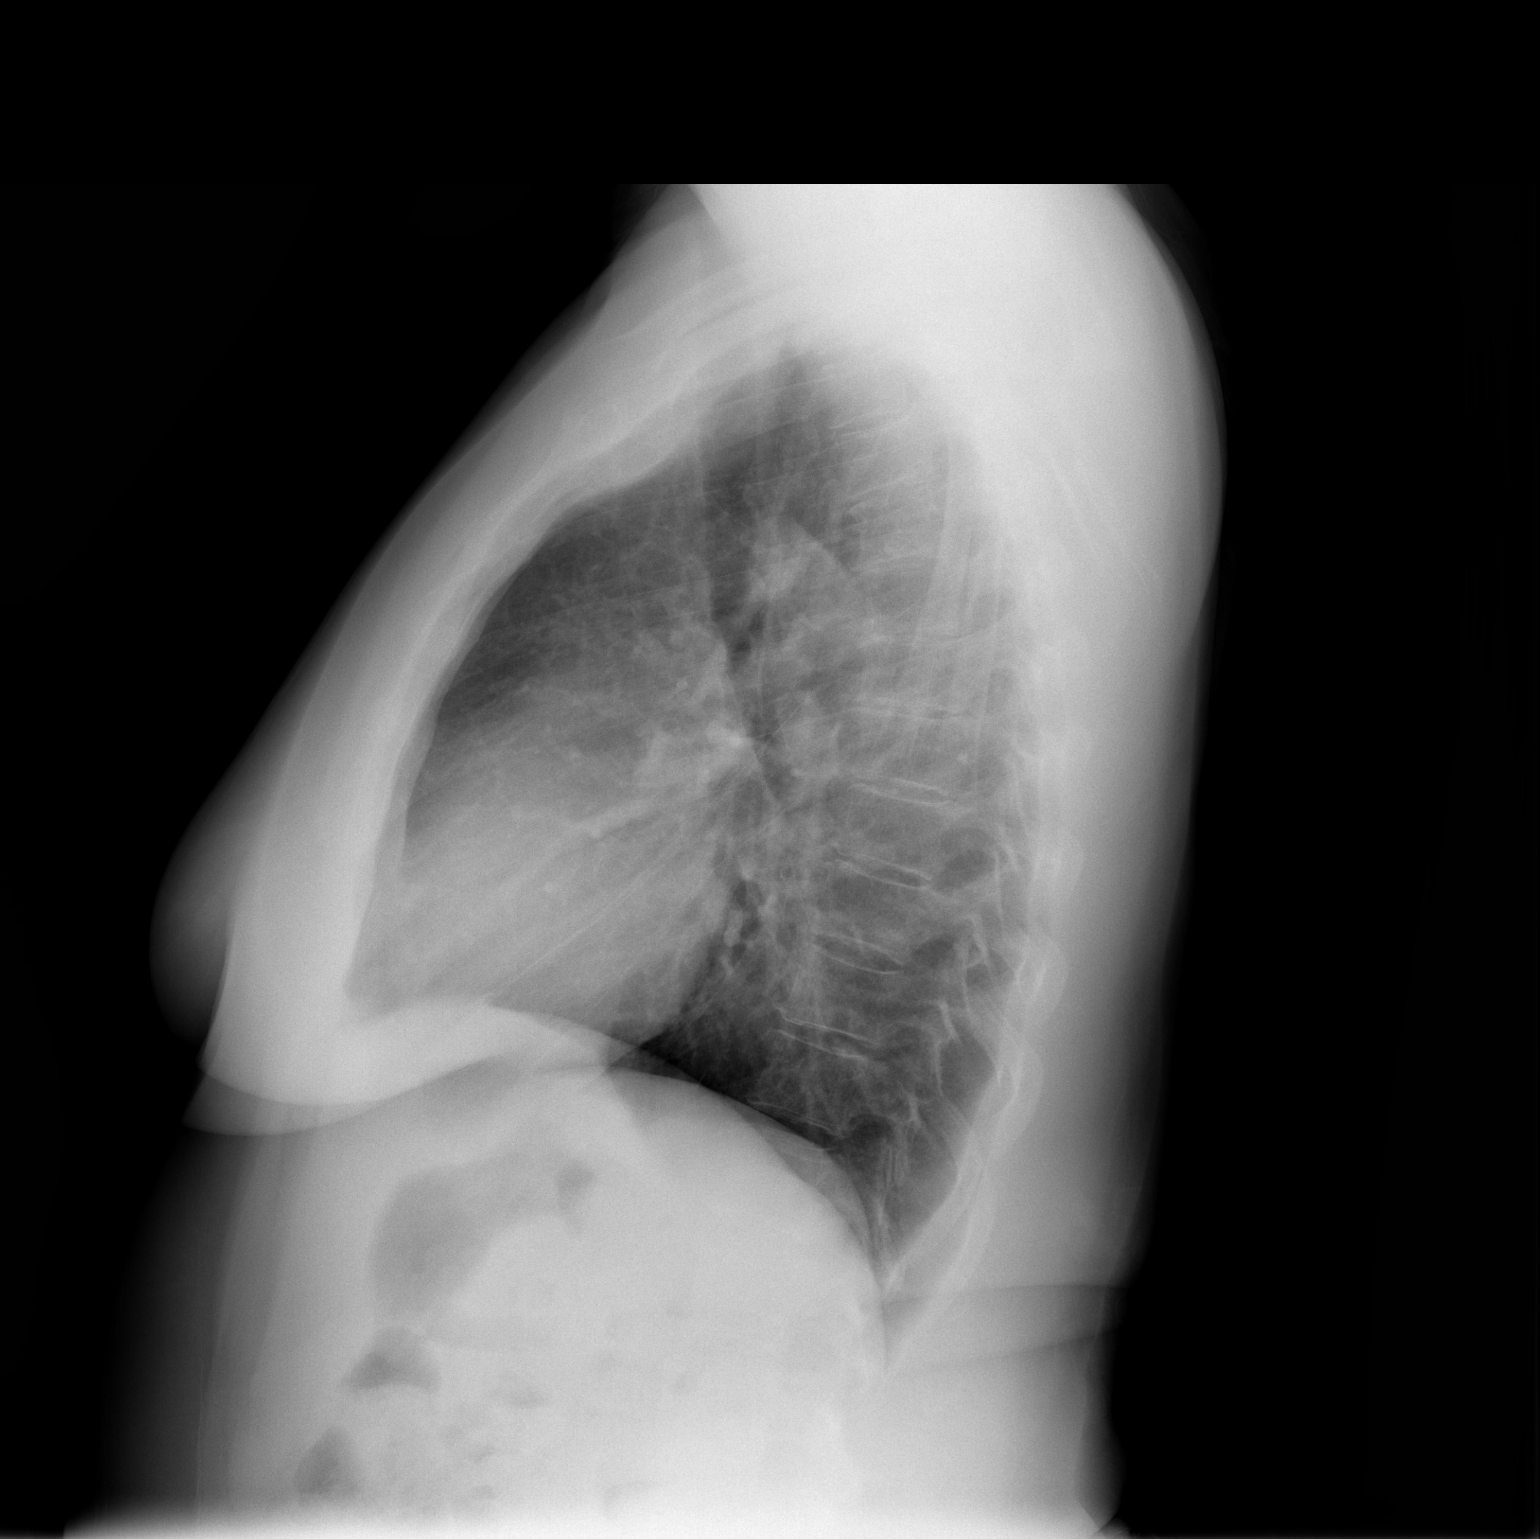

[2 of 2 positions shown; findings below may reference images not displayed]

FINDINGS: The heart size and mediastinal contours are within normal limits.
Both lungs are clear. The visualized skeletal structures are
unremarkable.
IMPRESSION: Normal exam.

## 2015-08-03 ENCOUNTER — Other Ambulatory Visit: Payer: Self-pay | Admitting: Neurology

## 2015-08-03 DIAGNOSIS — G8929 Other chronic pain: Secondary | ICD-10-CM

## 2015-08-03 DIAGNOSIS — R51 Headache: Principal | ICD-10-CM

## 2015-08-13 ENCOUNTER — Ambulatory Visit (HOSPITAL_COMMUNITY)
Admission: RE | Admit: 2015-08-13 | Discharge: 2015-08-13 | Disposition: A | Discharge status: Home / Self Care | Payer: 59 | Source: Ambulatory Visit | Attending: Neurology | Admitting: Neurology

## 2015-08-13 DIAGNOSIS — R51 Headache: Secondary | ICD-10-CM | POA: Insufficient documentation

## 2015-08-13 DIAGNOSIS — R200 Anesthesia of skin: Secondary | ICD-10-CM | POA: Insufficient documentation

## 2015-08-13 DIAGNOSIS — G8929 Other chronic pain: Secondary | ICD-10-CM

## 2015-09-16 ENCOUNTER — Ambulatory Visit: Payer: 59 | Admitting: Women's Health

## 2015-10-07 ENCOUNTER — Emergency Department (HOSPITAL_COMMUNITY)
Admission: EM | Admit: 2015-10-07 | Discharge: 2015-10-07 | Disposition: A | Discharge status: Home / Self Care | Payer: 59 | Attending: Emergency Medicine | Admitting: Emergency Medicine

## 2015-10-07 ENCOUNTER — Encounter (HOSPITAL_COMMUNITY): Payer: Self-pay | Admitting: Emergency Medicine

## 2015-10-07 DIAGNOSIS — R109 Unspecified abdominal pain: Secondary | ICD-10-CM | POA: Insufficient documentation

## 2015-10-07 DIAGNOSIS — M5431 Sciatica, right side: Secondary | ICD-10-CM | POA: Insufficient documentation

## 2015-10-07 DIAGNOSIS — Z8659 Personal history of other mental and behavioral disorders: Secondary | ICD-10-CM | POA: Diagnosis not present

## 2015-10-07 DIAGNOSIS — Z8719 Personal history of other diseases of the digestive system: Secondary | ICD-10-CM | POA: Diagnosis not present

## 2015-10-07 DIAGNOSIS — Z8639 Personal history of other endocrine, nutritional and metabolic disease: Secondary | ICD-10-CM | POA: Insufficient documentation

## 2015-10-07 DIAGNOSIS — K5900 Constipation, unspecified: Secondary | ICD-10-CM | POA: Diagnosis not present

## 2015-10-07 DIAGNOSIS — M545 Low back pain: Secondary | ICD-10-CM | POA: Diagnosis present

## 2015-10-07 LAB — URINALYSIS, ROUTINE W REFLEX MICROSCOPIC
BILIRUBIN URINE: NEGATIVE
Glucose, UA: NEGATIVE mg/dL
Hgb urine dipstick: NEGATIVE
KETONES UR: NEGATIVE mg/dL
Leukocytes, UA: NEGATIVE
NITRITE: NEGATIVE
PROTEIN: NEGATIVE mg/dL
SPECIFIC GRAVITY, URINE: 1.013 (ref 1.005–1.030)
Urobilinogen, UA: 0.2 mg/dL (ref 0.0–1.0)
pH: 5.5 (ref 5.0–8.0)

## 2015-10-07 MED ORDER — METHOCARBAMOL 500 MG PO TABS: 500.0000 mg | ORAL_TABLET | Freq: Two times a day (BID) | ORAL | Status: DC

## 2015-10-07 MED ORDER — KETOROLAC TROMETHAMINE 60 MG/2ML IM SOLN
60.0000 mg | Freq: Once | INTRAMUSCULAR | Status: AC
  Administered 2015-10-07: 60 mg via INTRAMUSCULAR
  Filled 2015-10-07: qty 2

## 2015-10-07 MED ORDER — NAPROXEN 500 MG PO TABS: 500.0000 mg | ORAL_TABLET | Freq: Two times a day (BID) | ORAL | Status: DC

## 2015-10-07 MED ORDER — PREDNISONE 10 MG (21) PO TBPK: 10.0000 mg | ORAL_TABLET | Freq: Every day | ORAL | Status: DC

## 2015-10-07 NOTE — Discharge Instructions (Signed)
Return to the ED with any concerns including weakness of leg, not able to urinate, loss of control of bowel or bladder, fever/chills, decreased level of alertness/lethargy, or any other alarming symptoms

## 2015-10-07 NOTE — ED Notes (Signed)
Patient reports right lower abdominal pain radiating to R flank area which began on Monday. Denies nausea/vomiting/diarrhea. Rates 6-7/10 on 09-10 pain scale. Pain increases with movement and deep breathing.

## 2015-10-07 NOTE — ED Provider Notes (Signed)
CSN: [id number]     Arrival date & time 10/07/15  0436 History   First MD Initiated Contact with Patient 10/07/15 [phone]     Chief Complaint  Patient presents with  . Flank Pain     (Consider location/radiation/quality/duration/timing/severity/associated sxs/prior Treatment) HPI  Pt presenting with right sided low back/flank pain.  She states pain began 2 days ago, pain is sharp and is worse with movement and palpation.  She has tried ibuprofen and alleve without much relief.  Pain does radiate down the right leg/thigh.  No fever/chills.  No dysuria/urinary urgency or frequency.  No hematuria.  No abdominal pain.  No weakness of legs, no incontinence of urine or stool, no urinary retention.  There are no other associated systemic symptoms, there are no other alleviating or modifying factors.   Past Medical History  Diagnosis Date  . Anxiety   . Hemorrhoids 2006  . GERD (gastroesophageal reflux disease)   . Chronic constipation   . Hyperlipemia    Past Surgical History  Procedure Laterality Date  . Cesarean section      x2  . Hemorrhoid surgery  06/30/2008    Dr Arnoldo Morale  . Carpal tunnel right hand    . Knee arthroscopy      2010, RIGHT KNEE   . Esophagogastroduodenoscopy   01/20/2005    Rourk- Normal-appearing esophagus, status post passage of a 50 French Maloney  dilator/ small HH/  Otherwise normal stomach  . 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 Colonoscopy  08/22/2012    Rourk- hyperplastic polyps and tubular adenoma, query infectious colitis vs NSAID effect. Next TCS 08/2017.  . Abdominal hysterectomy    . Esophagogastroduodenoscopy N/A 02/06/2013    [id number] esophagus. Small hiatal hernia. Antral erosions: bx no h.pylori.  . Esophagogastroduodenoscopy N/A 06/25/2014    Dr. Myra Gianotti nodule-status post biopsy; otherwise,  normalEGD. No endoscopic explanation for patient's symptoms  . Esophageal biopsy  06/25/2014    Dr. Geanie Logan features are not identified   Family History  Problem Relation Age of Onset  . Colon cancer Brother 59    (paternal half brother)  . Diabetes Father   . Coronary artery disease Maternal Grandmother    Social History  Substance Use Topics  . Smoking status: Never Smoker   . Smokeless tobacco: None  . Alcohol Use: Yes     Comment: rare   OB History    No data available     Review of Systems  ROS reviewed and all otherwise negative except for mentioned in HPI    Allergies  Shellfish allergy  Home Medications   Prior to Admission medications   Medication Sig Start Date End Date Taking? Authorizing Provider  ibuprofen (ADVIL,MOTRIN) 200 MG tablet Take 400 mg by mouth every 6 (six) hours as needed. For pain   Yes Historical Provider, MD  polyethylene glycol (MIRALAX / GLYCOLAX) packet Take 17 g by mouth daily as needed for mild constipation.   Yes Historical Provider, MD  methocarbamol (ROBAXIN) 500 MG tablet Take 1 tablet (500 mg total) by mouth 2 (two) times daily. 10/07/15   Alfonzo Beers, MD  naproxen (NAPROSYN) 500 MG tablet Take 1 tablet (500 mg total) by mouth 2 (two) times daily. 10/07/15   Alfonzo Beers, MD  predniSONE (STERAPRED UNI-PAK 21 TAB) 10 MG (21) TBPK tablet Take 1 tablet (10 mg total) by mouth  daily. Take 6 tabs by mouth daily  for 2 days, then 5 tabs for 2 days, then 4 tabs for 2 days, then 3 tabs for 2 days, 2 tabs for 2 days, then 1 tab by mouth daily for 2 days 10/07/15   Alfonzo Beers, MD   BP 119/86 mmHg  Pulse 73  Temp(Src) 97.8 F (36.6 C) (Oral)  Resp 16  SpO2 99%  Vitals reviewed Physical Exam  Physical Examination: General appearance - alert, well appearing, and in no distress Mental status - alert, oriented to person, place, and time Eyes - no conjunctival injection, no scleral icterus Mouth - mucous membranes moist, pharynx  normal without lesions Chest - clear to auscultation, no wheezes, rales or rhonchi, symmetric air entry Heart - normal rate, regular rhythm, normal S1, S2, no murmurs, rubs, clicks or gallops Abdomen - soft, nontender, nondistended, no masses or organomegaly Back exam - no midline tenderness to palpation of c/t/l spine, ttp over right lower lumbar paraspinous region, no CVA tenderness Neurological - alert, oriented, normal speech, strength 5/5 in extremities x 4, sensation intact Extremities - peripheral pulses normal, no pedal edema, no clubbing or cyanosis Skin - normal coloration and turgor, no rashes  ED Course  Procedures (including critical care time) Labs Review Labs Reviewed  URINALYSIS, ROUTINE W REFLEX MICROSCOPIC (NOT AT [facility])    Imaging Review No results found. I have personally reviewed and evaluated these images and lab results as part of my medical decision-making.   EKG Interpretation None      MDM   Final diagnoses:  Sciatica of right side    Pt presenting with c/o right sided low back pain with radiation to right lower extremity c/w sciatica.  No signs or symptoms of cauda equina.  No fever to suggest epidural abscess.  Pt is driving so discussed with her we are not able to give sedating medications,  Urine is negative, so doubt renal stone or pyelonephritis.  Pt given rx for muscle relaxer, antiinflammatories and steroids.  Discharged with strict return precautions.  Pt agreeable with plan.    Alfonzo Beers, MD 10/07/15 [phone]

## 2015-10-07 NOTE — ED Notes (Signed)
Dr. Linker at bedside at this time.  

## 2015-10-20 ENCOUNTER — Other Ambulatory Visit (HOSPITAL_COMMUNITY): Payer: Self-pay | Admitting: Pulmonary Disease

## 2015-10-20 DIAGNOSIS — Z1231 Encounter for screening mammogram for malignant neoplasm of breast: Secondary | ICD-10-CM

## 2015-11-12 ENCOUNTER — Ambulatory Visit (INDEPENDENT_AMBULATORY_CARE_PROVIDER_SITE_OTHER): Payer: 59

## 2015-11-12 ENCOUNTER — Ambulatory Visit (INDEPENDENT_AMBULATORY_CARE_PROVIDER_SITE_OTHER): Payer: 59 | Admitting: Orthopedic Surgery

## 2015-11-12 VITALS — BP 123/80 | Ht 61.0 in | Wt 160.0 lb

## 2015-11-12 DIAGNOSIS — S83241D Other tear of medial meniscus, current injury, right knee, subsequent encounter: Secondary | ICD-10-CM | POA: Diagnosis not present

## 2015-11-12 DIAGNOSIS — M25561 Pain in right knee: Secondary | ICD-10-CM

## 2015-11-12 MED ORDER — GABAPENTIN 100 MG PO CAPS: 100.0000 mg | ORAL_CAPSULE | Freq: Every day | ORAL | Status: DC

## 2015-11-12 NOTE — Progress Notes (Signed)
Established patient new problem  The patient is coming in today for chief complaint of right knee pain swelling  The patient has had osteoarthritis had arthroscopy several years ago has had intermittent swelling treated with ibuprofen and Aleve as needed she also has some lumbar spine radicular symptoms which were treated with gabapentin and treated successfully  She has no new injury but does complain of right knee pain swelling aching moderately severe of several days duration at this time and is aggravated by weightbearing causing giving way symptoms  Review of systems neurologically denies numbness or tingling but does have some radicular pain in the right leg starting in his breathing down to the foot. No weakness in the right leg.  Past Medical History  Diagnosis Date  . Anxiety   . Hemorrhoids 2006  . GERD (gastroesophageal reflux disease)   . Chronic constipation   . Hyperlipemia    Past Surgical History  Procedure Laterality Date  . Cesarean section      x2  . Hemorrhoid surgery  06/30/2008    Dr Arnoldo Morale  . Carpal tunnel right hand    . Knee arthroscopy      2010, RIGHT KNEE   . Esophagogastroduodenoscopy   01/20/2005    Rourk- Normal-appearing esophagus, status post passage of a 55 French Maloney  dilator/ small HH/  Otherwise normal stomach  . 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 Colonoscopy  08/22/2012    Rourk- hyperplastic polyps and tubular adenoma, query infectious colitis vs NSAID effect. Next TCS 08/2017.  . Abdominal hysterectomy    . Esophagogastroduodenoscopy N/A 02/06/2013    [id number] esophagus. Small hiatal hernia. Antral erosions: bx no h.pylori.  . Esophagogastroduodenoscopy N/A 06/25/2014    Dr. Myra Gianotti nodule-status post biopsy; otherwise, normalEGD. No endoscopic explanation for patient's symptoms  .  Esophageal biopsy  06/25/2014    Dr. Geanie Logan features are not identified    BP 123/80 mmHg  Ht 5\' 1"  (1.549 m)  Wt 160 lb (72.576 kg)  BMI 30.25 kg/m2  Gen. appearance is normal body habitus normal Oriented 3 Mood pleasant Gait normal Skin normal in the right leg Sensation intact Vascularity of the limb normal Knee exam shows that she is tender at the medial joint line flexion is 120 she has some pain with extension. McMurray sign was positive cruciate ligaments were stable motor exam showed normal strength in the quadriceps  Opposite knee exam showed mild joint line tenderness no swelling or effusion full range of motion and normal stability  X-rays today do show arthritis but no change from the x-ray from 2013  Recommend MRI to evaluate for recurrent meniscal tear  Called with results

## 2015-11-12 NOTE — Patient Instructions (Addendum)
We will schedule MRI for you and call you with appt, and call you with appt

## 2015-11-27 ENCOUNTER — Ambulatory Visit (HOSPITAL_COMMUNITY)
Admission: RE | Admit: 2015-11-27 | Discharge: 2015-11-27 | Disposition: A | Discharge status: Home / Self Care | Payer: 59 | Source: Ambulatory Visit | Attending: Orthopedic Surgery | Admitting: Orthopedic Surgery

## 2015-11-27 DIAGNOSIS — S83241D Other tear of medial meniscus, current injury, right knee, subsequent encounter: Secondary | ICD-10-CM | POA: Diagnosis present

## 2015-11-27 DIAGNOSIS — X58XXXD Exposure to other specified factors, subsequent encounter: Secondary | ICD-10-CM | POA: Insufficient documentation

## 2015-11-27 DIAGNOSIS — R609 Edema, unspecified: Secondary | ICD-10-CM | POA: Insufficient documentation

## 2015-12-02 ENCOUNTER — Ambulatory Visit (HOSPITAL_COMMUNITY)
Admission: RE | Admit: 2015-12-02 | Discharge: 2015-12-02 | Disposition: A | Discharge status: Home / Self Care | Payer: 59 | Source: Ambulatory Visit | Attending: Pulmonary Disease | Admitting: Pulmonary Disease

## 2015-12-02 ENCOUNTER — Telehealth: Payer: Self-pay | Admitting: *Deleted

## 2015-12-02 DIAGNOSIS — Z1231 Encounter for screening mammogram for malignant neoplasm of breast: Secondary | ICD-10-CM | POA: Insufficient documentation

## 2015-12-02 NOTE — Telephone Encounter (Signed)
Carole Civil, MD  Christia Reading, LPN           MRI showed severe arthritis no new tears   Options are continue with treatment for arthritis with exercise, Tylenol, arthritis medications, bracing, or     Knee replacement surgery      Patient aware and agreeable, cancelled appt for tomorrow, will call as needed for appt

## 2015-12-03 ENCOUNTER — Ambulatory Visit: Payer: Self-pay | Admitting: Orthopedic Surgery

## 2016-03-03 ENCOUNTER — Ambulatory Visit: Payer: Self-pay | Admitting: Nurse Practitioner

## 2016-11-16 ENCOUNTER — Emergency Department (HOSPITAL_COMMUNITY)
Admission: EM | Admit: 2016-11-16 | Discharge: 2016-11-16 | Disposition: A | Discharge status: Home / Self Care | Payer: Self-pay | Attending: Emergency Medicine | Admitting: Emergency Medicine

## 2016-11-16 ENCOUNTER — Emergency Department (HOSPITAL_COMMUNITY): Payer: Self-pay

## 2016-11-16 ENCOUNTER — Encounter (HOSPITAL_COMMUNITY): Payer: Self-pay | Admitting: Emergency Medicine

## 2016-11-16 DIAGNOSIS — R1084 Generalized abdominal pain: Secondary | ICD-10-CM | POA: Insufficient documentation

## 2016-11-16 DIAGNOSIS — Z79899 Other long term (current) drug therapy: Secondary | ICD-10-CM | POA: Insufficient documentation

## 2016-11-16 LAB — DIFFERENTIAL
BASOS ABS: 0 10*3/uL (ref 0.0–0.1)
BASOS PCT: 0 %
Eosinophils Absolute: 0 10*3/uL (ref 0.0–0.7)
Eosinophils Relative: 0 %
LYMPHS PCT: 18 %
Lymphs Abs: 1.3 10*3/uL (ref 0.7–4.0)
Monocytes Absolute: 0.2 10*3/uL (ref 0.1–1.0)
Monocytes Relative: 3 %
NEUTROS ABS: 5.8 10*3/uL (ref 1.7–7.7)
Neutrophils Relative %: 79 %

## 2016-11-16 LAB — CBC
HCT: 41.5 % (ref 36.0–46.0)
Hemoglobin: 13.8 g/dL (ref 12.0–15.0)
MCH: 25.8 pg — ABNORMAL LOW (ref 26.0–34.0)
MCHC: 33.3 g/dL (ref 30.0–36.0)
MCV: 77.7 fL — AB (ref 78.0–100.0)
PLATELETS: 344 10*3/uL (ref 150–400)
RBC: 5.34 MIL/uL — AB (ref 3.87–5.11)
RDW: 14.6 % (ref 11.5–15.5)
WBC: 7.3 10*3/uL (ref 4.0–10.5)

## 2016-11-16 LAB — URINALYSIS, ROUTINE W REFLEX MICROSCOPIC
Bilirubin Urine: NEGATIVE
Glucose, UA: NEGATIVE mg/dL
Hgb urine dipstick: NEGATIVE
KETONES UR: NEGATIVE mg/dL
LEUKOCYTES UA: NEGATIVE
NITRITE: NEGATIVE
PROTEIN: NEGATIVE mg/dL
Specific Gravity, Urine: 1.024 (ref 1.005–1.030)
pH: 5 (ref 5.0–8.0)

## 2016-11-16 LAB — COMPREHENSIVE METABOLIC PANEL
ALT: 27 U/L (ref 14–54)
AST: 29 U/L (ref 15–41)
Albumin: 4.1 g/dL (ref 3.5–5.0)
Alkaline Phosphatase: 63 U/L (ref 38–126)
Anion gap: 11 (ref 5–15)
BILIRUBIN TOTAL: 0.8 mg/dL (ref 0.3–1.2)
BUN: 15 mg/dL (ref 6–20)
CHLORIDE: 107 mmol/L (ref 101–111)
CO2: 21 mmol/L — ABNORMAL LOW (ref 22–32)
Calcium: 9.6 mg/dL (ref 8.9–10.3)
Creatinine, Ser: 0.74 mg/dL (ref 0.44–1.00)
Glucose, Bld: 154 mg/dL — ABNORMAL HIGH (ref 65–99)
Potassium: 4 mmol/L (ref 3.5–5.1)
Sodium: 139 mmol/L (ref 135–145)
TOTAL PROTEIN: 8.4 g/dL — AB (ref 6.5–8.1)

## 2016-11-16 LAB — LIPASE, BLOOD: LIPASE: 30 U/L (ref 11–51)

## 2016-11-16 MED ORDER — MORPHINE SULFATE (PF) 4 MG/ML IV SOLN
4.0000 mg | Freq: Once | INTRAVENOUS | Status: AC
  Administered 2016-11-16: 1 mg via INTRAVENOUS
  Filled 2016-11-16: qty 1

## 2016-11-16 MED ORDER — SODIUM CHLORIDE 0.9 % IV BOLUS (SEPSIS)
1000.0000 mL | Freq: Once | INTRAVENOUS | Status: AC
  Administered 2016-11-16: 1000 mL via INTRAVENOUS

## 2016-11-16 MED ORDER — ONDANSETRON HCL 4 MG/2ML IJ SOLN
4.0000 mg | Freq: Once | INTRAMUSCULAR | Status: AC
  Administered 2016-11-16: 4 mg via INTRAVENOUS

## 2016-11-16 MED ORDER — ONDANSETRON HCL 4 MG PO TABS: 4.0000 mg | ORAL_TABLET | Freq: Four times a day (QID) | ORAL | 0 refills | Status: DC

## 2016-11-16 MED ORDER — DOCUSATE SODIUM 100 MG PO CAPS: 100.0000 mg | ORAL_CAPSULE | Freq: Two times a day (BID) | ORAL | 0 refills | Status: DC

## 2016-11-16 MED ORDER — ONDANSETRON HCL 4 MG/2ML IJ SOLN
INTRAMUSCULAR | Status: AC
  Administered 2016-11-16: 4 mg via INTRAVENOUS
  Filled 2016-11-16: qty 2

## 2016-11-16 MED ORDER — MAGNESIUM CITRATE PO SOLN: 1.0000 | Freq: Once | ORAL | 0 refills | Status: AC

## 2016-11-16 NOTE — Discharge Instructions (Signed)
Please read and follow all provided instructions.  Your diagnoses today include:  1. Generalized abdominal pain    Tests performed today include: Blood counts and electrolytes Blood tests to check liver and kidney function Blood tests to check pancreas function Urine test to look for infection and pregnancy (in women) Vital signs. See below for your results today.   Medications prescribed:   Take any prescribed medications only as directed.  Home care instructions:  Follow any educational materials contained in this packet.  Follow-up instructions: Please follow-up with your primary care provider in the next 2 days for further evaluation of your symptoms.    Return instructions:  SEEK IMMEDIATE MEDICAL ATTENTION IF: The pain does not go away or becomes severe  A temperature above 101F develops  Repeated vomiting occurs (multiple episodes)  The pain becomes localized to portions of the abdomen. The right side could possibly be appendicitis. In an adult, the left lower portion of the abdomen could be colitis or diverticulitis.  Blood is being passed in stools or vomit (bright red or black tarry stools)  You develop chest pain, difficulty breathing, dizziness or fainting, or become confused, poorly responsive, or inconsolable (young children) If you have any other emergent concerns regarding your health  Additional Information: Abdominal (belly) pain can be caused by many things. Your caregiver performed an examination and possibly ordered blood/urine tests and imaging (CT scan, x-rays, ultrasound). Many cases can be observed and treated at home after initial evaluation in the emergency department. Even though you are being discharged home, abdominal pain can be unpredictable. Therefore, you need a repeated exam if your pain does not resolve, returns, or worsens. Most patients with abdominal pain don't have to be admitted to the hospital or have surgery, but serious problems like  appendicitis and gallbladder attacks can start out as nonspecific pain. Many abdominal conditions cannot be diagnosed in one visit, so follow-up evaluations are very important.  Your vital signs today were: BP (!) 146/102    Pulse 99    Temp 98.3 F (36.8 C) (Oral)    Resp 18    Ht 5\' 1"  (1.549 m)    Wt 76.2 kg    SpO2 96%    BMI 31.74 kg/m  If your blood pressure (bp) was elevated above 135/85 this visit, please have this repeated by your doctor within one month. --------------

## 2016-11-16 NOTE — ED Triage Notes (Addendum)
Pt arrives from home with c/o abdominal pain described as cramping starting at 0430 yesterday morning. States sometimes pain is sharp. Generalized. States she feels like she needs to have a bowel movement. LBM yesterday, states "I had to force it." No nausea, pt did attempt to induce vomiting to feel better, did not work. Pepto bismal, miralax not effective.

## 2016-11-16 NOTE — ED Notes (Signed)
Pt ambulated to restroom to obtain urine sample.

## 2016-11-16 NOTE — ED Provider Notes (Signed)
[facility] DEPT Provider Note   CSN: [id number] Arrival date & time: 11/16/16  [id number]  History   Chief Complaint Chief Complaint  Patient presents with  . Abdominal Pain    HPI Nicole Gilbert is a 55 y.o. female.  HPI  55 y.o. female with a hx of GERD, HLD, presents to the Emergency Department today complaining of abdominal pain. Pt states that this occurred around 0430 yesterday. Notes pain as cramping with occasional sharp sensation. No focal pain, notes being generalized. Pt concerned with bowel movements as her last one was yesterday and she described this movement as strained and "had to force it." No N/V. Notes pain 10/10. Attempted Pepto Bismol with minimal relief as well as Miralax. Pt states she is passing occasional flatus. No CP/SOB. No fevers. Notes traveling from [place] recently and coming back on Monday. Pt does endorse eating seafood that she did not refrigerate en route from [place] and ate in last night. Pt believes this could be the result of her pain. No hematochezia. No melena. No other symptoms noted.    Past Medical History:  Diagnosis Date  . Anxiety   . Chronic constipation   . GERD (gastroesophageal reflux disease)   . Hemorrhoids 2006  . Hyperlipemia     Patient Active Problem List   Diagnosis Date Noted  . Dyspepsia 10/29/2014  . Primary osteoarthritis of right knee 10/09/2014  . Abdominal pain, unspecified site 08/21/2013  . Dizziness 07/14/2013  . Abdominal pain, left lower quadrant 01/21/2013  . OA (osteoarthritis) of knee 10/04/2012  . Synovitis of knee 10/04/2012  . Family history of colon cancer 08/03/2012  . 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 BUCKET HANDLE TEAR OF LATERAL MENISCUS 02/09/2009  . SCIATICA 01/29/2009  . BACK PAIN 01/29/2009  . KNEE, ARTHRITIS, DEGEN./OSTEO 01/15/2009  . JOINT EFFUSION, RIGHT KNEE 01/15/2009  . KNEE PAIN 01/15/2009  . ANXIETY 08/13/2008  . NEUROSIS 08/13/2008  . HEMORRHOIDS 08/13/2008  . GERD  08/13/2008  . CONSTIPATION, CHRONIC 08/13/2008  . WEIGHT LOSS 08/13/2008  . NAUSEA 08/13/2008  . DYSPHAGIA UNSPECIFIED 08/13/2008  . HEMATOCHEZIA, HX OF 08/13/2008    Past Surgical History:  Procedure Laterality Date  . ABDOMINAL HYSTERECTOMY    . BIOPSY  06/25/2014   Dr. Rourk:malignant features are not identified  . carpal tunnel right hand    . CESAREAN SECTION     x2  . COLONOSCOPY   05/19/2008   Rourk-Prominent internal hemorrhoids and anal papilla, otherwise normal appearing rectal mucosa / Colonic mucosa appeared normal except for melanosis coli  . COLONOSCOPY  08/22/2012   Rourk- hyperplastic polyps and tubular adenoma, query infectious colitis vs NSAID effect. Next TCS 08/2017.  Marland Kitchen ESOPHAGOGASTRODUODENOSCOPY   01/20/2005   Rourk- Normal-appearing esophagus, status post passage of a 10 French Maloney  dilator/ small HH/  Otherwise normal stomach  . ESOPHAGOGASTRODUODENOSCOPY N/A 02/06/2013   [id number] esophagus. Small hiatal hernia. Antral erosions: bx no h.pylori.  . ESOPHAGOGASTRODUODENOSCOPY N/A 06/25/2014   Dr. Myra Gianotti nodule-status post biopsy; otherwise, normalEGD. No endoscopic explanation for patient's symptoms  . HEMORRHOID SURGERY  06/30/2008   Dr Arnoldo Morale  . KNEE ARTHROSCOPY     2010, RIGHT KNEE     OB History    No data available       Home Medications    Prior to Admission medications   Medication Sig Start Date End Date Taking? Authorizing Provider  gabapentin (NEURONTIN) 100 MG capsule Take 1 capsule (100 mg total) by mouth at bedtime.  11/12/15   Carole Civil, MD  ibuprofen (ADVIL,MOTRIN) 200 MG tablet Take 400 mg by mouth every 6 (six) hours as needed. For pain    Historical Provider, MD  methocarbamol (ROBAXIN) 500 MG tablet Take 1 tablet (500 mg total) by mouth 2 (two) times daily. 10/07/15   Alfonzo Beers, MD  naproxen (NAPROSYN) 500 MG tablet Take 1 tablet (500 mg total) by mouth 2 (two) times daily. 10/07/15   Alfonzo Beers, MD    polyethylene glycol (MIRALAX / GLYCOLAX) packet Take 17 g by mouth daily as needed for mild constipation.    Historical Provider, MD  predniSONE (STERAPRED UNI-PAK 21 TAB) 10 MG (21) TBPK tablet Take 1 tablet (10 mg total) by mouth daily. Take 6 tabs by mouth daily  for 2 days, then 5 tabs for 2 days, then 4 tabs for 2 days, then 3 tabs for 2 days, 2 tabs for 2 days, then 1 tab by mouth daily for 2 days 10/07/15   Alfonzo Beers, MD    Family History Family History  Problem Relation Age of Onset  . Colon cancer Brother 64    (paternal half brother)  . Diabetes Father   . Coronary artery disease Maternal Grandmother     Social History Social History  Substance Use Topics  . Smoking status: Never Smoker  . Smokeless tobacco: Not on file  . Alcohol use Yes     Comment: rare     Allergies   Shellfish allergy   Review of Systems Review of Systems ROS reviewed and all are negative for acute change except as noted in the HPI.  Physical Exam Updated Vital Signs There were no vitals taken for this visit.  Physical Exam  Constitutional: She is oriented to person, place, and time. Vital signs are normal. She appears well-developed and well-nourished.  HENT:  Head: Normocephalic.  Right Ear: Hearing normal.  Left Ear: Hearing normal.  Eyes: Conjunctivae and EOM are normal. Pupils are equal, round, and reactive to light.  Neck: Normal range of motion. Neck supple.  Cardiovascular: Normal rate, regular rhythm, normal heart sounds and intact distal pulses.   Pulmonary/Chest: Effort normal.  Abdominal: Soft. Normal appearance and bowel sounds are normal. There is generalized tenderness. There is no rigidity, no rebound, no guarding, no CVA tenderness, no tenderness at McBurney's point and negative Murphy's sign.  Abdomen soft  Genitourinary:  Genitourinary Comments: Chaperone was present. Patient with no pain around the rectal area. There are no external fissures noted. No induration  of the skin or swelling. No external hemorrhoids seen. Patient able to tolerate examination. I was able to feel the first 2-3cm of the rectum digitally without gross abnormality. There is no gross blood.  NO signs of perirectal abscess.  Musculoskeletal: Normal range of motion.  Neurological: She is alert and oriented to person, place, and time.  Skin: Skin is warm and dry.  Psychiatric: She has a normal mood and affect. Her speech is normal and behavior is normal. Thought content normal.  Nursing note and vitals reviewed.  ED Treatments / Results  Labs (all labs ordered are listed, but only abnormal results are displayed) Labs Reviewed - No data to display  EKG  EKG Interpretation None       Radiology No results found.  Procedures Procedures (including critical care time)  Medications Ordered in ED Medications - No data to display   Initial Impression / Assessment and Plan / ED Course  I have reviewed the triage  vital signs and the nursing notes.  Pertinent labs & imaging results that were available during my care of the patient were reviewed by me and considered in my medical decision making (see chart for details).  Clinical Course    Final Clinical Impressions(s) / ED Diagnoses  {I have reviewed and evaluated the relevant laboratory values. {I have reviewed and evaluated the relevant imaging studies.  {I have reviewed the relevant previous healthcare records.  {I obtained HPI from historian. {Patient discussed with supervising physician.  ED Course:  Assessment: Patient is a 55yF presents with abdominal pain this morning. Notes decrease in BM last night with moderate passage of stool. Also endorses eating unrefrigerated seafood from [place] last night once returning on Monday. No V/D. On exam, nontoxic, nonseptic appearing, in no apparent distress. Patient's pain and other symptoms adequately managed in emergency department.  Fluid bolus given.  Labs, imaging and  vitals reviewed.  Labs unremarkable. KUB without SBO. Patient does not meet the SIRS or Sepsis criteria.  On repeat exam patient does not have a surgical abdomen and there are no peritoneal signs.  No indication of appendicitis, bowel obstruction, bowel perforation, cholecystitis, diverticulitis. Given Rx Zofran, Docusate, Mag Citrate to cover possible gastritis vs constipation. Rectal exam without fecal obstruction. Patient discharged home with symptomatic treatment and given strict instructions for follow-up with their primary care physician.  I have also discussed reasons to return immediately to the ER.  Patient expresses understanding and agrees with plan.  Disposition/Plan:  DC Home Additional Verbal discharge instructions given and discussed with patient.  Pt Instructed to f/u with PCP in the next week for evaluation and treatment of symptoms. Return precautions given Pt acknowledges and agrees with plan  Supervising Physician Jola Schmidt, MD  Final diagnoses:  Generalized abdominal pain    New Prescriptions New Prescriptions   No medications on file       Shary Decamp, PA-C 11/16/16 [place], MD 11/16/16 [phone]

## 2016-11-22 ENCOUNTER — Encounter: Payer: Self-pay | Admitting: Gastroenterology

## 2016-11-22 ENCOUNTER — Ambulatory Visit (INDEPENDENT_AMBULATORY_CARE_PROVIDER_SITE_OTHER): Payer: PRIVATE HEALTH INSURANCE | Admitting: Gastroenterology

## 2016-11-22 VITALS — BP 128/81 | HR 90 | Temp 97.9°F | Ht 61.0 in | Wt 167.0 lb

## 2016-11-22 DIAGNOSIS — R1013 Epigastric pain: Secondary | ICD-10-CM

## 2016-11-22 DIAGNOSIS — R1031 Right lower quadrant pain: Secondary | ICD-10-CM

## 2016-11-22 DIAGNOSIS — K5909 Other constipation: Secondary | ICD-10-CM | POA: Diagnosis not present

## 2016-11-22 DIAGNOSIS — K219 Gastro-esophageal reflux disease without esophagitis: Secondary | ICD-10-CM

## 2016-11-22 DIAGNOSIS — R718 Other abnormality of red blood cells: Secondary | ICD-10-CM | POA: Insufficient documentation

## 2016-11-22 MED ORDER — LINACLOTIDE 72 MCG PO CAPS: 72.0000 ug | ORAL_CAPSULE | Freq: Every day | ORAL | 3 refills | Status: DC

## 2016-11-22 MED ORDER — OMEPRAZOLE 20 MG PO CPDR: 20.0000 mg | DELAYED_RELEASE_CAPSULE | Freq: Every day | ORAL | 2 refills | Status: DC

## 2016-11-22 NOTE — Progress Notes (Signed)
Primary Care Physician:  [name], MD  Primary Gastroenterologist:  Garfield Cornea, MD   Chief Complaint  Patient presents with  . Abdominal Pain    mid upper and RLQ, went to [facility] ER last week  . Nausea  . Emesis    vomited after taking Mag Citrate  . Bloated  . Constipation    feels urge    HPI:  Nicole Gilbert is a 55 y.o. female here For follow-up of recent ER visit. She was last seen in our office in November 2015. She has a history of constipation, dyspepsia.  Last colonoscopy September 2013, hyperplastic polyps and tubular adenoma, query infectious colitis versus NSAID effect. Next colonoscopy September 2018. Last EGD July 2015, esophageal nodule benign, normal EGD otherwise. Previous gallbladder workup including abdominal ultrasound and HIDA scan in 2015.  History pulmonary nodules, patient never had her chest CT in 2015 is recommended  Patient states that recently traveled to [place]. Believes she got food poisoning. The last day she was there she had fried fish. She states she is allergic to shellfish and was advised that the fish was fried in the same oil with shellfish. Usually she gets hives. She carried the fish home with her on her flight, it was not refrigerated for approximately 8 hours. She put in the refrigerator when she got home, considering the fish the following day. Within a few hours she developed severe abdominal pain which persisted for 13 hours and associated with vomiting, large semi-loose BM. She went to the ER for evaluation. Abdominal film during ER visit on 11/16/2016 unremarkable. Labs as outlined below. MCV slightly low but normal hemoglobin.  Severity of her abdominal pain has improved but persist. She has epigastric pain, right lower quadrant pain. She continues to feel bloated. Stool is [place] 6 but she feels incomplete. Continues to take MiraLAX daily. ER requested that she take magnesium citrate because they thought she was  constipated. She vomited that back up. Appetite has been slow to return. Denies heartburn or dysphagia. No melena rectal bleeding. Pain continues to be worse with standing and eating.   Current Outpatient Prescriptions  Medication Sig Dispense Refill  . ibuprofen (ADVIL,MOTRIN) 200 MG tablet Take 400 mg by mouth every 6 (six) hours as needed. For pain    . polyethylene glycol (MIRALAX / GLYCOLAX) packet Take 17 g by mouth daily as needed for mild constipation.     No current facility-administered medications for this visit.     Allergies as of 11/22/2016 - Review Complete 11/22/2016  Allergen Reaction Noted  . Morphine and related Nausea And Vomiting 11/16/2016  . Shellfish allergy Hives and Itching 08/13/2012    Past Medical History:  Diagnosis Date  . Anxiety   . Chronic constipation   . GERD (gastroesophageal reflux disease)   . Hemorrhoids 2006  . Hyperlipemia     Past Surgical History:  Procedure Laterality Date  . ABDOMINAL HYSTERECTOMY    . BIOPSY  06/25/2014   Dr. Rourk:malignant features are not identified  . carpal tunnel right hand    . CESAREAN SECTION     x2  . COLONOSCOPY   05/19/2008   Rourk-Prominent internal hemorrhoids and anal papilla, otherwise normal appearing rectal mucosa / Colonic mucosa appeared normal except for melanosis coli  . COLONOSCOPY  08/22/2012   Rourk- hyperplastic polyps and tubular adenoma, query infectious colitis vs NSAID effect. Next TCS 08/2017.  Marland Kitchen ESOPHAGOGASTRODUODENOSCOPY   01/20/2005   Rourk- Normal-appearing esophagus, status post  passage of a 39 French Maloney  dilator/ small HH/  Otherwise normal stomach  . ESOPHAGOGASTRODUODENOSCOPY N/A 02/06/2013   [id number] esophagus. Small hiatal hernia. Antral erosions: bx no h.pylori.  . ESOPHAGOGASTRODUODENOSCOPY N/A 06/25/2014   Dr. Myra Gianotti nodule-status post biopsy; otherwise, normalEGD. No endoscopic explanation for patient's symptoms  . HEMORRHOID SURGERY  06/30/2008   Dr  Arnoldo Morale  . KNEE ARTHROSCOPY     2010, RIGHT KNEE     Family History  Problem Relation Age of Onset  . Diabetes Father   . Colon cancer Brother 80    (paternal half brother)  . Coronary artery disease Maternal Grandmother     Social History   Social History  . Marital status: Single    Spouse name: N/A  . Number of children: 2  . Years of education: N/A   Occupational History  . nursing assistant, [employer]   .  [place]   Social History Main Topics  . Smoking status: Never Smoker  . Smokeless tobacco: Never Used  . Alcohol use Yes     Comment: rare  . Drug use: No  . Sexual activity: Yes    Birth control/ protection: None   Other Topics Concern  . Not on file   Social History Narrative  . No narrative on file      ROS:  General: Negative for anorexia, weight loss, fever, chills, fatigue, weakness. Eyes: Negative for vision changes.  ENT: Negative for hoarseness, difficulty swallowing , nasal congestion. CV: Negative for chest pain, angina, palpitations, dyspnea on exertion, peripheral edema.  Respiratory: Negative for dyspnea at rest, dyspnea on exertion, cough, sputum, wheezing.  GI: See history of present illness. GU:  Negative for dysuria, hematuria, urinary incontinence, urinary frequency, nocturnal urination.  MS: Negative for joint pain, low back pain.  Derm: Negative for rash or itching.  Neuro: Negative for weakness, abnormal sensation, seizure, frequent headaches, memory loss, confusion.  Psych: Negative for anxiety, depression, suicidal ideation, hallucinations.  Endo: Negative for unusual weight change.  Heme: Negative for bruising or bleeding. Allergy: Negative for rash or hives.    Physical Examination:  BP 128/81   Pulse 90   Temp 97.9 F (36.6 C) (Oral)   Ht 5\' 1"  (1.549 m)   Wt 167 lb (75.8 kg)   BMI 31.55 kg/m    General: Well-nourished, well-developed in no acute distress.  Head: Normocephalic, atraumatic.   Eyes:  Conjunctiva pink, no icterus. Mouth: Oropharyngeal mucosa moist and pink , no lesions erythema or exudate. Neck: Supple without thyromegaly, masses, or lymphadenopathy.  Lungs: Clear to auscultation bilaterally.  Heart: Regular rate and rhythm, no murmurs rubs or gallops.  Abdomen: Bowel sounds are normal, mild epigastric/right lower quadrant tenderness, nondistended, no hepatosplenomegaly or masses, no abdominal bruits or    hernia , no rebound or guarding.   Rectal: Not performed Extremities: No lower extremity edema. No clubbing or deformities.  Neuro: Alert and oriented x 4 , grossly normal neurologically.  Skin: Warm and dry, no rash or jaundice.   Psych: Alert and cooperative, normal mood and affect.  Labs: Lab Results  Component Value Date   WBC 7.3 11/16/2016   HGB 13.8 11/16/2016   HCT 41.5 11/16/2016   MCV 77.7 (L) 11/16/2016   PLT 344 11/16/2016   Lab Results  Component Value Date   CREATININE 0.74 11/16/2016   BUN 15 11/16/2016   NA 139 11/16/2016   K 4.0 11/16/2016   CL 107 11/16/2016   CO2  21 (L) 11/16/2016   Lab Results  Component Value Date   ALT 27 11/16/2016   AST 29 11/16/2016   ALKPHOS 63 11/16/2016   BILITOT 0.8 11/16/2016   Lab Results  Component Value Date   LIPASE 30 11/16/2016     Imaging Studies: Dg Abdomen 1 View  Result Date: 11/16/2016 CLINICAL DATA:  Abdominal pain EXAM: ABDOMEN - 1 VIEW COMPARISON:  September 20, 2014 FINDINGS: There is no appreciable bowel dilatation or air-fluid level to suggest bowel obstruction. No free air. Fairly mild stool present in colon. There are phleboliths in the pelvis. No bone lesions evident. IMPRESSION: Bowel gas pattern unremarkable.  No bowel obstruction or free air. Electronically Signed   By: Lowella Grip III M.D.   On: 11/16/2016 07:12

## 2016-11-22 NOTE — Progress Notes (Signed)
cc'ed to pcp °

## 2016-11-22 NOTE — Patient Instructions (Signed)
1. Please start probiotic for the next 4-6 weeks. [place] can be found over-the-counter at a reasonable cost. 2. Stop MiraLAX for the meantime. Try Linzess 26mcg daily on empty stomach. Samples provided. RX sent to pharmacy.  3. Start omeprazole once daily 30 minutes before breakfast. Samples provided. RX sent to pharmacy. 4. You will need a chest CT to follow-up of pulmonary nodules in the near future. We will wait see how you're abdominal symptoms settle out, if persistent after above regimen, consider abdominal pelvic CT as well. 5. Please call in 7-10 days with a progress report or sooner if symptoms worsen.

## 2016-11-22 NOTE — Assessment & Plan Note (Signed)
55 year old female chronically deals with constipation, dyspepsia who presents for follow-up of recent acute symptoms consisting of epigastric/right lower quadrant pain, vomiting. Her evaluation is noted. Outside of microcytosis Other significant findings. 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 Abdominal pain worse with meals and some relief with BMs. Initially provide supportive measures with probiotic daily for 4-6 weeks, stop MiraLAX and transition to low dose Linzess 33mcg daily for feeling of incomplete stools. Add PPI therapy. If continues to remain symptomatic after 7-10 days of supportive measures, or worsening symptoms, would consider CT abdomen/pelvis.   Patient also has history of pulmonary nodules, recommend chest CT for follow-up. Family history of lung cancer, niece. We will wait until it is determined whether or not she will need further abdominal pelvic imaging before pursuing chest CT.

## 2016-12-14 NOTE — Progress Notes (Signed)
Please call for progress report. If patient still having problems, she may need ct a/p. Either way she will need chest ct for h/o pulmonary nodules. Let me know how she is doing.

## 2016-12-15 NOTE — Progress Notes (Signed)
Tried to call pt- NA- LMOM asking her to call me back or send me a message via mychart. I have also sent her a message via mychart.

## 2017-01-04 LAB — CBC WITH DIFFERENTIAL/PLATELET
BASOS PCT: 1 %
Basophils Absolute: 38 cells/uL (ref 0–200)
EOS ABS: 76 {cells}/uL (ref 15–500)
Eosinophils Relative: 2 %
HCT: 39.4 % (ref 35.0–45.0)
HEMOGLOBIN: 12.7 g/dL (ref 11.7–15.5)
LYMPHS ABS: 2546 {cells}/uL (ref 850–3900)
LYMPHS PCT: 67 %
MCH: 25.5 pg — ABNORMAL LOW (ref 27.0–33.0)
MCHC: 32.2 g/dL (ref 32.0–36.0)
MCV: 79 fL — AB (ref 80.0–100.0)
MONO ABS: 418 {cells}/uL (ref 200–950)
MPV: 9.2 fL (ref 7.5–12.5)
Monocytes Relative: 11 %
NEUTROS PCT: 19 %
Neutro Abs: 722 cells/uL — ABNORMAL LOW (ref 1500–7800)
Platelets: 310 10*3/uL (ref 140–400)
RBC: 4.99 MIL/uL (ref 3.80–5.10)
RDW: 14.9 % (ref 11.0–15.0)
WBC: 3.8 10*3/uL (ref 3.8–10.8)

## 2017-01-09 NOTE — Progress Notes (Signed)
Microcytosis improved. May be her baseline.   Would recommend CBC, iron/tibc, ferritin in two months.  Needs chest ct to follow up bilateral pulmonary nodules.

## 2017-01-12 ENCOUNTER — Other Ambulatory Visit: Payer: Self-pay | Admitting: Gastroenterology

## 2017-01-12 DIAGNOSIS — R718 Other abnormality of red blood cells: Secondary | ICD-10-CM

## 2017-01-12 NOTE — Progress Notes (Signed)
Letter mailed to the pt. 

## 2017-02-06 ENCOUNTER — Ambulatory Visit: Payer: Self-pay | Admitting: Cardiology

## 2017-02-06 ENCOUNTER — Encounter: Payer: Self-pay | Admitting: Cardiology

## 2017-02-06 NOTE — Progress Notes (Signed)
Cardiology Office Note  Date: 02/07/2017   ID: Nicole Gilbert, DOB 1961-08-09, MRN [id number]  PCP: [name], MD  Consulting Cardiologist: Rozann Lesches, MD   Chief Complaint  Patient presents with  . Chest Pain    History of Present Illness: Nicole Gilbert is a 56 y.o. female referred for cardiology consultation by Dr. Luan Pulling. She presents with a long-standing history of recurrent chest pain dating back over the last 3 years by her account. She reports both sharp and all feelings of discomfort in her chest, no obvious precipitant, no specific relation to activity. She wonders sometimes if it is related to stress. She has taken antiacids without any improvement. Previous cardiac evaluation in 2011 was reassuring as outlined below. I reviewed her recent ECG from January.  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 She does report some family history of heart disease although it does not look to be premature.  She states that she has not been taking any of her medications recently, the most recent list is outlined below.  She has a history of benign heart murmur. She has not undergone any follow-up ischemic testing since 2011.  Past Medical History:  Diagnosis Date  . Anxiety   . Chronic constipation   . GERD (gastroesophageal reflux disease)   . Hemorrhoids 2006  . Hyperlipemia   . Insomnia     Past Surgical History:  Procedure Laterality Date  . ABDOMINAL HYSTERECTOMY    . BIOPSY  06/25/2014   Dr. Rourk:malignant features are not identified  . Carpal tunnel right hand    . CESAREAN SECTION     x2  . COLONOSCOPY   05/19/2008   Rourk-Prominent internal hemorrhoids and anal papilla, otherwise normal appearing rectal mucosa / Colonic mucosa appeared normal except for melanosis coli  . COLONOSCOPY  08/22/2012   Rourk- hyperplastic polyps and tubular adenoma, query infectious colitis vs NSAID effect. Next TCS 08/2017.  Marland Kitchen ESOPHAGOGASTRODUODENOSCOPY    01/20/2005   Rourk- Normal-appearing esophagus, status post passage of a 82 French Maloney  dilator/ small HH/  Otherwise normal stomach  . ESOPHAGOGASTRODUODENOSCOPY N/A 02/06/2013   [id number] esophagus. Small hiatal hernia. Antral erosions: bx no h.pylori.  . ESOPHAGOGASTRODUODENOSCOPY N/A 06/25/2014   Dr. Myra Gianotti nodule-status post biopsy; otherwise, normalEGD. No endoscopic explanation for patient's symptoms  . HEMORRHOID SURGERY  06/30/2008   Dr Arnoldo Morale  . KNEE ARTHROSCOPY     2010, RIGHT KNEE     Current Outpatient Prescriptions  Medication Sig Dispense Refill  . ibuprofen (ADVIL,MOTRIN) 200 MG tablet Take 400 mg by mouth every 6 (six) hours as needed. For pain    . linaclotide (LINZESS) 72 MCG capsule Take 1 capsule (72 mcg total) by mouth daily before breakfast. (Patient not taking: Reported on 02/07/2017) 30 capsule 3  . omeprazole (PRILOSEC) 20 MG capsule Take 1 capsule (20 mg total) by mouth daily. (Patient not taking: Reported on 02/07/2017) 30 capsule 2  . polyethylene glycol (MIRALAX / GLYCOLAX) packet Take 17 g by mouth daily as needed for mild constipation.     No current facility-administered medications for this visit.    Allergies:  Morphine and related and Shellfish allergy   Social History: The patient  reports that she has never smoked. She has never used smokeless tobacco. She reports that she drinks alcohol. She reports that she does not use drugs.   Family History: The patient's family history includes Colon cancer (age of onset: 16) in her brother; Coronary  artery disease in her maternal grandmother; Diabetes in her father; Lung cancer in her other.   ROS:  Please see the history of present illness. Otherwise, complete review of systems is positive for none.  All other systems are reviewed and negative.   Physical Exam: VS:  BP 126/78   Pulse 97   Ht 5\' 1"  (1.549 m)   Wt 165 lb (74.8 kg)   SpO2 96%   BMI 31.18 kg/m , BMI Body mass index is 31.18  kg/m.  Wt Readings from Last 3 Encounters:  02/07/17 165 lb (74.8 kg)  11/22/16 167 lb (75.8 kg)  11/16/16 168 lb (76.2 kg)    General: Patient appears comfortable at rest. HEENT: Conjunctiva and lids normal, oropharynx clear. Neck: Supple, no elevated JVP or carotid bruits, no thyromegaly. Lungs: Clear to auscultation, nonlabored breathing at rest. Cardiac: Regular rate and rhythm, no S3, soft basal systolic murmur, no pericardial rub. Abdomen: Soft, nontender, bowel sounds present, no guarding or rebound. Extremities: No pitting edema, distal pulses 2+. Skin: Warm and dry. Musculoskeletal: No kyphosis. Neuropsychiatric: Alert and oriented x3, affect grossly appropriate.  ECG: I personally reviewed the tracing from 01/03/2017 which shows normal sinus rhythm.  Recent Labwork: 11/16/2016: ALT 27; AST 29; BUN 15; Creatinine, Ser 0.74; Potassium 4.0; Sodium 139 01/04/2017: Hemoglobin 12.7; Platelets 310   Other Studies Reviewed Today:  Abdominal films 11/16/2016: FINDINGS: There is no appreciable bowel dilatation or air-fluid level to suggest bowel obstruction. No free air. Fairly mild stool present in colon. There are phleboliths in the pelvis. No bone lesions evident.  IMPRESSION: Bowel gas pattern unremarkable.  No bowel obstruction or free air.  Exercise Myoview 03/09/2010: IMPRESSION: Normal left ventricular ejection fraction of 68%. Normal left ventricular wall motion. Subtle area of reversible decreased myocardial perfusion involving the anteroseptal wall left ventricle near the apex, cannot entirely exclude breast attenuation artifact as above.  Assessment and Plan:  1. Atypical recurrent chest pain. ECG from January was reassuring overall. She does have a history of hyperlipidemia, also reflux disease but has not improved with antireflux measures. Last ischemic assessment was in 2011. We will plan a follow-up evaluation with exercise echocardiogram.  2.  Hyperlipidemia, diet managed at this time.  3. History of reflux, she is not taking Linzess or Prilosec at this time.  4. History of anxiety, she wonders whether this may also be related to her symptoms.  Current medicines were reviewed with the patient today.   Orders Placed This Encounter  Procedures  . ECHOCARDIOGRAM STRESS TEST    Disposition: Call with test results.  Signed, Satira Sark, MD, [facility] 02/07/2017 8:53 AM    [place] [facility] [address] [place], [place] [address] Phone: [phone]; Fax: [phone]

## 2017-02-07 ENCOUNTER — Ambulatory Visit (INDEPENDENT_AMBULATORY_CARE_PROVIDER_SITE_OTHER): Payer: PRIVATE HEALTH INSURANCE | Admitting: Cardiology

## 2017-02-07 ENCOUNTER — Encounter: Payer: Self-pay | Admitting: Cardiology

## 2017-02-07 VITALS — BP 126/78 | HR 97 | Ht 61.0 in | Wt 165.0 lb

## 2017-02-07 DIAGNOSIS — R0789 Other chest pain: Secondary | ICD-10-CM

## 2017-02-07 DIAGNOSIS — K219 Gastro-esophageal reflux disease without esophagitis: Secondary | ICD-10-CM

## 2017-02-07 DIAGNOSIS — E782 Mixed hyperlipidemia: Secondary | ICD-10-CM

## 2017-02-07 DIAGNOSIS — Z8659 Personal history of other mental and behavioral disorders: Secondary | ICD-10-CM | POA: Diagnosis not present

## 2017-02-07 NOTE — Patient Instructions (Signed)
Your physician recommends that you schedule a follow-up appointment in:  To be determined after test.    Your physician has requested that you have a stress echocardiogram. For further information please visit [email or web address]. Please follow instruction sheet as given.       Thank you for choosing Portal !

## 2017-02-09 ENCOUNTER — Ambulatory Visit (HOSPITAL_COMMUNITY)
Admission: RE | Admit: 2017-02-09 | Discharge: 2017-02-09 | Disposition: A | Discharge status: Home / Self Care | Payer: PRIVATE HEALTH INSURANCE | Source: Ambulatory Visit | Attending: Cardiology | Admitting: Cardiology

## 2017-02-09 DIAGNOSIS — R0789 Other chest pain: Secondary | ICD-10-CM | POA: Diagnosis not present

## 2017-02-09 LAB — ECHOCARDIOGRAM STRESS TEST
CHL CUP MPHR: 163 {beats}/min
CHL CUP RESTING HR STRESS: 88 {beats}/min
CSEPPHR: 169 {beats}/min
Estimated workload: 10.1 METS
Exercise duration (min): 7 min
Exercise duration (sec): 39 s
Percent HR: 103 %
RPE: 12

## 2017-02-09 NOTE — Progress Notes (Signed)
*  PRELIMINARY RESULTS* Echocardiogram 2D Echocardiogram has been performed.  Nicole Gilbert 02/09/2017, 9:09 AM

## 2017-02-23 ENCOUNTER — Other Ambulatory Visit: Payer: Self-pay

## 2017-02-23 DIAGNOSIS — R718 Other abnormality of red blood cells: Secondary | ICD-10-CM

## 2017-03-15 LAB — CBC WITH DIFFERENTIAL/PLATELET
Basophils Absolute: 39 {cells}/uL (ref 0–200)
Basophils Relative: 1 %
Eosinophils Absolute: 78 {cells}/uL (ref 15–500)
Eosinophils Relative: 2 %
HCT: 38 % (ref 35.0–45.0)
Hemoglobin: 12.2 g/dL (ref 11.7–15.5)
Lymphocytes Relative: 54 %
Lymphs Abs: 2106 {cells}/uL (ref 850–3900)
MCH: 25.4 pg — ABNORMAL LOW (ref 27.0–33.0)
MCHC: 32.1 g/dL (ref 32.0–36.0)
MCV: 79 fL — ABNORMAL LOW (ref 80.0–100.0)
MPV: 9.2 fL (ref 7.5–12.5)
Monocytes Absolute: 273 {cells}/uL (ref 200–950)
Monocytes Relative: 7 %
Neutro Abs: 1404 {cells}/uL — ABNORMAL LOW (ref 1500–7800)
Neutrophils Relative %: 36 %
Platelets: 308 K/uL (ref 140–400)
RBC: 4.81 MIL/uL (ref 3.80–5.10)
RDW: 14.7 % (ref 11.0–15.0)
WBC: 3.9 K/uL (ref 3.8–10.8)

## 2017-03-15 LAB — IRON AND TIBC
%SAT: 10 % — ABNORMAL LOW (ref 11–50)
Iron: 37 ug/dL — ABNORMAL LOW (ref 45–160)
TIBC: 374 ug/dL (ref 250–450)
UIBC: 337 ug/dL (ref 125–400)

## 2017-03-15 LAB — FERRITIN: Ferritin: 56 ng/mL (ref 10–232)

## 2017-03-27 NOTE — Progress Notes (Signed)
hgb normal. MCV stable, iron little low.   Recommend ifobt.  She never had her chest CT (for bilateral pumonary nodules) done as recommended after last OV.  Recommend nonurgent ov to discuss possible colonoscopy.

## 2017-03-28 ENCOUNTER — Encounter: Payer: Self-pay | Admitting: Gastroenterology

## 2017-03-28 NOTE — Progress Notes (Signed)
APPT MADE AND LETTER SENT  °

## 2017-04-03 ENCOUNTER — Telehealth: Payer: Self-pay

## 2017-04-03 ENCOUNTER — Other Ambulatory Visit: Payer: Self-pay

## 2017-04-03 DIAGNOSIS — R918 Other nonspecific abnormal finding of lung field: Secondary | ICD-10-CM

## 2017-04-03 NOTE — Telephone Encounter (Signed)
Pt called to schedule her CT scan.

## 2017-04-03 NOTE — Telephone Encounter (Signed)
Called [facility] for PA for CT chest. No PA needed. Ref# [id number].

## 2017-04-03 NOTE — Telephone Encounter (Signed)
With contrast

## 2017-04-03 NOTE — Telephone Encounter (Signed)
Nicole Paganini, do you want CT chest with or without contrast for bilateral pulmonary nodules?

## 2017-04-03 NOTE — Telephone Encounter (Signed)
CT Chest with contrast scheduled for 04/17/17 at 3:00pm, pt to arrive at 2:45pm. Liquids only for 4 hours. Tried to call pt, no answer. LMOVM and informed her of CT scan. Letter also mailed.

## 2017-04-04 NOTE — Telephone Encounter (Signed)
Pt called office to reschedule CT. Gave her phone number to central scheduling so she reschedule.

## 2017-04-12 NOTE — Telephone Encounter (Signed)
Pt called- left voicemail- she said she is still having the same problems with her abd and wants to know if a ct/abd can be added on to the ct/chest?   Pt has ct scheduled for next Thursday and ov scheduled for Friday. Phone number is [phone]

## 2017-04-13 NOTE — Telephone Encounter (Signed)
I have not seen her in six months. At this time I cannot add on a CT A/P without seeing her. She has upcoming OV. We can discuss then.  It is her choice if she wants to go ahead with Chest CT as scheduled vs postponing until after OV.

## 2017-04-13 NOTE — Telephone Encounter (Signed)
I spoke with the pt, advised her that we needed to see her prior to doing the ct abd, and that she could postpone the ct chest and do both at the same time. Pt initially said she would just have the chest CT and come to her ov and then if she needed to be scheduled for ct abd she would go back and do that,  Informed her of the risks of having multiple CT scans and insurance issues with that. Pt said she would think about it and let us know prior to her ov next week. She said she would be her for her ov. At this moment she does not want to cancel her chest ct. She will let us know.

## 2017-04-17 ENCOUNTER — Ambulatory Visit (HOSPITAL_COMMUNITY): Payer: PRIVATE HEALTH INSURANCE

## 2017-04-19 ENCOUNTER — Ambulatory Visit: Payer: PRIVATE HEALTH INSURANCE | Admitting: Gastroenterology

## 2017-04-20 ENCOUNTER — Ambulatory Visit (HOSPITAL_COMMUNITY)
Admission: RE | Admit: 2017-04-20 | Discharge: 2017-04-20 | Disposition: A | Discharge status: Home / Self Care | Payer: PRIVATE HEALTH INSURANCE | Source: Ambulatory Visit | Attending: Gastroenterology | Admitting: Gastroenterology

## 2017-04-20 DIAGNOSIS — R918 Other nonspecific abnormal finding of lung field: Secondary | ICD-10-CM | POA: Insufficient documentation

## 2017-04-20 MED ORDER — IOPAMIDOL (ISOVUE-300) INJECTION 61%
80.0000 mL | Freq: Once | INTRAVENOUS | Status: AC | PRN
  Administered 2017-04-20: 75 mL via INTRAVENOUS

## 2017-04-20 NOTE — Progress Notes (Signed)
Referring Provider: Sinda Du, MD Primary Care Physician:  Sinda Du, MD Primary GI:  Dr. Gala Romney  Chief Complaint  Patient presents with  . Colonoscopy    HPI:   Nicole Gilbert is a 56 y.o. female who presents for evaluation for possible colonoscopy. The patient was last seen in our office 11/22/2016 for right lower quadrant pain, GERD, epigastric pain, and microcytosis. At that time it was noted that she has chronic constipation and dyspepsia. Last colonoscopy September 2013 with hyperplastic polyps and tubular adenoma query infectious colitis versus NSAID effect and recommended next colonoscopy September 2018. Last upper endoscopy July 2015 with esophageal nodule found to be benign otherwise normal EGD. Gallbladder workup including HIDA scan on file. History of pulmonary nodules, never had chest CT in 2015 which was recommended.  At the time of her last visit she believes she had food poisoning while traveling to [place] with subsequent vomiting, loose stools. Went to the ER for evaluation with abdominal films unremarkable. She has persistent abdominal pain at her last visit although was significantly improved. Continues with bloated feeling, stools consistent with [place] 6 but incomplete. Takes MiraLAX daily. Appetite slow to return.  Microcytosis improved, may be baseline recommended CBC, iron studies in 2 months as well as follow-up chest CT for pulmonary nodules. Requested progress report and is still having problems May need CT of the abdomen and pelvis. She was started on probiotics, stop MiraLAX and try Linzess for possible constipation on imaging, omeprazole once daily.  The patient followed up for months later to have CT of the chest done and requested CT of the abdomen to be added. However, it was decided that she would need to be seen because we have not seen her in approximately 6 months. She was okay with this.  Labs Completed 03/15/2017 show microcytosis near  baseline, normal hemoglobin, ferritin normal. Iron a little low at 37 with low sats at 10%. Recommended iFOBT and nonurgent office visit to discuss possible colonoscopy.  Today she states she feels pretty good overall. She does note some RLQ pain which is intermittent and sometimes migrates to the left side. Has chronic constipation. Tried Linzess which helped for a few days but then stopped working. She has been using Metamucil which is working well for her. Having a bowel movement about once a day; sometimes has straining with sensation of incomplete emptying. Drinks plenty of water. Is not on any other constipation medication. Morning nausea which improves after a bowel movement. Has some bloating and weight gain. Having recurrent GERD symptoms but she is not taking her PPI regularly. Was previously on Dexilant. Denies fever, chills, hematochezia, melena, unintentional weight loss. Denies chest pain, dyspnea, dizziness, lightheadedness, syncope, near syncope. Denies any other upper or lower GI symptoms.  Past Medical History:  Diagnosis Date  . Anxiety   . Chronic constipation   . GERD (gastroesophageal reflux disease)   . Hemorrhoids 2006  . Hyperlipemia   . Insomnia     Past Surgical History:  Procedure Laterality Date  . ABDOMINAL HYSTERECTOMY    . BIOPSY  06/25/2014   Dr. Rourk:malignant features are not identified  . Carpal tunnel right hand    . CESAREAN SECTION     x2  . COLONOSCOPY   05/19/2008   Rourk-Prominent internal hemorrhoids and anal papilla, otherwise normal appearing rectal mucosa / Colonic mucosa appeared normal except for melanosis coli  . COLONOSCOPY  08/22/2012   Rourk- hyperplastic polyps and tubular adenoma, query  infectious colitis vs NSAID effect. Next TCS 08/2017.  Marland Kitchen ESOPHAGOGASTRODUODENOSCOPY   01/20/2005   Rourk- Normal-appearing esophagus, status post passage of a 15 French Maloney  dilator/ small HH/  Otherwise normal stomach  . ESOPHAGOGASTRODUODENOSCOPY  N/A 02/06/2013   [id number] esophagus. Small hiatal hernia. Antral erosions: bx no h.pylori.  . ESOPHAGOGASTRODUODENOSCOPY N/A 06/25/2014   Dr. Myra Gianotti nodule-status post biopsy; otherwise, normalEGD. No endoscopic explanation for patient's symptoms  . HEMORRHOID SURGERY  06/30/2008   Dr Arnoldo Morale  . KNEE ARTHROSCOPY     2010, RIGHT KNEE     Current Outpatient Prescriptions  Medication Sig Dispense Refill  . omeprazole (PRILOSEC) 20 MG capsule Take 1 capsule (20 mg total) by mouth daily. 30 capsule 2  . psyllium (METAMUCIL) 58.6 % powder Take 1 packet by mouth daily.    Marland Kitchen ibuprofen (ADVIL,MOTRIN) 200 MG tablet Take 400 mg by mouth every 6 (six) hours as needed. For pain    . polyethylene glycol (MIRALAX / GLYCOLAX) packet Take 17 g by mouth daily as needed for mild constipation.     No current facility-administered medications for this visit.     Allergies as of 04/21/2017 - Review Complete 04/21/2017  Allergen Reaction Noted  . Morphine and related Nausea And Vomiting 11/16/2016  . Shellfish allergy Hives and Itching 08/13/2012    Family History  Problem Relation Age of Onset  . Diabetes Father   . Colon cancer Brother 86       (paternal half brother)  . Coronary artery disease Maternal Grandmother   . Lung cancer Other     Social History   Social History  . Marital status: Single    Spouse name: N/A  . Number of children: 2  . Years of education: N/A   Occupational History  . nursing assistant, [employer]   .  [place]   Social History Main Topics  . Smoking status: Never Smoker  . Smokeless tobacco: Never Used  . Alcohol use Yes     Comment: rare  . Drug use: No  . Sexual activity: Yes    Birth control/ protection: None   Other Topics Concern  . None   Social History Narrative  . None    Review of Systems: General: Negative for anorexia, weight loss, fever, chills, fatigue, weakness. ENT: Negative for hoarseness, difficulty  swallowing. CV: Negative for chest pain, angina, palpitations, peripheral edema.  Respiratory: Negative for dyspnea at rest, cough, sputum, wheezing.  GI: See history of present illness. Endo: Negative for unusual weight change.  Heme: Negative for bruising or bleeding.   Physical Exam: BP 131/83   Pulse 80   Temp 97.8 F (36.6 C) (Oral)   Ht 5\' 1"  (1.549 m)   Wt 166 lb 9.6 oz (75.6 kg)   BMI 31.48 kg/m  General:   Obese female. Alert and oriented. Pleasant and cooperative. Well-nourished and well-developed.  Eyes:  Without icterus, sclera clear and conjunctiva pink.  Ears:  Normal auditory acuity. Cardiovascular:  S1, S2 present without murmurs appreciated. Normal pulses noted. Extremities without clubbing or edema. Respiratory:  Clear to auscultation bilaterally. No wheezes, rales, or rhonchi. No distress.  Gastrointestinal:  +BS, rounded but soft, non-tender and non-distended. No HSM noted. No guarding or rebound. No masses appreciated.  Rectal:  Deferred  Musculoskalatal:  Symmetrical without gross deformities. Neurologic:  Alert and oriented x4;  grossly normal neurologically. Psych:  Alert and cooperative. Normal mood and affect. Heme/Lymph/Immune: No excessive bruising noted.  04/21/2017 11:07 AM   Disclaimer: This note was dictated with voice recognition software. Similar sounding words can inadvertently be transcribed and may not be corrected upon review.

## 2017-04-20 NOTE — Progress Notes (Signed)
Please let patient know that she has four lung nodules that are stable and considered benign. No need for further work up.

## 2017-04-21 ENCOUNTER — Ambulatory Visit (INDEPENDENT_AMBULATORY_CARE_PROVIDER_SITE_OTHER): Payer: PRIVATE HEALTH INSURANCE | Admitting: Nurse Practitioner

## 2017-04-21 ENCOUNTER — Encounter: Payer: Self-pay | Admitting: Nurse Practitioner

## 2017-04-21 ENCOUNTER — Other Ambulatory Visit: Payer: Self-pay

## 2017-04-21 ENCOUNTER — Ambulatory Visit (INDEPENDENT_AMBULATORY_CARE_PROVIDER_SITE_OTHER): Payer: PRIVATE HEALTH INSURANCE

## 2017-04-21 VITALS — BP 131/83 | HR 80 | Temp 97.8°F | Ht 61.0 in | Wt 166.6 lb

## 2017-04-21 DIAGNOSIS — K5909 Other constipation: Secondary | ICD-10-CM | POA: Diagnosis not present

## 2017-04-21 DIAGNOSIS — E611 Iron deficiency: Secondary | ICD-10-CM

## 2017-04-21 DIAGNOSIS — R1031 Right lower quadrant pain: Secondary | ICD-10-CM | POA: Diagnosis not present

## 2017-04-21 LAB — IFOBT (OCCULT BLOOD): IMMUNOLOGICAL FECAL OCCULT BLOOD TEST: NEGATIVE

## 2017-04-21 MED ORDER — PEG 3350-KCL-NA BICARB-NACL 420 G PO SOLR: 4000.0000 mL | ORAL | 0 refills | Status: DC

## 2017-04-21 NOTE — Assessment & Plan Note (Signed)
Iron and iron saturations are low, ferritin normal, abnormal RBC indices at baseline and no anemia. Possible early chronic bleeding. She has stopped taking her PPI and is having worsening GERD symptoms. She states that she is going to restart her PPI today. Abdominal pain not too bad, most likely due to constipation as further noted below. At this point she is due for colonoscopy in the next 3-4 months. Given her low iron we will proceed with colonoscopy with possible upper endoscopy depending on her symptomatic presentation from upper GI perspective the day of her procedure and findings on colonoscopy. Return for follow-up in 3 months.  Proceed with TCS +/- EGD with Dr. Gala Romney in near future: the risks, benefits, and alternatives have been discussed with the patient in detail. The patient states understanding and desires to proceed.  The patient is not on any anticoagulants, anxiolytics, chronic pain medications, or antidepressants. Rare alcohol use, denies drug use. Conscious sedation should be adequate for her procedure as it was for her last.

## 2017-04-21 NOTE — Progress Notes (Signed)
Pt return IFOBT test and it was negative 

## 2017-04-21 NOTE — Patient Instructions (Signed)
1. Continue taking Metamucil. 2. Restart your Prilosec acid blocker. Take this once a day on an empty stomach (preferably first thing in the morning.) 3. Start taking Colace over-the-counter stool softener 100 mg once a day or twice a day, depending on how you're responding to it. 4. We will schedule your procedure for you. 5. Return for follow-up in 3 months. 6. Call if any questions or concerns.

## 2017-04-21 NOTE — Assessment & Plan Note (Signed)
She did not like adverse effects of Linzess. She is taking Metamucil at this point which has caused significant improvement in her constipation although not resolution. I will have her add Colace 100 mg once a day to twice a day to try to further her progress. Return for follow-up in 3 months.

## 2017-04-24 ENCOUNTER — Other Ambulatory Visit: Payer: Self-pay

## 2017-04-24 ENCOUNTER — Telehealth: Payer: Self-pay

## 2017-04-24 DIAGNOSIS — Z86010 Personal history of colonic polyps: Secondary | ICD-10-CM

## 2017-04-24 DIAGNOSIS — K5900 Constipation, unspecified: Secondary | ICD-10-CM

## 2017-04-24 DIAGNOSIS — K219 Gastro-esophageal reflux disease without esophagitis: Secondary | ICD-10-CM

## 2017-04-24 DIAGNOSIS — E611 Iron deficiency: Secondary | ICD-10-CM

## 2017-04-24 NOTE — Progress Notes (Signed)
cc'ed to pcp °

## 2017-04-24 NOTE — Telephone Encounter (Signed)
Called [employer] to see if pt needs PA for upcoming TCS/+/-EGD. No PA is needed.

## 2017-04-27 NOTE — Progress Notes (Signed)
Patient seen same day in office by EG and TCS scheduled.  FYI EG.

## 2017-05-04 NOTE — Patient Instructions (Signed)
Your procedure is scheduled on:  05/15/2017  Report to [facility] at  1000  AM.  Call this number if you have problems the morning of surgery: [phone]   Do not eat food or drink liquids :After Midnight.      Take these medicines the morning of surgery with A SIP OF WATER: prilosec.   Do not wear jewelry, make-up or nail polish.  Do not wear lotions, powders, or perfumes. You may wear deodorant.  Do not shave 48 hours prior to surgery.  Do not bring valuables to the hospital.  Contacts, dentures or bridgework may not be worn into surgery.  Leave suitcase in the car. After surgery it may be brought to your room.  For patients admitted to the hospital, checkout time is 11:00 AM the day of discharge.   Patients discharged the day of surgery will not be allowed to drive home.  :     Please read over the following fact sheets that you were given: Coughing and Deep Breathing, Surgical Site Infection Prevention, Anesthesia Post-op Instructions and Care and Recovery After Surgery    Cataract A cataract is a clouding of the lens of the eye. When a lens becomes cloudy, vision is reduced based on the degree and nature of the clouding. Many cataracts reduce vision to some degree. Some cataracts make people more near-sighted as they develop. Other cataracts increase glare. Cataracts that are ignored and become worse can sometimes look white. The white color can be seen through the pupil. CAUSES   Aging. However, cataracts may occur at any age, even in newborns.   Certain drugs.   Trauma to the eye.   Certain diseases such as diabetes.   Specific eye diseases such as chronic inflammation inside the eye or a sudden attack of a rare form of glaucoma.   Inherited or acquired medical problems.  SYMPTOMS   Gradual, progressive drop in vision in the affected eye.   Severe, rapid visual loss. This most often happens when trauma is the cause.  DIAGNOSIS  To detect a cataract, an eye doctor  examines the lens. Cataracts are best diagnosed with an exam of the eyes with the pupils enlarged (dilated) by drops.  TREATMENT  For an early cataract, vision may improve by using different eyeglasses or stronger lighting. If that does not help your vision, surgery is the only effective treatment. A cataract needs to be surgically removed when vision loss interferes with your everyday activities, such as driving, reading, or watching TV. A cataract may also have to be removed if it prevents examination or treatment of another eye problem. Surgery removes the cloudy lens and usually replaces it with a substitute lens (intraocular lens, IOL).  At a time when both you and your doctor agree, the cataract will be surgically removed. If you have cataracts in both eyes, only one is usually removed at a time. This allows the operated eye to heal and be out of danger from any possible problems after surgery (such as infection or poor wound healing). In rare cases, a cataract may be doing damage to your eye. In these cases, your caregiver may advise surgical removal right away. The vast majority of people who have cataract surgery have better vision afterward. HOME CARE INSTRUCTIONS  If you are not planning surgery, you may be asked to do the following:  Use different eyeglasses.   Use stronger or brighter lighting.   Ask your eye doctor about reducing your medicine dose  or changing medicines if it is thought that a medicine caused your cataract. Changing medicines does not make the cataract go away on its own.   Become familiar with your surroundings. Poor vision can lead to injury. Avoid bumping into things on the affected side. You are at a higher risk for tripping or falling.   Exercise extreme care when driving or operating machinery.   Wear sunglasses if you are sensitive to bright light or experiencing problems with glare.  SEEK IMMEDIATE MEDICAL CARE IF:   You have a worsening or sudden vision  loss.   You notice redness, swelling, or increasing pain in the eye.   You have a fever.  Document Released: 11/21/2005 Document Revised: 11/10/2011 Document Reviewed: 07/15/2011 [facility] Patient Information 2012 [place].PATIENT INSTRUCTIONS POST-ANESTHESIA  IMMEDIATELY FOLLOWING SURGERY:  Do not drive or operate machinery for the first twenty four hours after surgery.  Do not make any important decisions for twenty four hours after surgery or while taking narcotic pain medications or sedatives.  If you develop intractable nausea and vomiting or a severe headache please notify your doctor immediately.  FOLLOW-UP:  Please make an appointment with your surgeon as instructed. You do not need to follow up with anesthesia unless specifically instructed to do so.  WOUND CARE INSTRUCTIONS (if applicable):  Keep a dry clean dressing on the anesthesia/puncture wound site if there is drainage.  Once the wound has quit draining you may leave it open to air.  Generally you should leave the bandage intact for twenty four hours unless there is drainage.  If the epidural site drains for more than 36-48 hours please call the anesthesia department.  QUESTIONS?:  Please feel free to call your physician or the hospital operator if you have any questions, and they will be happy to assist you.

## 2017-05-08 ENCOUNTER — Inpatient Hospital Stay (HOSPITAL_COMMUNITY)
Admission: RE | Admit: 2017-05-08 | Discharge: 2017-05-08 | Disposition: A | Discharge status: Home / Self Care | Payer: Self-pay | Source: Ambulatory Visit

## 2017-05-08 ENCOUNTER — Encounter (HOSPITAL_COMMUNITY): Payer: Self-pay

## 2017-05-15 ENCOUNTER — Ambulatory Visit: Admit: 2017-05-15 | Payer: PRIVATE HEALTH INSURANCE | Admitting: Ophthalmology

## 2017-05-15 SURGERY — PHACOEMULSIFICATION, CATARACT, WITH IOL INSERTION
Anesthesia: Monitor Anesthesia Care | Site: Eye | Laterality: Right

## 2017-06-01 ENCOUNTER — Telehealth: Payer: Self-pay

## 2017-06-01 NOTE — Telephone Encounter (Signed)
Pt called- she said she wanted to cancel her procedure for next week. She said she does not have any insurance and would like to wait until she has insurance. She said she will call back to reschedule. Please cancel her procedures. Her number is [phone] if you need to speak with her.

## 2017-06-02 NOTE — Telephone Encounter (Signed)
LMOVM for Endo Scheduler to cancel pt's procedures for 06/09/17 with RMR. Routing to EG as FYI.

## 2017-06-09 ENCOUNTER — Encounter (HOSPITAL_COMMUNITY): Payer: Self-pay

## 2017-06-09 ENCOUNTER — Ambulatory Visit (HOSPITAL_COMMUNITY): Admit: 2017-06-09 | Payer: PRIVATE HEALTH INSURANCE | Admitting: Internal Medicine

## 2017-06-09 SURGERY — COLONOSCOPY
Anesthesia: Moderate Sedation

## 2017-06-09 NOTE — Telephone Encounter (Signed)
Noted  

## 2017-07-17 ENCOUNTER — Ambulatory Visit: Payer: PRIVATE HEALTH INSURANCE | Admitting: Nurse Practitioner

## 2017-09-06 ENCOUNTER — Ambulatory Visit: Payer: PRIVATE HEALTH INSURANCE | Admitting: Nurse Practitioner

## 2017-09-30 ENCOUNTER — Emergency Department (HOSPITAL_COMMUNITY): Payer: Self-pay

## 2017-09-30 ENCOUNTER — Emergency Department (HOSPITAL_COMMUNITY)
Admission: EM | Admit: 2017-09-30 | Discharge: 2017-09-30 | Disposition: A | Discharge status: Home / Self Care | Payer: Self-pay | Attending: Emergency Medicine | Admitting: Emergency Medicine

## 2017-09-30 ENCOUNTER — Encounter (HOSPITAL_COMMUNITY): Payer: Self-pay | Admitting: Emergency Medicine

## 2017-09-30 DIAGNOSIS — R1032 Left lower quadrant pain: Secondary | ICD-10-CM | POA: Insufficient documentation

## 2017-09-30 DIAGNOSIS — E785 Hyperlipidemia, unspecified: Secondary | ICD-10-CM | POA: Insufficient documentation

## 2017-09-30 DIAGNOSIS — Z79899 Other long term (current) drug therapy: Secondary | ICD-10-CM | POA: Insufficient documentation

## 2017-09-30 LAB — BASIC METABOLIC PANEL
ANION GAP: 10 (ref 5–15)
BUN: 10 mg/dL (ref 6–20)
CHLORIDE: 103 mmol/L (ref 101–111)
CO2: 25 mmol/L (ref 22–32)
Calcium: 9.3 mg/dL (ref 8.9–10.3)
Creatinine, Ser: 0.8 mg/dL (ref 0.44–1.00)
GFR calc Af Amer: >60 mL/min (ref >60)
GFR calc non Af Amer: >60 mL/min (ref >60)
Glucose, Bld: 98 mg/dL (ref 65–99)
POTASSIUM: 3.9 mmol/L (ref 3.5–5.1)
Sodium: 138 mmol/L (ref 135–145)

## 2017-09-30 LAB — URINALYSIS, ROUTINE W REFLEX MICROSCOPIC
BILIRUBIN URINE: NEGATIVE
Glucose, UA: NEGATIVE mg/dL
Hgb urine dipstick: NEGATIVE
Ketones, ur: NEGATIVE mg/dL
LEUKOCYTES UA: NEGATIVE
NITRITE: NEGATIVE
PH: 8 (ref 5.0–8.0)
Protein, ur: NEGATIVE mg/dL
SPECIFIC GRAVITY, URINE: 1.004 — AB (ref 1.005–1.030)

## 2017-09-30 LAB — CBC
HEMATOCRIT: 37.5 % (ref 36.0–46.0)
HEMOGLOBIN: 12.5 g/dL (ref 12.0–15.0)
MCH: 25.9 pg — ABNORMAL LOW (ref 26.0–34.0)
MCHC: 33.3 g/dL (ref 30.0–36.0)
MCV: 77.8 fL — AB (ref 78.0–100.0)
Platelets: 306 10*3/uL (ref 150–400)
RBC: 4.82 MIL/uL (ref 3.87–5.11)
RDW: 14.7 % (ref 11.5–15.5)
WBC: 4.2 10*3/uL (ref 4.0–10.5)

## 2017-09-30 MED ORDER — CAMPHOR-MENTHOL-METHYL SAL 1.2-5.7-6.3 % EX PTCH: 1.0000 | MEDICATED_PATCH | Freq: Every day | CUTANEOUS | 0 refills | Status: DC

## 2017-09-30 MED ORDER — POLYETHYLENE GLYCOL 3350 17 GM/SCOOP PO POWD: 1.0000 | Freq: Once | ORAL | 0 refills | Status: AC

## 2017-09-30 MED ORDER — KETOROLAC TROMETHAMINE 30 MG/ML IJ SOLN
30.0000 mg | Freq: Once | INTRAMUSCULAR | Status: AC
  Administered 2017-09-30: 30 mg via INTRAVENOUS
  Filled 2017-09-30: qty 1

## 2017-09-30 MED ORDER — SODIUM CHLORIDE 0.9 % IV BOLUS (SEPSIS)
1000.0000 mL | Freq: Once | INTRAVENOUS | Status: AC
  Administered 2017-09-30: 1000 mL via INTRAVENOUS

## 2017-09-30 MED ORDER — PANTOPRAZOLE SODIUM 40 MG PO TBEC
40.0000 mg | DELAYED_RELEASE_TABLET | Freq: Once | ORAL | Status: AC
  Administered 2017-09-30: 40 mg via ORAL
  Filled 2017-09-30: qty 1

## 2017-09-30 NOTE — ED Notes (Signed)
Bed: [facility]1 Expected date:  Expected time:  Means of arrival:  Comments: 

## 2017-09-30 NOTE — ED Triage Notes (Signed)
Per pt, states lower abdominal pain radiating to left lower back-states urinary frequency-slight nausea, no V/D

## 2017-09-30 NOTE — ED Notes (Signed)
Bed: [facility]2 Expected date:  Expected time:  Means of arrival:  Comments: 

## 2017-09-30 NOTE — Discharge Instructions (Signed)
Please see the information and instructions below regarding your visit.  Your diagnoses today include:  1. Left lower quadrant pain     Your exam and testing today is reassuring that there is not a condition causing your abdominal pain that we immediately need to intervene on at this time.   Abdominal (belly) pain can be caused by many things. Your caregiver performed an examination and possibly ordered blood/urine tests and imaging (CT scan, x-rays, ultrasound). Many cases can be observed and treated at home after initial evaluation in the emergency department. Even though you are being discharged home, abdominal pain can be unpredictable. Therefore, you need a repeated exam if your pain does not resolve, returns, or worsens. Most patients with abdominal pain don't have to be admitted to the hospital or have surgery, but serious problems like appendicitis and gallbladder attacks can start out as nonspecific pain. Many abdominal conditions cannot be diagnosed in one visit, so follow-up evaluations are very important.  Tests performed today include: Blood counts and electrolytes Blood tests to check liver and kidney function Blood tests to check pancreas function Urine test to look for infection and pregnancy (in women) Vital signs. See below for your results today.   See side panel of your discharge paperwork for testing performed today. Vital signs are listed at the bottom of these instructions.   Medications prescribed:    Take any prescribed medications only as prescribed, and any over the counter medications only as directed on the packaging.  Home care instructions:  Try eating, but start with foods that have a lot of fluid in them. Good examples are soup, Jell-O, and popsicles. If you do OK with those foods, you can try soft, bland foods, such as plain yogurt. Foods that are high in carbohydrates ("carbs"), like bread or saltine crackers, can help settle your stomach. Some people also  find that ginger helps with nausea. You should avoid foods that have a lot of fat in them. They can make nausea worse. Call your doctor if your symptoms come back when you try to eat.  Please follow any educational materials contained in this packet.   Follow-up instructions: Please follow-up with your primary care provider next week for further evaluation of your symptoms if they are not completely improved.    Return instructions:  Please return to the Emergency Department if you experience worsening symptoms.  SEEK IMMEDIATE MEDICAL ATTENTION IF: The pain does not go away or becomes severe  A temperature above 101F develops  Repeated vomiting occurs (multiple episodes)  The pain becomes localized to portions of the abdomen. The right side could possibly be appendicitis. In an adult, the left lower portion of the abdomen could be colitis or diverticulitis.  Blood is being passed in stools or vomit (bright red or black tarry stools)  You develop chest pain, difficulty breathing, dizziness or fainting, or become confused, poorly responsive, or inconsolable (young children) If you have any other emergent concerns regarding your health  Additional Information:  Your vital signs today were: BP 134/84    Pulse 76    Temp 98.3 F (36.8 C) (Oral)    Resp 17    SpO2 100%  If your blood pressure (BP) was elevated on multiple readings during this visit above 130 for the top number or above 80 for the bottom number, please have this repeated by your primary care provider within one month. --------------  Thank you for allowing [place] to participate in your care today.

## 2017-09-30 NOTE — ED Notes (Signed)
Medical necessity on incorrect pt.

## 2017-09-30 NOTE — ED Provider Notes (Signed)
[place] DEPT Provider Note   CSN: [id number] Arrival date & time: 09/30/17  0932     History   Chief Complaint Chief Complaint  Patient presents with  . Abdominal Pain    HPI Nicole Gilbert is a 57 y.o. female.  HPI   Patient is a 56 year old female with abdominal surgeries including hysterectomy 27 years ago and 2 C-sections, GERD, anxiety, hyperlipidemia presenting for 2-3 weeks of acute on chronic pelvic pain that radiates into the back.  Patient reports that the pain is most acute in left lower quadrant.  Patient reports that it is intermittent but 10 out of 10 at the worst.  Patient reports that it has worsened over the past 24-48 hours.  Patient reports she has also had associated bloating of the abdomen with her symptoms, however no change in pant size.  Patient reports she has been intermittently nauseous without vomiting.  Last bowel movement this morning, and normal for patient without melena or hematochezia.  No diarrhea.  No fever or chills. No history of diverticulitis or renal stones to patient's knowledge. Patient denies any dysuria, vaginal bleeding, vaginal discharge, saddle anesthesia, loss of bowel bladder control, extremity weakness, or extremity numbness.  No vaginal bleeding or vaginal discharge.  Patient is in a monogamous sexual relationship.  Patient is tried acetaminophen, and stool softeners for her symptoms without relief.  Her family history of colon cancer and a half brother, but no other abdominal malignancies in family history.  Past Medical History:  Diagnosis Date  . Anxiety   . Chronic constipation   . GERD (gastroesophageal reflux disease)   . Hemorrhoids 2006  . Hyperlipemia   . Insomnia     Patient Active Problem List   Diagnosis Date Noted  . Low iron 04/21/2017  . RLQ abdominal pain 11/22/2016  . Abdominal pain, epigastric 11/22/2016  . Microcytosis 11/22/2016  . Dyspepsia 10/29/2014  . Primary  osteoarthritis of right knee 10/09/2014  . Abdominal pain, unspecified site 08/21/2013  . Dizziness 07/14/2013  . Abdominal pain, left lower quadrant 01/21/2013  . OA (osteoarthritis) of knee 10/04/2012  . Synovitis of knee 10/04/2012  . Family history of colon cancer 08/03/2012  . 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 BUCKET HANDLE TEAR OF LATERAL MENISCUS 02/09/2009  . SCIATICA 01/29/2009  . BACK PAIN 01/29/2009  . KNEE, ARTHRITIS, DEGEN./OSTEO 01/15/2009  . JOINT EFFUSION, RIGHT KNEE 01/15/2009  . KNEE PAIN 01/15/2009  . ANXIETY 08/13/2008  . NEUROSIS 08/13/2008  . HEMORRHOIDS 08/13/2008  . GERD 08/13/2008  . CONSTIPATION, CHRONIC 08/13/2008  . WEIGHT LOSS 08/13/2008  . NAUSEA 08/13/2008  . DYSPHAGIA UNSPECIFIED 08/13/2008  . HEMATOCHEZIA, HX OF 08/13/2008    Past Surgical History:  Procedure Laterality Date  . ABDOMINAL HYSTERECTOMY    . BIOPSY  06/25/2014   Dr. Rourk:malignant features are not identified  . Carpal tunnel right hand    . CESAREAN SECTION     x2  . COLONOSCOPY   05/19/2008   Rourk-Prominent internal hemorrhoids and anal papilla, otherwise normal appearing rectal mucosa / Colonic mucosa appeared normal except for melanosis coli  . COLONOSCOPY  08/22/2012   Rourk- hyperplastic polyps and tubular adenoma, query infectious colitis vs NSAID effect. Next TCS 08/2017.  Marland Kitchen ESOPHAGOGASTRODUODENOSCOPY   01/20/2005   Rourk- Normal-appearing esophagus, status post passage of a 22 French Maloney  dilator/ small HH/  Otherwise normal stomach  . ESOPHAGOGASTRODUODENOSCOPY N/A 02/06/2013   FXT:KWIOXB esophagus. Small hiatal hernia. Antral erosions: bx no h.pylori.  Marland Kitchen  ESOPHAGOGASTRODUODENOSCOPY N/A 06/25/2014   Dr. Myra Gianotti nodule-status post biopsy; otherwise, normalEGD. No endoscopic explanation for patient's symptoms  . HEMORRHOID SURGERY  06/30/2008   Dr Arnoldo Morale  . KNEE ARTHROSCOPY     2010, RIGHT KNEE     OB History    No data available       Home  Medications    Prior to Admission medications   Medication Sig Start Date End Date Taking? Authorizing Provider  acetaminophen (TYLENOL) 500 MG tablet Take 1,000 mg by mouth every 6 (six) hours as needed for moderate pain or headache.   Yes [provider]  ibuprofen (ADVIL,MOTRIN) 200 MG tablet Take 400 mg by mouth every 6 (six) hours as needed. For pain   Yes [provider]  Misc Natural Products (OSTEO BI-FLEX ADV TRIPLE ST PO) Take 1 tablet by mouth daily.   Yes [provider]  multivitamin-iron-minerals-folic acid (CENTRUM) chewable tablet Chew 1 tablet by mouth daily.   Yes [provider]  psyllium (METAMUCIL) 58.6 % powder Take 1 packet by mouth daily.   Yes [provider]  Camphor-Menthol-Methyl Sal 1.2-5.7-6.3 % PTCH Apply 1 patch topically daily. 09/30/17   Langston Masker B, PA-C  omeprazole (PRILOSEC) 20 MG capsule Take 1 capsule (20 mg total) by mouth daily. Patient not taking: Reported on 09/30/2017 11/22/16   Mahala Menghini, PA-C  polyethylene glycol-electrolytes (TRILYTE) 420 g solution Take 4,000 mLs by mouth as directed. Patient not taking: Reported on 09/30/2017 04/21/17   Rourk, Cristopher Estimable, MD    Family History Family History  Problem Relation Age of Onset  . Diabetes Father   . Colon cancer Brother 72       (paternal half brother)  . Coronary artery disease Maternal Grandmother   . Lung cancer Other     Social History Social History  Substance Use Topics  . Smoking status: Never Smoker  . Smokeless tobacco: Never Used  . Alcohol use Yes     Comment: rare     Allergies   Morphine and related and Shellfish allergy   Review of Systems Review of Systems  Constitutional: Negative for chills and fever.  HENT: Negative for congestion, rhinorrhea, sinus pain and sore throat.   Eyes: Negative for visual disturbance.  Respiratory: Negative for cough, chest tightness and shortness of breath.   Cardiovascular:  Negative for chest pain, palpitations and leg swelling.  Gastrointestinal: Negative for abdominal pain, constipation, diarrhea, nausea and vomiting.  Genitourinary: Negative for dysuria and flank pain.  Musculoskeletal: Negative for back pain and myalgias.  Skin: Negative for rash.  Neurological: Negative for dizziness, syncope, light-headedness and headaches.     Physical Exam Updated Vital Signs BP 134/84   Pulse 76   Temp 98.3 F (36.8 C) (Oral)   Resp 17   SpO2 100%   Physical Exam  Constitutional: She appears well-developed and well-nourished. No distress.  HENT:  Head: Normocephalic and atraumatic.  Mouth/Throat: Oropharynx is clear and moist.  Eyes: Pupils are equal, round, and reactive to light. Conjunctivae and EOM are normal.  Neck: Normal range of motion. Neck supple.  Cardiovascular: Normal rate, regular rhythm, S1 normal and S2 normal.   No murmur heard. Pulmonary/Chest: Effort normal and breath sounds normal. She has no wheezes. She has no rales.  Abdominal: Soft. She exhibits no distension. There is tenderness. There is no guarding.  Tenderness to palpation in the left lower quadrant.  No rebound.  No suprapubic or right lower quadrant tenderness.  Musculoskeletal: Normal range of motion. She exhibits no edema or deformity.  Lymphadenopathy:    She has no cervical adenopathy.  Neurological: She is alert.  Cranial nerves grossly intact. Spine Exam: Inspection/Palpation: No tenderness to palpation of cervical, thoracic, or lumbar spine.  No tenderness to palpation of paraspinal musculature. Strength: 5/5 throughout LE bilaterally (hip flexion/extension, adduction/abduction; knee flexion/extension; foot dorsiflexion/plantarflexion, inversion/eversion; great toe inversion) Sensation: Intact to light touch in proximal and distal LE bilaterally Reflexes: 2+ quadriceps and achilles reflexes Gait: Gait symmetric with intact heel walking, toe walking.  Normal  coordination.  No ataxia.  Skin: Skin is warm and dry. No rash noted. No erythema.  Psychiatric: She has a normal mood and affect. Her behavior is normal. Judgment and thought content normal.  Nursing note and vitals reviewed.    ED Treatments / Results  Labs (all labs ordered are listed, but only abnormal results are displayed) Labs Reviewed  URINALYSIS, ROUTINE W REFLEX MICROSCOPIC - Abnormal; Notable for the following:       Result Value   Color, Urine STRAW (*)    Specific Gravity, Urine 1.004 (*)    All other components within normal limits  CBC - Abnormal; Notable for the following:    MCV 77.8 (*)    MCH 25.9 (*)    All other components within normal limits  BASIC METABOLIC PANEL    EKG  EKG Interpretation None       Radiology Ct Renal Stone Study  Result Date: 09/30/2017 CLINICAL DATA:  Lower abdominal pain radiating to the back EXAM: CT ABDOMEN AND PELVIS WITHOUT CONTRAST TECHNIQUE: Multidetector CT imaging of the abdomen and pelvis was performed following the standard protocol without IV contrast. COMPARISON:  11/16/2016, 11/20/2014 FINDINGS: Lower chest: Tiny 4 mm subpleural left lower lobe pulmonary nodule laterally, series 4, image number 9. Stable 3 mm left lower lobe subpleural posterior pulmonary nodule, benign. No acute consolidation or effusion. Normal heart size. Hepatobiliary: No focal liver abnormality is seen. No gallstones, gallbladder wall thickening, or biliary dilatation. Pancreas: Unremarkable. No pancreatic ductal dilatation or surrounding inflammatory changes. Spleen: Normal in size without focal abnormality. Adrenals/Urinary Tract: Adrenal glands are unremarkable. Kidneys are normal, without renal calculi, focal lesion, or hydronephrosis. Bladder is unremarkable. Stomach/Bowel: Stomach is within normal limits. Appendix not well seen but no right lower quadrant inflammation. No evidence of bowel wall thickening, distention, or inflammatory changes.  Vascular/Lymphatic: Nonaneurysmal aorta. No significantly enlarged lymph nodes. Reproductive: Status post hysterectomy. No adnexal masses. Other: Negative for free air or free fluid. Musculoskeletal: No acute or significant osseous findings. IMPRESSION: 1. Negative for nephrolithiasis, hydronephrosis or ureteral stone 2. No CT evidence for acute intraabdominal or pelvic abnormality 3. 4 mm left lower lobe pulmonary nodule. No follow-up needed if patient is low-risk. Non-contrast chest CT can be considered in 12 months if patient is high-risk. This recommendation follows the consensus statement: Guidelines for Management of Incidental Pulmonary Nodules Detected on CT Images: From the Fleischner Society 2017; Radiology 2017; 284:228-243. Electronically Signed   By: Donavan Foil M.D.   On: 09/30/2017 16:05    Procedures Procedures (including critical care time)  Medications Ordered in ED Medications  sodium chloride 0.9 % bolus 1,000 mL (0 mLs Intravenous Stopped 09/30/17 1718)  ketorolac (TORADOL) 30 MG/ML injection 30 mg (30 mg Intravenous Given 09/30/17 1451)  pantoprazole (PROTONIX) EC tablet 40 mg (40 mg Oral Given 09/30/17 1621)     Initial Impression / Assessment and Plan / ED Course  I have  reviewed the triage vital signs and the nursing notes.  Pertinent labs & imaging results that were available during my care of the patient were reviewed by me and considered in my medical decision making (see chart for details).      Final Clinical Impressions(s) / ED Diagnoses   Final diagnoses:  Left lower quadrant pain    56 year old female with abdominal surgical history significant for abdominal hysterectomy, and C-section presenting for acute on chronic lower abdominal pain times 2-3 weeks.  Differential diagnosis includes diverticulitis, nephrolithiasis, ovarian or adnexal mass.  Will prescribe Toradol for pain control, and obtain CT renal stone study to evaluate for these conditions.   Case discussed with Dr. Charlesetta Shanks.  CT renal stone study demonstrates no acute findings. CT, reviewed with Dr. Charlesetta Shanks, demonstrated moderate stool burden and constipation may be contributing to clinical picture. Stable pulmonary nodules which were noted to patient, which she has previously followed. CT report provided to patient to guide management with PCP.  Patient well-appearing, in no acute distress on reevaluation, afebrile, and demonstrating no leukocytosis or other laboratory abnormality. Urinalysis with no acute abnormality. Discussed pelvic exam and obtaining STI cultures but patient deferred this at this time due to being doubtful of exposure and hysterectomy history. Patient to be discharged with Miralax prescription and SalonPAS to attempt for back discomfort. Return precautions given for any fever, chills, intractable nausea or vomiting, worsening pain, or blood in stools. Discussed close PCP follow up.  This is a supervised visit with Dr. Charlesetta Shanks. Evaluation, management, and discharge planning discussed with this attending physician.  New Prescriptions Discharge Medication List as of 09/30/2017  5:05 PM    START taking these medications   Details  Camphor-Menthol-Methyl Sal 1.2-5.7-6.3 % PTCH Apply 1 patch topically daily., Starting Sat 09/30/2017, Print    polyethylene glycol powder (MIRALAX) powder Take 255 g by mouth once., Starting Sat 09/30/2017, Print         [place], Madison B, PA-C 10/01/17 0202    Charlesetta Shanks, MD 10/01/17 [phone]

## 2021-03-04 ENCOUNTER — Inpatient Hospital Stay (HOSPITAL_COMMUNITY)
Admission: EM | Admit: 2021-03-04 | Discharge: 2021-03-09 | DRG: 392 | Disposition: A | Discharge status: Home / Self Care | Payer: BC Managed Care – PPO | Attending: Student | Admitting: Student

## 2021-03-04 ENCOUNTER — Emergency Department (HOSPITAL_COMMUNITY): Payer: BC Managed Care – PPO

## 2021-03-04 ENCOUNTER — Encounter (HOSPITAL_COMMUNITY): Payer: Self-pay

## 2021-03-04 ENCOUNTER — Other Ambulatory Visit: Payer: Self-pay

## 2021-03-04 DIAGNOSIS — R748 Abnormal levels of other serum enzymes: Secondary | ICD-10-CM | POA: Diagnosis not present

## 2021-03-04 DIAGNOSIS — K529 Noninfective gastroenteritis and colitis, unspecified: Secondary | ICD-10-CM | POA: Diagnosis present

## 2021-03-04 DIAGNOSIS — K5909 Other constipation: Secondary | ICD-10-CM | POA: Diagnosis present

## 2021-03-04 DIAGNOSIS — E785 Hyperlipidemia, unspecified: Secondary | ICD-10-CM | POA: Diagnosis present

## 2021-03-04 DIAGNOSIS — D72818 Other decreased white blood cell count: Secondary | ICD-10-CM | POA: Diagnosis present

## 2021-03-04 DIAGNOSIS — Z20822 Contact with and (suspected) exposure to covid-19: Secondary | ICD-10-CM | POA: Diagnosis present

## 2021-03-04 DIAGNOSIS — Z79899 Other long term (current) drug therapy: Secondary | ICD-10-CM

## 2021-03-04 DIAGNOSIS — Z800 Family history of malignant neoplasm of digestive organs: Secondary | ICD-10-CM

## 2021-03-04 DIAGNOSIS — R197 Diarrhea, unspecified: Secondary | ICD-10-CM | POA: Diagnosis not present

## 2021-03-04 DIAGNOSIS — Z6830 Body mass index (BMI) 30.0-30.9, adult: Secondary | ICD-10-CM

## 2021-03-04 DIAGNOSIS — R933 Abnormal findings on diagnostic imaging of other parts of digestive tract: Secondary | ICD-10-CM

## 2021-03-04 DIAGNOSIS — K219 Gastro-esophageal reflux disease without esophagitis: Secondary | ICD-10-CM | POA: Diagnosis present

## 2021-03-04 DIAGNOSIS — R1084 Generalized abdominal pain: Secondary | ICD-10-CM | POA: Diagnosis not present

## 2021-03-04 DIAGNOSIS — R109 Unspecified abdominal pain: Secondary | ICD-10-CM | POA: Diagnosis present

## 2021-03-04 DIAGNOSIS — D709 Neutropenia, unspecified: Secondary | ICD-10-CM | POA: Diagnosis not present

## 2021-03-04 DIAGNOSIS — Z90710 Acquired absence of both cervix and uterus: Secondary | ICD-10-CM

## 2021-03-04 DIAGNOSIS — E669 Obesity, unspecified: Secondary | ICD-10-CM | POA: Diagnosis present

## 2021-03-04 DIAGNOSIS — E6609 Other obesity due to excess calories: Secondary | ICD-10-CM | POA: Diagnosis not present

## 2021-03-04 DIAGNOSIS — R1031 Right lower quadrant pain: Secondary | ICD-10-CM | POA: Diagnosis not present

## 2021-03-04 DIAGNOSIS — D708 Other neutropenia: Secondary | ICD-10-CM | POA: Diagnosis present

## 2021-03-04 LAB — CBC
HCT: 42.6 % (ref 36.0–46.0)
Hemoglobin: 13.6 g/dL (ref 12.0–15.0)
MCH: 25.7 pg — ABNORMAL LOW (ref 26.0–34.0)
MCHC: 31.9 g/dL (ref 30.0–36.0)
MCV: 80.5 fL (ref 80.0–100.0)
Platelets: 282 10*3/uL (ref 150–400)
RBC: 5.29 MIL/uL — ABNORMAL HIGH (ref 3.87–5.11)
RDW: 14.8 % (ref 11.5–15.5)
WBC: 6.4 10*3/uL (ref 4.0–10.5)
nRBC: 0 % (ref 0.0–0.2)

## 2021-03-04 LAB — COMPREHENSIVE METABOLIC PANEL
ALT: 19 U/L (ref 0–44)
AST: 22 U/L (ref 15–41)
Albumin: 4.1 g/dL (ref 3.5–5.0)
Alkaline Phosphatase: 48 U/L (ref 38–126)
Anion gap: 8 (ref 5–15)
BUN: 11 mg/dL (ref 8–23)
CO2: 25 mmol/L (ref 22–32)
Calcium: 9.8 mg/dL (ref 8.9–10.3)
Chloride: 106 mmol/L (ref 98–111)
Creatinine, Ser: 0.76 mg/dL (ref 0.44–1.00)
GFR, Estimated: >60 mL/min (ref >60)
Glucose, Bld: 114 mg/dL — ABNORMAL HIGH (ref 70–99)
Potassium: 3.9 mmol/L (ref 3.5–5.1)
Sodium: 139 mmol/L (ref 135–145)
Total Bilirubin: 1.2 mg/dL (ref 0.3–1.2)
Total Protein: 7.9 g/dL (ref 6.5–8.1)

## 2021-03-04 LAB — URINALYSIS, ROUTINE W REFLEX MICROSCOPIC
Bilirubin Urine: NEGATIVE
Glucose, UA: NEGATIVE mg/dL
Hgb urine dipstick: NEGATIVE
Ketones, ur: NEGATIVE mg/dL
Leukocytes,Ua: NEGATIVE
Nitrite: NEGATIVE
Protein, ur: NEGATIVE mg/dL
Specific Gravity, Urine: 1.018 (ref 1.005–1.030)
pH: 5 (ref 5.0–8.0)

## 2021-03-04 LAB — LIPASE, BLOOD: Lipase: 33 U/L (ref 11–51)

## 2021-03-04 LAB — RESP PANEL BY RT-PCR (FLU A&B, COVID) ARPGX2
Influenza A by PCR: NEGATIVE
Influenza B by PCR: NEGATIVE
SARS Coronavirus 2 by RT PCR: NEGATIVE

## 2021-03-04 MED ORDER — FENTANYL CITRATE (PF) 100 MCG/2ML IJ SOLN: 50.0000 ug | Freq: Once | INTRAMUSCULAR | Status: DC

## 2021-03-04 MED ORDER — PIPERACILLIN-TAZOBACTAM 3.375 G IVPB 30 MIN
3.3750 g | Freq: Once | INTRAVENOUS | Status: AC
  Administered 2021-03-04: 3.375 g via INTRAVENOUS
  Filled 2021-03-04: qty 50

## 2021-03-04 MED ORDER — FENTANYL CITRATE (PF) 100 MCG/2ML IJ SOLN
50.0000 ug | Freq: Once | INTRAMUSCULAR | Status: AC
  Administered 2021-03-04: 50 ug via INTRAVENOUS
  Filled 2021-03-04: qty 2

## 2021-03-04 MED ORDER — ONDANSETRON HCL 4 MG/2ML IJ SOLN
4.0000 mg | Freq: Once | INTRAMUSCULAR | Status: AC
  Administered 2021-03-04: 4 mg via INTRAVENOUS
  Filled 2021-03-04: qty 2

## 2021-03-04 MED ORDER — SODIUM CHLORIDE 0.9 % IV BOLUS
500.0000 mL | Freq: Once | INTRAVENOUS | Status: AC
  Administered 2021-03-04: 500 mL via INTRAVENOUS

## 2021-03-04 MED ORDER — ONDANSETRON HCL 4 MG/2ML IJ SOLN: 4.0000 mg | Freq: Four times a day (QID) | INTRAMUSCULAR | Status: DC | PRN

## 2021-03-04 MED ORDER — FENTANYL CITRATE (PF) 100 MCG/2ML IJ SOLN
100.0000 ug | Freq: Once | INTRAMUSCULAR | Status: AC
  Administered 2021-03-04: 100 ug via INTRAVENOUS
  Filled 2021-03-04: qty 2

## 2021-03-04 MED ORDER — SODIUM CHLORIDE 0.9 % IV SOLN: INTRAVENOUS | Status: AC

## 2021-03-04 MED ORDER — SODIUM CHLORIDE 0.9 % IV SOLN: Freq: Once | INTRAVENOUS | Status: AC

## 2021-03-04 MED ORDER — IOHEXOL 300 MG/ML  SOLN
80.0000 mL | Freq: Once | INTRAMUSCULAR | Status: AC | PRN
  Administered 2021-03-04: 100 mL via INTRAVENOUS

## 2021-03-04 MED ORDER — ACETAMINOPHEN 650 MG RE SUPP
650.0000 mg | Freq: Four times a day (QID) | RECTAL | Status: DC | PRN
  Administered 2021-03-05: 650 mg via RECTAL
  Filled 2021-03-04: qty 1

## 2021-03-04 MED ORDER — HEPARIN SODIUM (PORCINE) 5000 UNIT/ML IJ SOLN
5000.0000 [IU] | Freq: Three times a day (TID) | INTRAMUSCULAR | Status: DC
  Administered 2021-03-05 – 2021-03-09 (×13): 5000 [IU] via SUBCUTANEOUS
  Filled 2021-03-04 (×13): qty 1

## 2021-03-04 MED ORDER — ACETAMINOPHEN 325 MG PO TABS
650.0000 mg | ORAL_TABLET | Freq: Four times a day (QID) | ORAL | Status: DC | PRN
  Administered 2021-03-05 – 2021-03-08 (×9): 650 mg via ORAL
  Filled 2021-03-04 (×10): qty 2

## 2021-03-04 MED ORDER — FENTANYL CITRATE (PF) 100 MCG/2ML IJ SOLN
12.5000 ug | INTRAMUSCULAR | Status: DC | PRN
  Administered 2021-03-05: 12.5 ug via INTRAVENOUS
  Filled 2021-03-04: qty 2

## 2021-03-04 MED ORDER — PIPERACILLIN-TAZOBACTAM 3.375 G IVPB
3.3750 g | Freq: Three times a day (TID) | INTRAVENOUS | Status: DC
  Administered 2021-03-05 – 2021-03-07 (×7): 3.375 g via INTRAVENOUS
  Filled 2021-03-04 (×7): qty 50

## 2021-03-04 NOTE — ED Notes (Signed)
Admitting Dr. At bedside right now. Will send pt in the floor after DR done talking with the patient.

## 2021-03-04 NOTE — ED Notes (Signed)
ED TO INPATIENT HANDOFF REPORT  Name/Age/Gender Nicole Gilbert 60 y.o. female  Code Status Code Status History    Date Active Date Inactive Code Status Order ID Comments User Context   07/14/2013 0746 07/15/2013 1437 Full Code [id number]  Doree Albee, MD ED   Advance Care Planning Activity    Questions for Most Recent Historical Code Status (Order [id number])       Home/SNF/Other Home  Chief Complaint Abdominal pain [R10.9]  Level of Care/Admitting Diagnosis ED Disposition    ED Disposition Condition [place] [facility] Area: [facility] [[id number]]  Level of Care: Telemetry [5]  Admit to tele based on following criteria: Complex arrhythmia (Bradycardia/Tachycardia)  May admit patient to [facility] or [facility] if equivalent level of care is available:: Yes  Covid Evaluation: Asymptomatic Screening Protocol (No Symptoms)  Diagnosis: Abdominal pain [536468]  Admitting Physician: Shela Leff [[id number]]  Attending Physician: Shela Leff [[id number]]  Estimated length of stay: past midnight tomorrow  Certification:: I certify this patient will need inpatient services for at least 2 midnights       Medical History Past Medical History:  Diagnosis Date  . Anxiety   . Chronic constipation   . GERD (gastroesophageal reflux disease)   . Hemorrhoids 2006  . Hyperlipemia   . Insomnia     Allergies Allergies  Allergen Reactions  . Morphine And Related Nausea And Vomiting  . Shellfish Allergy Hives and Itching    IV Location/Drains/Wounds Patient Lines/Drains/Airways Status    Active Line/Drains/Airways    Name Placement date Placement time Site Days   Peripheral IV 03/04/21 Right Antecubital 03/04/21  1710  Antecubital  less than 1          Labs/Imaging Results for orders placed or performed during the hospital encounter of 03/04/21 (from the past 48 hour(s))  Lipase, blood     Status: None   Collection Time: 03/04/21  5:19  PM  Result Value Ref Range   Lipase 33 11 - 51 U/L    Comment: Performed at [facility], [place] [address], [place], [place] [address]  Comprehensive metabolic panel     Status: Abnormal   Collection Time: 03/04/21  5:19 PM  Result Value Ref Range   Sodium 139 135 - 145 mmol/L   Potassium 3.9 3.5 - 5.1 mmol/L   Chloride 106 98 - 111 mmol/L   CO2 25 22 - 32 mmol/L   Glucose, Bld 114 (H) 70 - 99 mg/dL    Comment: Glucose reference range applies only to samples taken after fasting for at least 8 hours.   BUN 11 8 - 23 mg/dL   Creatinine, Ser 0.76 0.44 - 1.00 mg/dL   Calcium 9.8 8.9 - 10.3 mg/dL   Total Protein 7.9 6.5 - 8.1 g/dL   Albumin 4.1 3.5 - 5.0 g/dL   AST 22 15 - 41 U/L   ALT 19 0 - 44 U/L   Alkaline Phosphatase 48 38 - 126 U/L   Total Bilirubin 1.2 0.3 - 1.2 mg/dL   GFR, Estimated >60 >60 mL/min    Comment: (NOTE) Calculated using the CKD-EPI Creatinine Equation (2021)    Anion gap 8 5 - 15    Comment: Performed at [facility], [place] [address], [place], [place] [address]  CBC     Status: Abnormal   Collection Time: 03/04/21  5:19 PM  Result Value Ref Range   WBC 6.4 4.0 - 10.5  K/uL   RBC 5.29 (H) 3.87 - 5.11 MIL/uL   Hemoglobin 13.6 12.0 - 15.0 g/dL   HCT 42.6 36.0 - 46.0 %   MCV 80.5 80.0 - 100.0 fL   MCH 25.7 (L) 26.0 - 34.0 pg   MCHC 31.9 30.0 - 36.0 g/dL   RDW 14.8 11.5 - 15.5 %   Platelets 282 150 - 400 K/uL   nRBC 0.0 0.0 - 0.2 %    Comment: Performed at [facility], [place] [address], [place], [place] [address]  Urinalysis, Routine w reflex microscopic     Status: None   Collection Time: 03/04/21  5:20 PM  Result Value Ref Range   Color, Urine YELLOW YELLOW   APPearance CLEAR CLEAR   Specific Gravity, Urine 1.018 1.005 - 1.030   pH 5.0 5.0 - 8.0   Glucose, UA NEGATIVE NEGATIVE mg/dL   Hgb urine dipstick NEGATIVE NEGATIVE   Bilirubin Urine NEGATIVE NEGATIVE   Ketones, ur NEGATIVE NEGATIVE mg/dL    Protein, ur NEGATIVE NEGATIVE mg/dL   Nitrite NEGATIVE NEGATIVE   Leukocytes,Ua NEGATIVE NEGATIVE    Comment: Performed at [facility] [address], [place] [address] [place], [place] [address]   CT Abdomen Pelvis W Contrast  Result Date: 03/04/2021 CLINICAL DATA:  Mid abdominal pain and nausea. EXAM: CT ABDOMEN AND PELVIS WITH CONTRAST TECHNIQUE: Multidetector CT imaging of the abdomen and pelvis was performed using the standard protocol following bolus administration of intravenous contrast. CONTRAST:  187mL OMNIPAQUE IOHEXOL 300 MG/ML  SOLN COMPARISON:  September 30, 2017 FINDINGS: Lower chest: No acute abnormality. Hepatobiliary: No focal liver abnormality is seen. No gallstones, gallbladder wall thickening, or biliary dilatation. Pancreas: Unremarkable. No pancreatic ductal dilatation or surrounding inflammatory changes. Spleen: Normal in size without focal abnormality. Adrenals/Urinary Tract: Adrenal glands are unremarkable. Kidneys are normal, without renal calculi, focal lesion, or hydronephrosis. Bladder is unremarkable. Stomach/Bowel: Normal appearance of the stomach. Nondistended small bowels with evidence of fecalization of the distal ileum the colon is decompressed. The appendix is not identified. Mild inflammatory changes along the distal cecum. Small amount of fluid in the right lower quadrant and in the pelvis. Vascular/Lymphatic: No significant vascular findings are present. No enlarged abdominal or pelvic lymph nodes. Reproductive: Status post hysterectomy. No adnexal masses. Other: No abdominal wall hernia or abnormality. Musculoskeletal: No acute or significant osseous findings. IMPRESSION: 1. Nondistended small bowels with evidence of mild mucosal thickening and fecalization of the distal ileum, with surrounding mild inflammatory changes and small amount of free fluid in the right lower quadrant and pelvis. 2. The appendix is not identified. Has the patient had an appendectomy? 3.  Differential diagnosis includes infectious or inflammatory enterocolitis involving predominantly the distal ileum. Alternatively, if the patient has not had an appendectomy, this could potentially represent ruptured appendicitis with reactive inflammatory changes of the adjacent bowel loops. No evidence of abscess formation. Please correlate clinically. 4. No evidence of acute abnormalities within the solid abdominal organs. Electronically Signed   By: Fidela Salisbury M.D.   On: 03/04/2021 19:58    Pending Labs Unresulted Labs (From admission, onward)          Start     Ordered   03/04/21 2025  Resp Panel by RT-PCR (Flu A&B, Covid) Nasopharyngeal Swab  (Tier 2 - Symptomatic/asymptomatic with Precautions )  Once,   STAT       Question Answer Comment  Is this test for diagnosis or screening Screening   Symptomatic for COVID-19 as defined  by CDC No   Hospitalized for COVID-19 No   Admitted to ICU for COVID-19 No   Previously tested for COVID-19 No   Resident in a congregate (group) care setting No   Employed in healthcare setting Unknown   Pregnant No   Has patient completed COVID vaccination(s) (2 doses of Pfizer/Moderna 1 dose of [employer]) Yes   Has patient completed COVID Booster / 3rd dose Yes      03/04/21 2026          Vitals/Pain Today's Vitals   03/04/21 2100 03/04/21 2101 03/04/21 2110 03/04/21 2130  BP: 119/88   121/84  Pulse: 85 79  66  Resp: 16 13  12   Temp:    98.5 F (36.9 C)  TempSrc:    Oral  SpO2: (!) 88% 96%  98%  Weight:      Height:      PainSc:   Asleep     Isolation Precautions No active isolations  Medications Medications  fentaNYL (SUBLIMAZE) injection 50 mcg (50 mcg Intravenous Given 03/04/21 1712)  ondansetron (ZOFRAN) injection 4 mg (4 mg Intravenous Given 03/04/21 1712)  sodium chloride 0.9 % bolus 500 mL (0 mLs Intravenous Stopped 03/04/21 1949)  fentaNYL (SUBLIMAZE) injection 100 mcg (100 mcg Intravenous Given 03/04/21 1941)   iohexol (OMNIPAQUE) 300 MG/ML solution 80 mL (100 mLs Intravenous Contrast Given 03/04/21 1928)  piperacillin-tazobactam (ZOSYN) IVPB 3.375 g (0 g Intravenous Stopped 03/04/21 2131)  0.9 %  sodium chloride infusion ( Intravenous New Bag/Given 03/04/21 2041)  ondansetron (ZOFRAN) injection 4 mg (4 mg Intravenous Given 03/04/21 2040)  fentaNYL (SUBLIMAZE) injection 100 mcg (100 mcg Intravenous Given 03/04/21 2040)    Mobility walks with device

## 2021-03-04 NOTE — ED Provider Notes (Signed)
[facility] DEPT Provider Note   CSN: [id number] Arrival date & time: 03/04/21  1620     History Chief Complaint  Patient presents with  . Abdominal Pain  . Nausea    Nicole Gilbert is a 60 y.o. female.  She is here with a complaint of generalized abdominal pain crampy in nature severe that started this morning.  Associated with some nausea and some dry heaves.  No diarrhea, is having a little bit of constipation.  No fevers or chills.  No urinary symptoms.  Has tried nothing for it.  Occurred after eating oatmeal banana and some popcorn.  History of cesarean section x2.  The history is provided by the patient.  Abdominal Pain Pain location:  Generalized Pain quality: cramping   Pain radiates to:  Does not radiate Pain severity:  Severe Onset quality:  Gradual Timing:  Constant Progression:  Worsening Chronicity:  New Context: not trauma   Relieved by:  None tried Worsened by:  Nothing Ineffective treatments:  None tried Associated symptoms: constipation, nausea and vomiting   Associated symptoms: no chest pain, no cough, no diarrhea, no dysuria, no fever, no hematemesis, no hematochezia, no hematuria, no melena, no shortness of breath, no sore throat, no vaginal bleeding and no vaginal discharge        Past Medical History:  Diagnosis Date  . Anxiety   . Chronic constipation   . GERD (gastroesophageal reflux disease)   . Hemorrhoids 2006  . Hyperlipemia   . Insomnia     Patient Active Problem List   Diagnosis Date Noted  . Low iron 04/21/2017  . RLQ abdominal pain 11/22/2016  . Abdominal pain, epigastric 11/22/2016  . Microcytosis 11/22/2016  . Dyspepsia 10/29/2014  . Primary osteoarthritis of right knee 10/09/2014  . Abdominal pain, unspecified site 08/21/2013  . Dizziness 07/14/2013  . Abdominal pain, left lower quadrant 01/21/2013  . OA (osteoarthritis) of knee 10/04/2012  . Synovitis of knee 10/04/2012  . Family history of  colon cancer 08/03/2012  . 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 BUCKET HANDLE TEAR OF LATERAL MENISCUS 02/09/2009  . SCIATICA 01/29/2009  . BACK PAIN 01/29/2009  . KNEE, ARTHRITIS, DEGEN./OSTEO 01/15/2009  . JOINT EFFUSION, RIGHT KNEE 01/15/2009  . KNEE PAIN 01/15/2009  . ANXIETY 08/13/2008  . NEUROSIS 08/13/2008  . HEMORRHOIDS 08/13/2008  . GERD 08/13/2008  . CONSTIPATION, CHRONIC 08/13/2008  . WEIGHT LOSS 08/13/2008  . NAUSEA 08/13/2008  . DYSPHAGIA UNSPECIFIED 08/13/2008  . HEMATOCHEZIA, HX OF 08/13/2008    Past Surgical History:  Procedure Laterality Date  . ABDOMINAL HYSTERECTOMY    . BIOPSY  06/25/2014   Dr. Rourk:malignant features are not identified  . Carpal tunnel right hand    . CESAREAN SECTION     x2  . COLONOSCOPY   05/19/2008   Rourk-Prominent internal hemorrhoids and anal papilla, otherwise normal appearing rectal mucosa / Colonic mucosa appeared normal except for melanosis coli  . COLONOSCOPY  08/22/2012   Rourk- hyperplastic polyps and tubular adenoma, query infectious colitis vs NSAID effect. Next TCS 08/2017.  Marland Kitchen ESOPHAGOGASTRODUODENOSCOPY   01/20/2005   Rourk- Normal-appearing esophagus, status post passage of a 55 French Maloney  dilator/ small HH/  Otherwise normal stomach  . ESOPHAGOGASTRODUODENOSCOPY N/A 02/06/2013   UUV:OZDGUY esophagus. Small hiatal hernia. Antral erosions: bx no h.pylori.  . ESOPHAGOGASTRODUODENOSCOPY N/A 06/25/2014   Dr. Myra Gianotti nodule-status post biopsy; otherwise, normalEGD. No endoscopic explanation for patient's symptoms  . HEMORRHOID SURGERY  06/30/2008   Dr Arnoldo Morale  .  KNEE ARTHROSCOPY     2010, RIGHT KNEE      OB History   No obstetric history on file.     Family History  Problem Relation Age of Onset  . Diabetes Father   . Colon cancer Brother 67       (paternal half brother)  . Coronary artery disease Maternal Grandmother   . Lung cancer Other     Social History   Tobacco Use  . Smoking status: Never  Smoker  . Smokeless tobacco: Never Used  Vaping Use  . Vaping Use: Some days  . Substances: Nicotine, Flavoring  Substance Use Topics  . Alcohol use: Yes    Comment: rare  . Drug use: No    Home Medications Prior to Admission medications   Medication Sig Start Date End Date Taking? Authorizing Provider  acetaminophen (TYLENOL) 500 MG tablet Take 1,000 mg by mouth every 6 (six) hours as needed for moderate pain or headache.    [provider]  Camphor-Menthol-Methyl Sal 1.2-5.7-6.3 % PTCH Apply 1 patch topically daily. 09/30/17   Langston Masker B, PA-C  ibuprofen (ADVIL,MOTRIN) 200 MG tablet Take 400 mg by mouth every 6 (six) hours as needed. For pain    [provider]  Misc Natural Products (OSTEO BI-FLEX ADV TRIPLE ST PO) Take 1 tablet by mouth daily.    [provider]  multivitamin-iron-minerals-folic acid (CENTRUM) chewable tablet Chew 1 tablet by mouth daily.    [provider]  omeprazole (PRILOSEC) 20 MG capsule Take 1 capsule (20 mg total) by mouth daily. Patient not taking: Reported on 09/30/2017 11/22/16   Mahala Menghini, PA-C  polyethylene glycol-electrolytes (TRILYTE) 420 g solution Take 4,000 mLs by mouth as directed. Patient not taking: Reported on 09/30/2017 04/21/17   Daneil Dolin, MD  psyllium (METAMUCIL) 58.6 % powder Take 1 packet by mouth daily.    [provider]    Allergies    Morphine and related and Shellfish allergy  Review of Systems   Review of Systems  Constitutional: Negative for fever.  HENT: Negative for sore throat.   Eyes: Negative for visual disturbance.  Respiratory: Negative for cough and shortness of breath.   Cardiovascular: Negative for chest pain.  Gastrointestinal: Positive for abdominal pain, constipation, nausea and vomiting. Negative for diarrhea, hematemesis, hematochezia and melena.  Genitourinary: Negative for dysuria, hematuria, vaginal bleeding and vaginal discharge.   Musculoskeletal: Negative for neck pain.  Skin: Negative for rash.  Neurological: Negative for headaches.    Physical Exam Updated Vital Signs BP (!) 181/109 (BP Location: Right Arm)   Pulse 85   Temp 98.3 F (36.8 C) (Oral)   Resp 18   Ht 5\' 1"  (1.549 m)   Wt 72.6 kg   BMI 30.23 kg/m   Physical Exam Vitals and nursing note reviewed.  Constitutional:      General: She is not in acute distress.    Appearance: Normal appearance. She is well-developed.  HENT:     Head: Normocephalic and atraumatic.  Eyes:     Conjunctiva/sclera: Conjunctivae normal.  Cardiovascular:     Rate and Rhythm: Normal rate and regular rhythm.     Heart sounds: No murmur heard.   Pulmonary:     Effort: Pulmonary effort is normal. No respiratory distress.     Breath sounds: Normal breath sounds.  Abdominal:     General: There is distension.     Palpations: Abdomen is soft.     Tenderness: There is  abdominal tenderness (generalized). There is no guarding or rebound.  Musculoskeletal:        General: No deformity or signs of injury. Normal range of motion.     Cervical back: Neck supple.  Skin:    General: Skin is warm and dry.  Neurological:     General: No focal deficit present.     Mental Status: She is alert.     ED Results / Procedures / Treatments   Labs (all labs ordered are listed, but only abnormal results are displayed) Labs Reviewed  COMPREHENSIVE METABOLIC PANEL - Abnormal; Notable for the following components:      Result Value   Glucose, Bld 114 (*)    All other components within normal limits  CBC - Abnormal; Notable for the following components:   RBC 5.29 (*)    MCH 25.7 (*)    All other components within normal limits  CBC - Abnormal; Notable for the following components:   WBC 3.7 (*)    MCH 25.8 (*)    All other components within normal limits  BASIC METABOLIC PANEL - Abnormal; Notable for the following components:   Glucose, Bld 104 (*)    All other  components within normal limits  LIPID PANEL - Abnormal; Notable for the following components:   Cholesterol 201 (*)    LDL Cholesterol 142 (*)    All other components within normal limits  RESP PANEL BY RT-PCR (FLU A&B, COVID) ARPGX2  LIPASE, BLOOD  URINALYSIS, ROUTINE W REFLEX MICROSCOPIC  HIV ANTIBODY (ROUTINE TESTING W REFLEX)    EKG None  Radiology CT Abdomen Pelvis W Contrast  Result Date: 03/04/2021 CLINICAL DATA:  Mid abdominal pain and nausea. EXAM: CT ABDOMEN AND PELVIS WITH CONTRAST TECHNIQUE: Multidetector CT imaging of the abdomen and pelvis was performed using the standard protocol following bolus administration of intravenous contrast. CONTRAST:  183mL OMNIPAQUE IOHEXOL 300 MG/ML  SOLN COMPARISON:  September 30, 2017 FINDINGS: Lower chest: No acute abnormality. Hepatobiliary: No focal liver abnormality is seen. No gallstones, gallbladder wall thickening, or biliary dilatation. Pancreas: Unremarkable. No pancreatic ductal dilatation or surrounding inflammatory changes. Spleen: Normal in size without focal abnormality. Adrenals/Urinary Tract: Adrenal glands are unremarkable. Kidneys are normal, without renal calculi, focal lesion, or hydronephrosis. Bladder is unremarkable. Stomach/Bowel: Normal appearance of the stomach. Nondistended small bowels with evidence of fecalization of the distal ileum the colon is decompressed. The appendix is not identified. Mild inflammatory changes along the distal cecum. Small amount of fluid in the right lower quadrant and in the pelvis. Vascular/Lymphatic: No significant vascular findings are present. No enlarged abdominal or pelvic lymph nodes. Reproductive: Status post hysterectomy. No adnexal masses. Other: No abdominal wall hernia or abnormality. Musculoskeletal: No acute or significant osseous findings. IMPRESSION: 1. Nondistended small bowels with evidence of mild mucosal thickening and fecalization of the distal ileum, with surrounding mild  inflammatory changes and small amount of free fluid in the right lower quadrant and pelvis. 2. The appendix is not identified. Has the patient had an appendectomy? 3. Differential diagnosis includes infectious or inflammatory enterocolitis involving predominantly the distal ileum. Alternatively, if the patient has not had an appendectomy, this could potentially represent ruptured appendicitis with reactive inflammatory changes of the adjacent bowel loops. No evidence of abscess formation. Please correlate clinically. 4. No evidence of acute abnormalities within the solid abdominal organs. Electronically Signed   By: Fidela Salisbury M.D.   On: 03/04/2021 19:58    Procedures Procedures   Medications Ordered in  ED Medications  0.9 %  sodium chloride infusion ( Intravenous New Bag/Given 03/05/21 0501)  ondansetron (ZOFRAN) injection 4 mg (has no administration in time range)  fentaNYL (SUBLIMAZE) injection 12.5 mcg (12.5 mcg Intravenous Given 03/05/21 0951)  heparin injection 5,000 Units (5,000 Units Subcutaneous Given 03/05/21 0501)  acetaminophen (TYLENOL) tablet 650 mg (has no administration in time range)    Or  acetaminophen (TYLENOL) suppository 650 mg (has no administration in time range)  piperacillin-tazobactam (ZOSYN) IVPB 3.375 g (3.375 g Intravenous New Bag/Given 03/05/21 0459)  fentaNYL (SUBLIMAZE) injection 50 mcg (50 mcg Intravenous Given 03/04/21 1712)  ondansetron (ZOFRAN) injection 4 mg (4 mg Intravenous Given 03/04/21 1712)  sodium chloride 0.9 % bolus 500 mL (0 mLs Intravenous Stopped 03/04/21 1949)  fentaNYL (SUBLIMAZE) injection 100 mcg (100 mcg Intravenous Given 03/04/21 1941)  iohexol (OMNIPAQUE) 300 MG/ML solution 80 mL (100 mLs Intravenous Contrast Given 03/04/21 1928)  piperacillin-tazobactam (ZOSYN) IVPB 3.375 g (0 g Intravenous Stopped 03/04/21 2131)  0.9 %  sodium chloride infusion ( Intravenous New Bag/Given 03/04/21 2041)  ondansetron (ZOFRAN) injection 4 mg (4 mg Intravenous  Given 03/04/21 2040)  fentaNYL (SUBLIMAZE) injection 100 mcg (100 mcg Intravenous Given 03/04/21 2040)    ED Course  I have reviewed the triage vital signs and the nursing notes.  Pertinent labs & imaging results that were available during my care of the patient were reviewed by me and considered in my medical decision making (see chart for details).  Clinical Course as of 03/05/21 1014  Thu Mar 04, 2021  2023 CT equivocal showing possible ruptured appendicitis versus inflammatory changes.  Discussed with general surgery Dr. Marcello Moores.  She recommends medical admission and IV antibiotics and she will consult. [MB]  2028 Dr. Marcello Moores reviewed the CT and call me back.  She said that she does not think this is ruptured appendicitis. [MB]  2158 Discussed with [place] Dr. Henrene Pastor who will have the team see her in the morning. [MB]    Clinical Course User Index [MB] Hayden Rasmussen, MD   MDM Rules/Calculators/A&P                         This patient complains of abdominal pain nausea vomiting; this involves an extensive number of treatment Options and is a complaint that carries with it a high risk of complications and Morbidity. The differential includes colitis, diverticulitis, cholelithiasis, cholecystitis, appendicitis, irritable bowel, obstruction  I ordered, reviewed and interpreted labs, which included CBC with normal white count normal hemoglobin, chemistries fairly normal other than mild elevation of glucose urinalysis unremarkable, Covid testing negative I ordered medication IV fluids IV pain and nausea medication, IV antibiotics I ordered imaging studies which included CT abdomen and pelvis and I independently    visualized and interpreted imaging which showed no infectious changes in right lower quadrant Previous records obtained and reviewed in epic, no recent admissions I consulted general surgery Dr. Marcello Moores, gastroenterology Dr. Henrene Pastor, [facility] hospitalist Dr. Marlowe Sax and discussed lab  and imaging findings  Critical Interventions: None  After the interventions stated above, I reevaluated the patient and found patient still to be symptomatically uncomfortable.  She is agreeable to admission to the hospital for further work-up.   Final Clinical Impression(s) / ED Diagnoses Final diagnoses:  Generalized abdominal pain    Rx / DC Orders ED Discharge Orders    None       Hayden Rasmussen, MD 03/05/21 1017

## 2021-03-04 NOTE — Progress Notes (Signed)
Pharmacy Antibiotic Note  Nicole Gilbert is a 60 y.o. female admitted on 03/04/2021 with intra-abdominal infection.  Pharmacy has been consulted for Zosyn dosing.  Plan: Zosyn 3.375g IV q8h (4 hour infusion).  No dose adjustments anticipated.  Pharmacy will sign off & monitor peripherally via electronic surveillance software for renal fxn changes or culture data.   Height: 5\' 1"  (154.9 cm) Weight: 72.6 kg (160 lb) IBW/kg (Calculated) : 47.8  Temp (24hrs), Avg:98.2 F (36.8 C), Min:97.9 F (36.6 C), Max:98.5 F (36.9 C)  Recent Labs  Lab 03/04/21 1719  WBC 6.4  CREATININE 0.76    Estimated Creatinine Clearance: 67.3 mL/min (by C-G formula based on SCr of 0.76 mg/dL).    Allergies  Allergen Reactions  . Morphine And Related Nausea And Vomiting  . Shellfish Allergy Hives and Itching     Thank you for allowing pharmacy to be a part of this patient's care.  Netta Cedars PharmD 03/04/2021 10:49 PM

## 2021-03-04 NOTE — Progress Notes (Signed)
SBAR Reviewed. Questions answered by Junior RN.

## 2021-03-04 NOTE — H&P (Signed)
History and Physical    CASHE GATT XTG:[id number] DOB: August 18, 1961 DOA: 03/04/2021  PCP: Berkley Harvey, NP Patient coming from: Home  Chief Complaint: Abdominal pain, nausea  HPI: Nicole Gilbert is a 60 y.o. female with medical history significant of anxiety, hyperlipidemia, chronic constipation, GERD, hemorrhoids, cesarean section x2, hysterectomy presenting with complaints of abdominal pain and nausea.  In the ED, no fever, tachycardia, or hypotension.  Labs showing WBC 6.4, hemoglobin 13.6, platelet count 282K.  Sodium 139, potassium 3.9, chloride 106, bicarb 25, BUN 11, creatinine 0.7, glucose 114.  Lipase and LFTs normal.  UA without signs of infection.  SARS-CoV-2 PCR test negative.  Influenza panel negative.  CT abdomen pelvis showing nondistended small bowels with evidence of mild mucosal thickening and fecalization of the distal ileum with surrounding mild inflammatory changes and small amount of free fluid in the right lower quadrant and pelvis.  Radiologist feels that the CT findings could suggest infectious or inflammatory enterocolitis involving predominantly the distal ileum versus possible ruptured appendicitis with reactive inflammatory changes of the adjacent bowel loops.  No CT evidence of abscess formation. Patient was given fentanyl, Zofran, Zosyn, and a 500 cc normal saline bolus.  ED physician discussed the case with general surgery and recommendation was to continue antibiotics and obtain GI consultation.  [facility] (Dr. Henrene Pastor) has been consulted.  Patient reports 1 week history of pain in her upper abdomen.  She has felt very nauseous and has been gagging.  This morning she had oatmeal for breakfast and about 15 minutes later her abdominal pain became significantly worse.  She is now experiencing generalized abdominal pain.  She has not been able to eat since then.  She was having chills at home but is not sure about fevers.  She has not had any episodes of diarrhea.  Reports  history of 2 C-sections and hysterectomy.  States about a week ago her primary care physician treated her with prednisone and amoxicillin for an upper respiratory tract infection and she feels that she has recovered.  She is fully vaccinated against COVID including booster shot.  Denies cough, shortness of breath, or chest pain.  Review of Systems:  All systems reviewed and apart from history of presenting illness, are negative.  Past Medical History:  Diagnosis Date  . Anxiety   . Chronic constipation   . GERD (gastroesophageal reflux disease)   . Hemorrhoids 2006  . Hyperlipemia   . Insomnia     Past Surgical History:  Procedure Laterality Date  . ABDOMINAL HYSTERECTOMY    . BIOPSY  06/25/2014   Dr. Rourk:malignant features are not identified  . Carpal tunnel right hand    . CESAREAN SECTION     x2  . COLONOSCOPY   05/19/2008   Rourk-Prominent internal hemorrhoids and anal papilla, otherwise normal appearing rectal mucosa / Colonic mucosa appeared normal except for melanosis coli  . COLONOSCOPY  08/22/2012   Rourk- hyperplastic polyps and tubular adenoma, query infectious colitis vs NSAID effect. Next TCS 08/2017.  Marland Kitchen ESOPHAGOGASTRODUODENOSCOPY   01/20/2005   Rourk- Normal-appearing esophagus, status post passage of a 6 French Maloney  dilator/ small HH/  Otherwise normal stomach  . ESOPHAGOGASTRODUODENOSCOPY N/A 02/06/2013   EVO:[id number] esophagus. Small hiatal hernia. Antral erosions: bx no h.pylori.  . ESOPHAGOGASTRODUODENOSCOPY N/A 06/25/2014   Dr. Myra Gianotti nodule-status post biopsy; otherwise, normalEGD. No endoscopic explanation for patient's symptoms  . HEMORRHOID SURGERY  06/30/2008   Dr Arnoldo Morale  . KNEE ARTHROSCOPY  2010, RIGHT KNEE      reports that she has never smoked. She has never used smokeless tobacco. She reports current alcohol use. She reports that she does not use drugs.  Allergies  Allergen Reactions  . Morphine And Related Nausea And Vomiting  .  Shellfish Allergy Hives and Itching    Family History  Problem Relation Age of Onset  . Diabetes Father   . Colon cancer Brother 22       (paternal half brother)  . Coronary artery disease Maternal Grandmother   . Lung cancer Other     Prior to Admission medications   Medication Sig Start Date End Date Taking? Authorizing Provider  cholecalciferol (VITAMIN D3) 25 MCG (1000 UNIT) tablet Take 2,000 Units by mouth daily.   Yes [provider]  ELDERBERRY PO Take 2 tablets by mouth daily.   Yes [provider]  multivitamin-iron-minerals-folic acid (CENTRUM) chewable tablet Chew 1 tablet by mouth daily.   Yes [provider]  psyllium (METAMUCIL) 58.6 % powder Take 1 packet by mouth daily.   Yes [provider]  vitamin B-12 (CYANOCOBALAMIN) 100 MCG tablet Take 200 mcg by mouth daily.   Yes [provider]  vitamin C (ASCORBIC ACID) 500 MG tablet Take 1,000 mg by mouth daily.   Yes [provider]  Camphor-Menthol-Methyl Sal 1.2-5.7-6.3 % PTCH Apply 1 patch topically daily. Patient not taking: Reported on 03/04/2021 09/30/17   Langston Masker B, PA-C  omeprazole (PRILOSEC) 20 MG capsule Take 1 capsule (20 mg total) by mouth daily. Patient not taking: No sig reported 11/22/16   Mahala Menghini, PA-C  polyethylene glycol-electrolytes (TRILYTE) 420 g solution Take 4,000 mLs by mouth as directed. Patient not taking: No sig reported 04/21/17   Daneil Dolin, MD    Physical Exam: Vitals:   03/04/21 2101 03/04/21 2130 03/04/21 2200 03/04/21 2220  BP:  121/84 126/71 137/76  Pulse: 79 66 68 65  Resp: 13 12 15 16   Temp:  98.5 F (36.9 C)  97.9 F (36.6 C)  TempSrc:  Oral  Oral  SpO2: 96% 98% 98% 99%  Weight:      Height:        Physical Exam Constitutional:      General: She is not in acute distress. HENT:     Head: Normocephalic and atraumatic.  Eyes:     Extraocular Movements: Extraocular movements intact.      Conjunctiva/sclera: Conjunctivae normal.  Cardiovascular:     Rate and Rhythm: Normal rate and regular rhythm.     Pulses: Normal pulses.  Pulmonary:     Effort: Pulmonary effort is normal. No respiratory distress.     Breath sounds: Normal breath sounds. No wheezing or rales.  Abdominal:     General: There is no distension.     Palpations: Abdomen is soft.     Tenderness: There is abdominal tenderness.     Comments: Hyperactive bowel sounds Generalized tenderness to palpation No guarding, rebound, or rigidity  Musculoskeletal:        General: No swelling or tenderness.     Cervical back: Normal range of motion and neck supple.  Skin:    General: Skin is warm and dry.  Neurological:     General: No focal deficit present.     Mental Status: She is alert and oriented to person, place, and time.     Labs on Admission: I have personally reviewed following labs and imaging studies  CBC: Recent  Labs  Lab 03/04/21 1719  WBC 6.4  HGB 13.6  HCT 42.6  MCV 80.5  PLT 633   Basic Metabolic Panel: Recent Labs  Lab 03/04/21 1719  NA 139  K 3.9  CL 106  CO2 25  GLUCOSE 114*  BUN 11  CREATININE 0.76  CALCIUM 9.8   GFR: Estimated Creatinine Clearance: 67.3 mL/min (by C-G formula based on SCr of 0.76 mg/dL). Liver Function Tests: Recent Labs  Lab 03/04/21 1719  AST 22  ALT 19  ALKPHOS 48  BILITOT 1.2  PROT 7.9  ALBUMIN 4.1   Recent Labs  Lab 03/04/21 1719  LIPASE 33   No results for input(s): AMMONIA in the last 168 hours. Coagulation Profile: No results for input(s): INR, PROTIME in the last 168 hours. Cardiac Enzymes: No results for input(s): CKTOTAL, CKMB, CKMBINDEX, TROPONINI in the last 168 hours. BNP (last 3 results) No results for input(s): PROBNP in the last 8760 hours. HbA1C: No results for input(s): HGBA1C in the last 72 hours. CBG: No results for input(s): GLUCAP in the last 168 hours. Lipid Profile: No results for input(s): CHOL, HDL, LDLCALC,  TRIG, CHOLHDL, LDLDIRECT in the last 72 hours. Thyroid Function Tests: No results for input(s): TSH, T4TOTAL, FREET4, T3FREE, THYROIDAB in the last 72 hours. Anemia Panel: No results for input(s): VITAMINB12, FOLATE, FERRITIN, TIBC, IRON, RETICCTPCT in the last 72 hours. Urine analysis:    Component Value Date/Time   COLORURINE YELLOW 03/04/2021 1720   APPEARANCEUR CLEAR 03/04/2021 1720   LABSPEC 1.018 03/04/2021 1720   PHURINE 5.0 03/04/2021 1720   GLUCOSEU NEGATIVE 03/04/2021 1720   HGBUR NEGATIVE 03/04/2021 1720   BILIRUBINUR NEGATIVE 03/04/2021 1720   KETONESUR NEGATIVE 03/04/2021 1720   PROTEINUR NEGATIVE 03/04/2021 1720   UROBILINOGEN 0.2 10/07/2015 0520   NITRITE NEGATIVE 03/04/2021 1720   LEUKOCYTESUR NEGATIVE 03/04/2021 1720    Radiological Exams on Admission: CT Abdomen Pelvis W Contrast  Result Date: 03/04/2021 CLINICAL DATA:  Mid abdominal pain and nausea. EXAM: CT ABDOMEN AND PELVIS WITH CONTRAST TECHNIQUE: Multidetector CT imaging of the abdomen and pelvis was performed using the standard protocol following bolus administration of intravenous contrast. CONTRAST:  167mL OMNIPAQUE IOHEXOL 300 MG/ML  SOLN COMPARISON:  September 30, 2017 FINDINGS: Lower chest: No acute abnormality. Hepatobiliary: No focal liver abnormality is seen. No gallstones, gallbladder wall thickening, or biliary dilatation. Pancreas: Unremarkable. No pancreatic ductal dilatation or surrounding inflammatory changes. Spleen: Normal in size without focal abnormality. Adrenals/Urinary Tract: Adrenal glands are unremarkable. Kidneys are normal, without renal calculi, focal lesion, or hydronephrosis. Bladder is unremarkable. Stomach/Bowel: Normal appearance of the stomach. Nondistended small bowels with evidence of fecalization of the distal ileum the colon is decompressed. The appendix is not identified. Mild inflammatory changes along the distal cecum. Small amount of fluid in the right lower quadrant and in the  pelvis. Vascular/Lymphatic: No significant vascular findings are present. No enlarged abdominal or pelvic lymph nodes. Reproductive: Status post hysterectomy. No adnexal masses. Other: No abdominal wall hernia or abnormality. Musculoskeletal: No acute or significant osseous findings. IMPRESSION: 1. Nondistended small bowels with evidence of mild mucosal thickening and fecalization of the distal ileum, with surrounding mild inflammatory changes and small amount of free fluid in the right lower quadrant and pelvis. 2. The appendix is not identified. Has the patient had an appendectomy? 3. Differential diagnosis includes infectious or inflammatory enterocolitis involving predominantly the distal ileum. Alternatively, if the patient has not had an appendectomy, this could potentially represent ruptured appendicitis with reactive  inflammatory changes of the adjacent bowel loops. No evidence of abscess formation. Please correlate clinically. 4. No evidence of acute abnormalities within the solid abdominal organs. Electronically Signed   By: Fidela Salisbury M.D.   On: 03/04/2021 19:58    EKG: No EKG done in the ED.  EKG has been ordered now and currently pending.  Assessment/Plan Principal Problem:   Abdominal pain Active Problems:   Enterocolitis   HLD (hyperlipidemia)   Acute abdominal pain suspect secondary to enterocolitis: Abdominal pain appears to be generalized.  Associated with nausea but no vomiting or diarrhea.  No fever, tachycardia, hypotension, or leukocytosis to suggest sepsis.  Lipase and LFTs normal. CT abdomen pelvis showing nondistended small bowels with evidence of mild mucosal thickening and fecalization of the distal ileum with surrounding mild inflammatory changes and small amount of free fluid in the right lower quadrant and pelvis.  Radiologist feels that the CT findings could suggest infectious or inflammatory enterocolitis involving predominantly the distal ileum versus possible  ruptured appendicitis with reactive inflammatory changes of the adjacent bowel loops.  No CT evidence of abscess formation.  She has generalized tenderness to palpation but exam not concerning for peritonitis.  I spoke to Dr. Marcello Moores from general surgery who reviewed the CT scan and does not feel that the CT findings reflect ruptured appendicitis.  She feels these findings are suspicious for Crohn's disease and recommends consulting gastroenterology. -Continue Zosyn, pain management, antiemetic as needed, and IV fluid hydration.  Continue to monitor WBC count.  Keep n.p.o. at this time.  [facility] (Dr. Henrene Pastor) has been consulted by ED physician.  Hyperlipidemia: Not on a statin. -Check lipid panel  DVT prophylaxis: Subcutaneous heparin Code Status: Full code Family Communication: Female at bedside who the patient introduced as her neighbor and friend. Disposition Plan: Status is: Inpatient  Remains inpatient appropriate because:Ongoing active pain requiring inpatient pain management, IV treatments appropriate due to intensity of illness or inability to take PO and Inpatient level of care appropriate due to severity of illness   Dispo: The patient is from: Home              Anticipated d/c is to: Home              Patient currently is not medically stable to d/c.   Difficult to place patient No  Level of care: Level of care: Telemetry   The medical decision making on this patient was of high complexity and the patient is at high risk for clinical deterioration, therefore this is a level 3 visit.  Shela Leff MD [facility]  If 7PM-7AM, please contact night-coverage [email or web address]  03/04/2021, 10:44 PM

## 2021-03-04 NOTE — ED Triage Notes (Signed)
Patient c/o mid abdominal pain and nausea since this AM.

## 2021-03-05 DIAGNOSIS — E6609 Other obesity due to excess calories: Secondary | ICD-10-CM

## 2021-03-05 DIAGNOSIS — R933 Abnormal findings on diagnostic imaging of other parts of digestive tract: Secondary | ICD-10-CM

## 2021-03-05 DIAGNOSIS — R1031 Right lower quadrant pain: Secondary | ICD-10-CM

## 2021-03-05 DIAGNOSIS — Z6830 Body mass index (BMI) 30.0-30.9, adult: Secondary | ICD-10-CM

## 2021-03-05 DIAGNOSIS — K529 Noninfective gastroenteritis and colitis, unspecified: Principal | ICD-10-CM

## 2021-03-05 LAB — CBC
HCT: 38.2 % (ref 36.0–46.0)
Hemoglobin: 12 g/dL (ref 12.0–15.0)
MCH: 25.8 pg — ABNORMAL LOW (ref 26.0–34.0)
MCHC: 31.4 g/dL (ref 30.0–36.0)
MCV: 82 fL (ref 80.0–100.0)
Platelets: 249 10*3/uL (ref 150–400)
RBC: 4.66 MIL/uL (ref 3.87–5.11)
RDW: 14.9 % (ref 11.5–15.5)
WBC: 3.7 10*3/uL — ABNORMAL LOW (ref 4.0–10.5)
nRBC: 0 % (ref 0.0–0.2)

## 2021-03-05 LAB — LIPID PANEL
Cholesterol: 201 mg/dL — ABNORMAL HIGH (ref 0–200)
HDL: 50 mg/dL (ref >40)
LDL Cholesterol: 142 mg/dL — ABNORMAL HIGH (ref 0–99)
Total CHOL/HDL Ratio: 4 RATIO
Triglycerides: 44 mg/dL (ref <150)
VLDL: 9 mg/dL (ref 0–40)

## 2021-03-05 LAB — BASIC METABOLIC PANEL
Anion gap: 6 (ref 5–15)
BUN: 11 mg/dL (ref 8–23)
CO2: 27 mmol/L (ref 22–32)
Calcium: 9.3 mg/dL (ref 8.9–10.3)
Chloride: 107 mmol/L (ref 98–111)
Creatinine, Ser: 0.77 mg/dL (ref 0.44–1.00)
GFR, Estimated: >60 mL/min (ref >60)
Glucose, Bld: 104 mg/dL — ABNORMAL HIGH (ref 70–99)
Potassium: 4.3 mmol/L (ref 3.5–5.1)
Sodium: 140 mmol/L (ref 135–145)

## 2021-03-05 LAB — SEDIMENTATION RATE: Sed Rate: 15 mm/hr (ref 0–22)

## 2021-03-05 LAB — LACTIC ACID, PLASMA
Lactic Acid, Venous: 0.7 mmol/L (ref 0.5–1.9)
Lactic Acid, Venous: 0.7 mmol/L (ref 0.5–1.9)

## 2021-03-05 LAB — C-REACTIVE PROTEIN: CRP: 1 mg/dL — ABNORMAL HIGH (ref <1.0)

## 2021-03-05 LAB — HIV ANTIBODY (ROUTINE TESTING W REFLEX): HIV Screen 4th Generation wRfx: NONREACTIVE

## 2021-03-05 MED ORDER — SODIUM CHLORIDE 0.9 % IV SOLN: INTRAVENOUS | Status: DC

## 2021-03-05 MED ORDER — LIP MEDEX EX OINT
TOPICAL_OINTMENT | CUTANEOUS | Status: AC
  Filled 2021-03-05: qty 7

## 2021-03-05 MED ORDER — FENTANYL CITRATE (PF) 100 MCG/2ML IJ SOLN: 25.0000 ug | INTRAMUSCULAR | Status: DC | PRN

## 2021-03-05 MED ORDER — SODIUM CHLORIDE 0.9 % IV SOLN: INTRAVENOUS | Status: AC

## 2021-03-05 NOTE — Progress Notes (Signed)
PROGRESS NOTE  Nicole Gilbert XAJ:[id number] DOB: Apr 09, 1961   PCP: Berkley Harvey, NP  Patient is from: Home  DOA: 03/04/2021 LOS: 1  Chief complaints: Abdominal pain, nausea and chills  Brief Narrative / Interim history: 60 year old F with PMH of anxiety, chronic constipation, hemorrhoids, GERD, HLD, cesarean section x2 and hysterectomy presenting with nausea, abdominal pain and chills, and admitted with working diagnosis of enterocolitis with unclear etiology.  Vital signs basically normal.  CBC, CMP and UA without significant finding.  She was a started on IV fluid, IV Zosyn, analgesics and antiemetics.  GI and general surgery consulted, and following.  Patient has no diarrhea.  Subjective: Seen and examined earlier this morning.  No major events overnight or this morning.  Reports improvement in her pain after pain medication.  She rates her pain 4/10.  She describes it as cramping.  Pain is on and off.  She says pain is worse with movement.  LBM yesterday about noon.  Denies melena or hematochezia.  Denies fever, chest pain, shortness of breath or lightheadedness.  Denies personal or family history of IBD.  Reports half brother with colon cancer in 91s.  She said she had some polyps on colonoscopy some years back.  Objective: Vitals:   03/04/21 2200 03/04/21 2220 03/05/21 0200 03/05/21 1011  BP: 126/71 137/76 119/74 125/86  Pulse: 68 65 70 67  Resp: _0 Temp:  97.9 F (36.6 C) 98 F (36.7 C) 98.1 F (36.7 C)  TempSrc:  Oral Oral Oral  SpO2: 98% 99% 95% 98%  Weight:      Height:        Intake/Output Summary (Last 24 hours) at 03/05/2021 1220 Last data filed at 03/05/2021 0900 Gross per 24 hour  Intake 1318.13 ml  Output --  Net 1318.13 ml   Filed Weights   03/04/21 1628  Weight: 72.6 kg    Examination:  GENERAL: No apparent distress.  Nontoxic. HEENT: MMM.  Vision and hearing grossly intact.  NECK: Supple.  No apparent JVD.  RESP:  No IWOB.  Fair aeration  bilaterally. CVS:  RRR. Heart sounds normal.  ABD/GI/GU: BS+. Abd soft.  RLQ tenderness with rebound.  No guarding. MSK/EXT:  Moves extremities. No apparent deformity. No edema.  SKIN: no apparent skin lesion or wound NEURO: Awake, alert and oriented appropriately.  No apparent focal neuro deficit. PSYCH: Calm. Normal affect.  Procedures:  None  Microbiology summarized: CNOBS-96 and influenza PCR nonreactive.  Assessment & Plan: Acute abdominal pain/enterocolitis: unclear etiology.  CT abdomen and pelvis showed mild mucosal thickening and fecalization of the distal ileum with surrounding mild inflammatory change and small amount of free fluid in the RLQ and pelvis raising concern for enterocolitis versus possible ruptured appendicitis.  She has RLQ tenderness with rebound on my exam.  However, she has no fever, leukocytosis or concerning labs to suggest infectious process. No personal or family history of IBD.  Reportedly few polyps resected when she had a colonoscopy.  Abdominal pain improving.  Appears well. -Consulted general surgery-appreciate input and guidance. -GI consulted-will see patient -Continue IV fluid, bowel rest, analgesics and antiemetics. -Check lactic acid, CRP and ESR  History of chronic constipation-uses psyllium -We will continue once she start taking p.o.  Hyperlipidemia?  LDL 142.  ASCVD risk score 5.5% -Encourage lifestyle change  Class I obesity Body mass index is 30.23 kg/m.  -Encourage lifestyle change to lose weight       DVT prophylaxis:  heparin  injection 5,000 Units Start: 03/05/21 0600  Code Status: Full code Family Communication: Patient and/or RN. Available if any question.  Level of care: Telemetry Status is: Inpatient  Remains inpatient appropriate because:Ongoing diagnostic testing needed not appropriate for outpatient work up, IV treatments appropriate due to intensity of illness or inability to take PO and Inpatient level of care  appropriate due to severity of illness   Dispo: The patient is from: Home              Anticipated d/c is to: Home              Patient currently is not medically stable to d/c.   Difficult to place patient No       Consultants:  General surgery Gastroenterology   Sch Meds:  Scheduled Meds: . heparin  5,000 Units Subcutaneous Q8H   Continuous Infusions: . sodium chloride    . piperacillin-tazobactam (ZOSYN)  IV 3.375 g (03/05/21 1144)   PRN Meds:.acetaminophen **OR** acetaminophen, fentaNYL (SUBLIMAZE) injection, ondansetron (ZOFRAN) IV  Antimicrobials: Anti-infectives (From admission, onward)   Start     Dose/Rate Route Frequency Ordered Stop   03/05/21 0400  piperacillin-tazobactam (ZOSYN) IVPB 3.375 g        3.375 g 12.5 mL/hr over 240 Minutes Intravenous Every 8 hours 03/04/21 2248     03/04/21 2030  piperacillin-tazobactam (ZOSYN) IVPB 3.375 g        3.375 g 100 mL/hr over 30 Minutes Intravenous  Once 03/04/21 2026 03/04/21 2131       I have personally reviewed the following labs and images: CBC: Recent Labs  Lab 03/04/21 1719 03/05/21 0329  WBC 6.4 3.7*  HGB 13.6 12.0  HCT 42.6 38.2  MCV 80.5 82.0  PLT 282 249   BMP &GFR Recent Labs  Lab 03/04/21 1719 03/05/21 0329  NA 139 140  K 3.9 4.3  CL 106 107  CO2 25 27  GLUCOSE 114* 104*  BUN 11 11  CREATININE 0.76 0.77  CALCIUM 9.8 9.3   Estimated Creatinine Clearance: 67.3 mL/min (by C-G formula based on SCr of 0.77 mg/dL). Liver & Pancreas: Recent Labs  Lab 03/04/21 1719  AST 22  ALT 19  ALKPHOS 48  BILITOT 1.2  PROT 7.9  ALBUMIN 4.1   Recent Labs  Lab 03/04/21 1719  LIPASE 33   No results for input(s): AMMONIA in the last 168 hours. Diabetic: No results for input(s): HGBA1C in the last 72 hours. No results for input(s): GLUCAP in the last 168 hours. Cardiac Enzymes: No results for input(s): CKTOTAL, CKMB, CKMBINDEX, TROPONINI in the last 168 hours. No results for input(s):  PROBNP in the last 8760 hours. Coagulation Profile: No results for input(s): INR, PROTIME in the last 168 hours. Thyroid Function Tests: No results for input(s): TSH, T4TOTAL, FREET4, T3FREE, THYROIDAB in the last 72 hours. Lipid Profile: Recent Labs    03/05/21 0329  CHOL 201*  HDL 50  LDLCALC 142*  TRIG 44  CHOLHDL 4.0   Anemia Panel: No results for input(s): VITAMINB12, FOLATE, FERRITIN, TIBC, IRON, RETICCTPCT in the last 72 hours. Urine analysis:    Component Value Date/Time   COLORURINE YELLOW 03/04/2021 [place] 03/04/2021 1720   LABSPEC 1.018 03/04/2021 1720   PHURINE 5.0 03/04/2021 1720   GLUCOSEU NEGATIVE 03/04/2021 1720   HGBUR NEGATIVE 03/04/2021 1720   BILIRUBINUR NEGATIVE 03/04/2021 1720   KETONESUR NEGATIVE 03/04/2021 1720   PROTEINUR NEGATIVE 03/04/2021 1720   UROBILINOGEN 0.2 10/07/2015 0520  NITRITE NEGATIVE 03/04/2021 1720   LEUKOCYTESUR NEGATIVE 03/04/2021 1720   Sepsis Labs: Invalid input(s): PROCALCITONIN, [place]  Microbiology: Recent Results (from the past 240 hour(s))  Resp Panel by RT-PCR (Flu A&B, Covid) Nasopharyngeal Swab     Status: None   Collection Time: 03/04/21  8:25 PM   Specimen: Nasopharyngeal Swab; Nasopharyngeal(NP) swabs in vial transport medium  Result Value Ref Range Status   SARS Coronavirus 2 by RT PCR NEGATIVE NEGATIVE Final    Comment: (NOTE) SARS-CoV-2 target nucleic acids are NOT DETECTED.  The SARS-CoV-2 RNA is generally detectable in upper respiratory specimens during the acute phase of infection. The lowest concentration of SARS-CoV-2 viral copies this assay can detect is 138 copies/mL. A negative result does not preclude SARS-Cov-2 infection and should not be used as the sole basis for treatment or other patient management decisions. A negative result may occur with  improper specimen collection/handling, submission of specimen other than nasopharyngeal swab, presence of viral mutation(s)  within the areas targeted by this assay, and inadequate number of viral copies(<138 copies/mL). A negative result must be combined with clinical observations, patient history, and epidemiological information. The expected result is Negative.  Fact Sheet for Patients:  EntrepreneurPulse.com.au  Fact Sheet for Healthcare Providers:  IncredibleEmployment.be  This test is no t yet approved or cleared by the [place] FDA and  has been authorized for detection and/or diagnosis of SARS-CoV-2 by FDA under an Emergency Use Authorization (EUA). This EUA will remain  in effect (meaning this test can be used) for the duration of the COVID-19 declaration under Section 564(b)(1) of the Act, 21 U.S.C.section 360bbb-3(b)(1), unless the authorization is terminated  or revoked sooner.       Influenza A by PCR NEGATIVE NEGATIVE Final   Influenza B by PCR NEGATIVE NEGATIVE Final    Comment: (NOTE) The Xpert Xpress SARS-CoV-2/FLU/RSV plus assay is intended as an aid in the diagnosis of influenza from Nasopharyngeal swab specimens and should not be used as a sole basis for treatment. Nasal washings and aspirates are unacceptable for Xpert Xpress SARS-CoV-2/FLU/RSV testing.  Fact Sheet for Patients: EntrepreneurPulse.com.au  Fact Sheet for Healthcare Providers: IncredibleEmployment.be  This test is not yet approved or cleared by the [place] FDA and has been authorized for detection and/or diagnosis of SARS-CoV-2 by FDA under an Emergency Use Authorization (EUA). This EUA will remain in effect (meaning this test can be used) for the duration of the COVID-19 declaration under Section 564(b)(1) of the Act, 21 U.S.C. section 360bbb-3(b)(1), unless the authorization is terminated or revoked.  Performed at [facility], [place] [address], [place], [place] [address]     Radiology Studies: CT Abdomen  Pelvis W Contrast  Result Date: 03/04/2021 CLINICAL DATA:  Mid abdominal pain and nausea. EXAM: CT ABDOMEN AND PELVIS WITH CONTRAST TECHNIQUE: Multidetector CT imaging of the abdomen and pelvis was performed using the standard protocol following bolus administration of intravenous contrast. CONTRAST:  167m OMNIPAQUE IOHEXOL 300 MG/ML  SOLN COMPARISON:  September 30, 2017 FINDINGS: Lower chest: No acute abnormality. Hepatobiliary: No focal liver abnormality is seen. No gallstones, gallbladder wall thickening, or biliary dilatation. Pancreas: Unremarkable. No pancreatic ductal dilatation or surrounding inflammatory changes. Spleen: Normal in size without focal abnormality. Adrenals/Urinary Tract: Adrenal glands are unremarkable. Kidneys are normal, without renal calculi, focal lesion, or hydronephrosis. Bladder is unremarkable. Stomach/Bowel: Normal appearance of the stomach. Nondistended small bowels with evidence of fecalization of the distal ileum the colon is decompressed. The appendix is not  identified. Mild inflammatory changes along the distal cecum. Small amount of fluid in the right lower quadrant and in the pelvis. Vascular/Lymphatic: No significant vascular findings are present. No enlarged abdominal or pelvic lymph nodes. Reproductive: Status post hysterectomy. No adnexal masses. Other: No abdominal wall hernia or abnormality. Musculoskeletal: No acute or significant osseous findings. IMPRESSION: 1. Nondistended small bowels with evidence of mild mucosal thickening and fecalization of the distal ileum, with surrounding mild inflammatory changes and small amount of free fluid in the right lower quadrant and pelvis. 2. The appendix is not identified. Has the patient had an appendectomy? 3. Differential diagnosis includes infectious or inflammatory enterocolitis involving predominantly the distal ileum. Alternatively, if the patient has not had an appendectomy, this could potentially represent ruptured  appendicitis with reactive inflammatory changes of the adjacent bowel loops. No evidence of abscess formation. Please correlate clinically. 4. No evidence of acute abnormalities within the solid abdominal organs. Electronically Signed   By: Fidela Salisbury M.D.   On: 03/04/2021 19:58      Chenay Nesmith T. [place]  If 7PM-7AM, please contact night-coverage [email or web address] 03/05/2021, 12:20 PM

## 2021-03-05 NOTE — Consult Note (Addendum)
Attending physician's note   I have taken an interval history, reviewed the chart and examined the patient. I agree with the Advanced Practitioner's note, impression, and recommendations as outlined.   60 year old female admitted with 3-week history of vague abdominal symptoms then acute onset, progressive RLQ pain.  No prior similar symptoms.  No associated diarrhea, fever, chills, hematochezia, melena.  Last colonoscopy was in 2013 by Dr. Gala Romney and notable for hyperplastic polyps and tubular adenoma.  There was a slight granular appearance to the colon throughout, but biopsies were benign.  Normal TI.  Recommended 5-year repeat.  EGD in 02/2013 with some mild antral erosions, otherwise unremarkable.  Repeat EGD in 06/2014 with benign esophageal nodule and otherwise unremarkable.  Admission notable for the following: -WBC 6.4, H/H 13.6/42.6 -Normal BMP -Normal ESR, CRP, lactate -CT abdomen/pelvis: Nondistended small bowels with fecalization of the distal ileum.  Colon decompressed and appendix not identified with mild inflammatory changes along the distal cecum.  DDx includes infectious or inflammatory enterocolitis predominantly distal ileum.  Alternatively could represent ruptured appendicitis with reactive inflammatory changes.  General surgery service consulted due to concern for possible ruptured appendicitis.  Current plan for conservative management with bowel rest, IVF, empiric antibiotics but also discussed the possibility of diagnostic laparoscopy and possible laparotomy if clinically worsens.  Nicole Gilbert states her pain has improved since hospital admission, but not resolved.  1) RLQ pain 2) Inflammatory changes of ileum/cecum on CT  Discussed DDx with patient today, which certainly includes Crohn's Disease (although normal inflammatory markers, no change in bowel habits), atypical appendicitis (per surgery, and usual timeline and CT), infectious etiology, etc. agree with current plan for  conservative management.  -Continue empiric antibiotics -Continue IV fluids -Continue pain control per primary Hospitalist service -Frequent abdominal exams -Will eventually need colonoscopy with timing depending on how Nicole Gilbert does clinically -GI service will continue to follow  Gerrit Heck, DO, FACG [phone] office            Referring Provider:  [facility] Primary Care Physician:  Berkley Harvey, NP Primary Gastroenterologist:  Dr. Gala Romney  Reason for Consultation:  Abdominal pain and nausea, abnormal CT scan  HPI: Nicole Gilbert is a 60 y.o. female with really no significant PMH who was admitted to [facility] last night with complaints of abdominal pain.  Nicole Gilbert reports a 2-3 week h/o of mild upper abdominal discomfort and somewhat decreased appetite.  Then yesterday after eating some oatmeal and lying down Nicole Gilbert developed RLQ abdominal pain.  Describes the pain as crampy/twisting.  Pain worsened and was associated with nausea and gagging so Nicole Gilbert came to the ED. Nicole Gilbert had a normal BM yesterday, and is passing flatus today. Nicole Gilbert did vomit multiple times while in the ED. States that Nicole Gilbert has never had pain like this before.  Labs were unremarkable.  CT scan of the abdomen and pelvis with contrast showed the following:  IMPRESSION: 1. Nondistended small bowels with evidence of mild mucosal thickening and fecalization of the distal ileum, with surrounding mild inflammatory changes and small amount of free fluid in the right lower quadrant and pelvis. 2. The appendix is not identified. Has the patient had an appendectomy? 3. Differential diagnosis includes infectious or inflammatory enterocolitis involving predominantly the distal ileum. Alternatively, if the patient has not had an appendectomy, this could potentially represent ruptured appendicitis with reactive inflammatory changes of the adjacent bowel loops. No evidence of abscess formation. Please correlate clinically. 4. No evidence of acute  abnormalities within the solid  abdominal organs.  Last colonoscopy 2013 by Dr. Gala Romney in [place] - hyperplastic polyps and tubular adenoma.  Query infectious colitis versus NSAID effect on colonoscopy report, but biopsies were all unremarkable.  Was supposed to have another colonoscopy in 2018.  Past Medical History:  Diagnosis Date  . Anxiety   . Chronic constipation   . GERD (gastroesophageal reflux disease)   . Hemorrhoids 2006  . Hyperlipemia   . Insomnia     Past Surgical History:  Procedure Laterality Date  . ABDOMINAL HYSTERECTOMY    . BIOPSY  06/25/2014   Dr. Rourk:malignant features are not identified  . Carpal tunnel right hand    . CESAREAN SECTION     x2  . COLONOSCOPY   05/19/2008   Rourk-Prominent internal hemorrhoids and anal papilla, otherwise normal appearing rectal mucosa / Colonic mucosa appeared normal except for melanosis coli  . COLONOSCOPY  08/22/2012   Rourk- hyperplastic polyps and tubular adenoma, query infectious colitis vs NSAID effect. Next TCS 08/2017.  Marland Kitchen ESOPHAGOGASTRODUODENOSCOPY   01/20/2005   Rourk- Normal-appearing esophagus, status post passage of a 39 French Maloney  dilator/ small HH/  Otherwise normal stomach  . ESOPHAGOGASTRODUODENOSCOPY N/A 02/06/2013   MBT:DHRCBU esophagus. Small hiatal hernia. Antral erosions: bx no h.pylori.  . ESOPHAGOGASTRODUODENOSCOPY N/A 06/25/2014   Dr. Myra Gianotti nodule-status post biopsy; otherwise, normalEGD. No endoscopic explanation for patient's symptoms  . HEMORRHOID SURGERY  06/30/2008   Dr Arnoldo Morale  . KNEE ARTHROSCOPY     2010, RIGHT KNEE     Prior to Admission medications   Medication Sig Start Date End Date Taking? Authorizing Provider  cholecalciferol (VITAMIN D3) 25 MCG (1000 UNIT) tablet Take 2,000 Units by mouth daily.   Yes [provider]  ELDERBERRY PO Take 2 tablets by mouth daily.   Yes [provider]  multivitamin-iron-minerals-folic acid (CENTRUM) chewable tablet Chew  1 tablet by mouth daily.   Yes [provider]  psyllium (METAMUCIL) 58.6 % powder Take 1 packet by mouth daily.   Yes [provider]  vitamin B-12 (CYANOCOBALAMIN) 100 MCG tablet Take 200 mcg by mouth daily.   Yes [provider]  vitamin C (ASCORBIC ACID) 500 MG tablet Take 1,000 mg by mouth daily.   Yes [provider]  Camphor-Menthol-Methyl Sal 1.2-5.7-6.3 % PTCH Apply 1 patch topically daily. Patient not taking: Reported on 03/04/2021 09/30/17   Langston Masker B, PA-C  omeprazole (PRILOSEC) 20 MG capsule Take 1 capsule (20 mg total) by mouth daily. Patient not taking: No sig reported 11/22/16   Mahala Menghini, PA-C  polyethylene glycol-electrolytes (TRILYTE) 420 g solution Take 4,000 mLs by mouth as directed. Patient not taking: No sig reported 04/21/17   Rourk, Cristopher Estimable, MD    Current Facility-Administered Medications  Medication Dose Route Frequency Provider Last Rate Last Admin  . 0.9 %  sodium chloride infusion   Intravenous Continuous Wendee Beavers T, MD 100 mL/hr at 03/05/21 1313 New Bag at 03/05/21 1313  . acetaminophen (TYLENOL) tablet 650 mg  650 mg Oral Q6H PRN Shela Leff, MD       Or  . acetaminophen (TYLENOL) suppository 650 mg  650 mg Rectal Q6H PRN Shela Leff, MD   650 mg at 03/05/21 1149  . fentaNYL (SUBLIMAZE) injection 12.5 mcg  12.5 mcg Intravenous Q2H PRN Shela Leff, MD   12.5 mcg at 03/05/21 0951  . heparin injection 5,000 Units  5,000 Units Subcutaneous Q8H Shela Leff, MD   5,000 Units at 03/05/21 1314  .  ondansetron (ZOFRAN) injection 4 mg  4 mg Intravenous Q6H PRN Shela Leff, MD      . piperacillin-tazobactam (ZOSYN) IVPB 3.375 g  3.375 g Intravenous Q8H Thomes Lolling, RPH 12.5 mL/hr at 03/05/21 1144 3.375 g at 03/05/21 1144    Allergies as of 03/04/2021 - Review Complete 03/04/2021  Allergen Reaction Noted  . Morphine and related Nausea And Vomiting 11/16/2016  . Shellfish allergy  Hives and Itching 08/13/2012    Family History  Problem Relation Age of Onset  . Diabetes Father   . Colon cancer Brother 31       (paternal half brother)  . Coronary artery disease Maternal Grandmother   . Lung cancer Other     Social History   Socioeconomic History  . Marital status: Single    Spouse name: Not on file  . Number of children: 2  . Years of education: Not on file  . Highest education level: Not on file  Occupational History  . Occupation: Psychologist, counselling, [employer]    Employer: [employer]  Tobacco Use  . Smoking status: Never Smoker  . Smokeless tobacco: Never Used  Vaping Use  . Vaping Use: Some days  . Substances: Nicotine, Flavoring  Substance and Sexual Activity  . Alcohol use: Yes    Comment: rare  . Drug use: No  . Sexual activity: Yes    Birth control/protection: None  Other Topics Concern  . Not on file  Social History Narrative  . Not on file   Social Determinants of Health   Financial Resource Strain: Not on file  Food Insecurity: Not on file  Transportation Needs: Not on file  Physical Activity: Not on file  Stress: Not on file  Social Connections: Not on file  Intimate Partner Violence: Not on file    Review of Systems: ROS is O/W negative except as mentioned in HPI.  Physical Exam: Vital signs in last 24 hours: Temp:  [97.9 F (36.6 C)-98.5 F (36.9 C)] 98.1 F (36.7 C) (04/01 1011) Pulse Rate:  [65-85] 69 (04/01 1250) Resp:  [11-18] 17 (04/01 1250) BP: (108-181)/(71-109) 125/75 (04/01 1250) SpO2:  [88 %-100 %] 98 % (04/01 1250) Weight:  [72.6 kg] 72.6 kg (03/31 1628) Last BM Date: 03/04/21 General:  Alert, Well-developed, well-nourished, pleasant and cooperative in NAD Head:  Normocephalic and atraumatic. Eyes:  Sclera clear, no icterus.  Conjunctiva pink. Ears:  Normal auditory acuity. Mouth:  No deformity or lesions.  Lungs:  Clear throughout to auscultation.  No wheezes, crackles, or rhonchi.  Heart:   Regular rate and rhythm; no murmurs, clicks, rubs, or gallops. Abdomen:  Soft, non-distended.  BS present.  Lower abdominal TTP more so on the right.   Msk:  Symmetrical without gross deformities. Pulses:  Normal pulses noted. Extremities:  Without clubbing or edema. Neurologic:  Alert and oriented x 4;  grossly normal neurologically. Skin:  Intact without significant lesions or rashes. Psych:  Alert and cooperative. Normal mood and affect.  Intake/Output from previous day: 03/31 0701 - 04/01 0700 In: 1318.1 [I.V.:718.1; IV Piggyback:600] Out: -   Lab Results: Recent Labs    03/04/21 1719 03/05/21 0329  WBC 6.4 3.7*  HGB 13.6 12.0  HCT 42.6 38.2  PLT 282 249   BMET Recent Labs    03/04/21 1719 03/05/21 0329  NA 139 140  K 3.9 4.3  CL 106 107  CO2 25 27  GLUCOSE 114* 104*  BUN 11 11  CREATININE 0.76 0.77  CALCIUM 9.8 9.3   LFT Recent Labs    03/04/21 1719  PROT 7.9  ALBUMIN 4.1  AST 22  ALT 19  ALKPHOS 48  BILITOT 1.2   Studies/Results: CT Abdomen Pelvis W Contrast  Result Date: 03/04/2021 CLINICAL DATA:  Mid abdominal pain and nausea. EXAM: CT ABDOMEN AND PELVIS WITH CONTRAST TECHNIQUE: Multidetector CT imaging of the abdomen and pelvis was performed using the standard protocol following bolus administration of intravenous contrast. CONTRAST:  181m OMNIPAQUE IOHEXOL 300 MG/ML  SOLN COMPARISON:  September 30, 2017 FINDINGS: Lower chest: No acute abnormality. Hepatobiliary: No focal liver abnormality is seen. No gallstones, gallbladder wall thickening, or biliary dilatation. Pancreas: Unremarkable. No pancreatic ductal dilatation or surrounding inflammatory changes. Spleen: Normal in size without focal abnormality. Adrenals/Urinary Tract: Adrenal glands are unremarkable. Kidneys are normal, without renal calculi, focal lesion, or hydronephrosis. Bladder is unremarkable. Stomach/Bowel: Normal appearance of the stomach. Nondistended small bowels with evidence of  fecalization of the distal ileum the colon is decompressed. The appendix is not identified. Mild inflammatory changes along the distal cecum. Small amount of fluid in the right lower quadrant and in the pelvis. Vascular/Lymphatic: No significant vascular findings are present. No enlarged abdominal or pelvic lymph nodes. Reproductive: Status post hysterectomy. No adnexal masses. Other: No abdominal wall hernia or abnormality. Musculoskeletal: No acute or significant osseous findings. IMPRESSION: 1. Nondistended small bowels with evidence of mild mucosal thickening and fecalization of the distal ileum, with surrounding mild inflammatory changes and small amount of free fluid in the right lower quadrant and pelvis. 2. The appendix is not identified. Has the patient had an appendectomy? 3. Differential diagnosis includes infectious or inflammatory enterocolitis involving predominantly the distal ileum. Alternatively, if the patient has not had an appendectomy, this could potentially represent ruptured appendicitis with reactive inflammatory changes of the adjacent bowel loops. No evidence of abscess formation. Please correlate clinically. 4. No evidence of acute abnormalities within the solid abdominal organs. Electronically Signed   By: DFidela SalisburyM.D.   On: 03/04/2021 19:58   IMPRESSION:  *60year old female who complaints of abdominal pain and nausea with CT scan showing mild thickening and inflammatory changes of the distal ileum/enterocolitis.  ? Infectious or inflammatory in nature.  Reports some non-specific GI discomfort and decreased appetite for a couple of weeks but sudden onset of this pain and nausea yesterday.  CRP is 1.  Sed rate is normal.  Nicole Gilbert is on Zosyn empirically.  PLAN: -Supportive care with pain control, antiemetics, IVFs.  Will allow clears, but Nicole Gilbert just wants to sip on some stuff from the nursing floor for now.  Not sure if ongoing Zosyn is needed.  JLaban Emperor Zehr  03/05/2021,  1:17 PM

## 2021-03-05 NOTE — Consult Note (Addendum)
[facility] Surgery Consult Note  Nicole Gilbert 11/27/1961  [id number].    Requesting MD: Baltazar Apo Chief Complaint/Reason for Consult: RLQ Pain  HPI:  Nicole Gilbert is a 60yo female with no significant PMH who was admitted to [facility] last night with acute worsening abdominal pain.  She reports a 3 week h/o mild upper abdominal pain. States that the pain was minimal and she just ignored it, but then yesterday after eating some oatmeal the pain moved to her RLQ and became more severe. Describes the pain as crampy and intermittent. Worse with palpation. Associated with nausea and gagging. She had a normal BM yesterday, and is passing flatus today. She did vomit multiple times while in the ED. States that she has never had pain like this before. Denies fever, chills, diarrhea, dysuria, blood in stool.  Upon presentation in the ED her BP was found to be elevated, otherwise VSS. Labwork fairly unremarkable.  CT scan shows nondistended small bowels with evidence of mild mucosal thickening and fecalization of the distal ileum with surrounding mild inflammatory changes and small amount of free fluid in the right lower quadrant and pelvis; the appendix is not identified; possible infectious or inflammatory enterocolitis involving predominantly the distal ileum; radiology also questions possible ruptured appendicitis; no abscess. Patient was admitted to the medical service and started on IV zosyn, bowel rest. Gastroenterology was called and their consult is pending.  Concern for acute abdomen on exam this morning therefore general surgery was asked to see. She states that overall her symptoms have slightly improved this morning. No further emesis. She does still have significant RLQ abdominal pain, requiring pain medication. Passing flatus. Her vital signs are stable.  -Abdominal surgical history: c-section x2, hysterectomy -Last colonoscopy 2013 by Dr. Gala Romney in [place] - hyperplastic polyps and tubular  adenoma query infectious colitis versus NSAID effect and recommended next colonoscopy September 2018 -FH: 1/2 brother with colon cancer; no FH of IBD -Anticoagulants: none -Nonsmoker -Drinks alcohol occasionally -Denies illicit drug use -Employment: CNA  Review of Systems  Constitutional: Negative.   Respiratory: Negative.   Cardiovascular: Negative.   Gastrointestinal: Positive for abdominal pain, constipation, nausea and vomiting. Negative for blood in stool, diarrhea and melena.  Genitourinary: Negative.   Musculoskeletal: Positive for back pain.   All systems reviewed and otherwise negative except for as above  Family History  Problem Relation Age of Onset  . Diabetes Father   . Colon cancer Brother 66       (paternal half brother)  . Coronary artery disease Maternal Grandmother   . Lung cancer Other     Past Medical History:  Diagnosis Date  . Anxiety   . Chronic constipation   . GERD (gastroesophageal reflux disease)   . Hemorrhoids 2006  . Hyperlipemia   . Insomnia     Past Surgical History:  Procedure Laterality Date  . ABDOMINAL HYSTERECTOMY    . BIOPSY  06/25/2014   Dr. Rourk:malignant features are not identified  . Carpal tunnel right hand    . CESAREAN SECTION     x2  . COLONOSCOPY   05/19/2008   Rourk-Prominent internal hemorrhoids and anal papilla, otherwise normal appearing rectal mucosa / Colonic mucosa appeared normal except for melanosis coli  . COLONOSCOPY  08/22/2012   Rourk- hyperplastic polyps and tubular adenoma, query infectious colitis vs NSAID effect. Next TCS 08/2017.  Marland Kitchen ESOPHAGOGASTRODUODENOSCOPY   01/20/2005   Rourk- Normal-appearing esophagus, status post passage of a [place]  dilator/  small HH/  Otherwise normal stomach  . ESOPHAGOGASTRODUODENOSCOPY N/A 02/06/2013   VOZ:DGUYQI esophagus. Small hiatal hernia. Antral erosions: bx no h.pylori.  . ESOPHAGOGASTRODUODENOSCOPY N/A 06/25/2014   Dr. Myra Gianotti nodule-status post  biopsy; otherwise, normalEGD. No endoscopic explanation for patient's symptoms  . HEMORRHOID SURGERY  06/30/2008   Dr Arnoldo Morale  . KNEE ARTHROSCOPY     2010, RIGHT KNEE     Social History:  reports that she has never smoked. She has never used smokeless tobacco. She reports current alcohol use. She reports that she does not use drugs.  Allergies:  Allergies  Allergen Reactions  . Morphine And Related Nausea And Vomiting  . Shellfish Allergy Hives and Itching    Medications Prior to Admission  Medication Sig Dispense Refill  . cholecalciferol (VITAMIN D3) 25 MCG (1000 UNIT) tablet Take 2,000 Units by mouth daily.    Marland Kitchen ELDERBERRY PO Take 2 tablets by mouth daily.    . multivitamin-iron-minerals-folic acid (CENTRUM) chewable tablet Chew 1 tablet by mouth daily.    . psyllium (METAMUCIL) 58.6 % powder Take 1 packet by mouth daily.    . vitamin B-12 (CYANOCOBALAMIN) 100 MCG tablet Take 200 mcg by mouth daily.    . vitamin C (ASCORBIC ACID) 500 MG tablet Take 1,000 mg by mouth daily.    . Camphor-Menthol-Methyl Sal 1.2-5.7-6.3 % PTCH Apply 1 patch topically daily. (Patient not taking: Reported on 03/04/2021) 40 patch 0  . omeprazole (PRILOSEC) 20 MG capsule Take 1 capsule (20 mg total) by mouth daily. (Patient not taking: No sig reported) 30 capsule 2  . polyethylene glycol-electrolytes (TRILYTE) 420 g solution Take 4,000 mLs by mouth as directed. (Patient not taking: No sig reported) 4000 mL 0    Prior to Admission medications   Medication Sig Start Date End Date Taking? Authorizing Provider  cholecalciferol (VITAMIN D3) 25 MCG (1000 UNIT) tablet Take 2,000 Units by mouth daily.   Yes [provider]  ELDERBERRY PO Take 2 tablets by mouth daily.   Yes [provider]  multivitamin-iron-minerals-folic acid (CENTRUM) chewable tablet Chew 1 tablet by mouth daily.   Yes [provider]  psyllium (METAMUCIL) 58.6 % powder Take 1 packet by mouth daily.   Yes [provider]  vitamin B-12 (CYANOCOBALAMIN) 100 MCG tablet Take 200 mcg by mouth daily.   Yes [provider]  vitamin C (ASCORBIC ACID) 500 MG tablet Take 1,000 mg by mouth daily.   Yes [provider]  Camphor-Menthol-Methyl Sal 1.2-5.7-6.3 % PTCH Apply 1 patch topically daily. Patient not taking: Reported on 03/04/2021 09/30/17   Langston Masker B, PA-C  omeprazole (PRILOSEC) 20 MG capsule Take 1 capsule (20 mg total) by mouth daily. Patient not taking: No sig reported 11/22/16   Mahala Menghini, PA-C  polyethylene glycol-electrolytes (TRILYTE) 420 g solution Take 4,000 mLs by mouth as directed. Patient not taking: No sig reported 04/21/17   Rourk, Cristopher Estimable, MD    Blood pressure 125/86, pulse 67, temperature 98.1 F (36.7 C), temperature source Oral, resp. rate 16, height 5\' 1"  (1.549 m), weight 72.6 kg, SpO2 98 %. Physical Exam: General: pleasant, WD/WN female who is laying in bed in NAD HEENT: head is normocephalic, atraumatic.  Sclera are noninjected.  Pupils equal and round.  Ears and nose without any masses or lesions.  Mouth is pink and moist. Dentition fair Heart: regular, rate, and rhythm.  Normal s1,s2. ?faint murmur.  Palpable pedal pulses bilaterally  Lungs: CTAB, no wheezes, rhonchi, or rales  noted.  Respiratory effort nonlabored Abd: well healed vertical incision distal to umbilicus, soft, ND, +BS, no masses, hernias, or organomegaly. Focal RLQ TTP with voluntary guarding, otherwise abdomen nontender, no peritoneal signs MS: no BUE/BLE edema, calves soft and nontender Skin: warm and dry with no masses, lesions, or rashes Psych: A&Ox4 with an appropriate affect Neuro: cranial nerves grossly intact, equal strength in BUE/BLE bilaterally, normal speech, thought process intact  Results for orders placed or performed during the hospital encounter of 03/04/21 (from the past 48 hour(s))  Lipase, blood     Status: None   Collection Time: 03/04/21  5:19 PM  Result  Value Ref Range   Lipase 33 11 - 51 U/L    Comment: Performed at [facility], [place] [address], [place], [place] [address]  Comprehensive metabolic panel     Status: Abnormal   Collection Time: 03/04/21  5:19 PM  Result Value Ref Range   Sodium 139 135 - 145 mmol/L   Potassium 3.9 3.5 - 5.1 mmol/L   Chloride 106 98 - 111 mmol/L   CO2 25 22 - 32 mmol/L   Glucose, Bld 114 (H) 70 - 99 mg/dL    Comment: Glucose reference range applies only to samples taken after fasting for at least 8 hours.   BUN 11 8 - 23 mg/dL   Creatinine, Ser 0.76 0.44 - 1.00 mg/dL   Calcium 9.8 8.9 - 10.3 mg/dL   Total Protein 7.9 6.5 - 8.1 g/dL   Albumin 4.1 3.5 - 5.0 g/dL   AST 22 15 - 41 U/L   ALT 19 0 - 44 U/L   Alkaline Phosphatase 48 38 - 126 U/L   Total Bilirubin 1.2 0.3 - 1.2 mg/dL   GFR, Estimated >60 >60 mL/min    Comment: (NOTE) Calculated using the CKD-EPI Creatinine Equation (2021)    Anion gap 8 5 - 15    Comment: Performed at [facility], [place] [address], [place], [place] [address]  CBC     Status: Abnormal   Collection Time: 03/04/21  5:19 PM  Result Value Ref Range   WBC 6.4 4.0 - 10.5 K/uL   RBC 5.29 (H) 3.87 - 5.11 MIL/uL   Hemoglobin 13.6 12.0 - 15.0 g/dL   HCT 42.6 36.0 - 46.0 %   MCV 80.5 80.0 - 100.0 fL   MCH 25.7 (L) 26.0 - 34.0 pg   MCHC 31.9 30.0 - 36.0 g/dL   RDW 14.8 11.5 - 15.5 %   Platelets 282 150 - 400 K/uL   nRBC 0.0 0.0 - 0.2 %    Comment: Performed at [facility], [place] [address] [place], [place] [address]  Urinalysis, Routine w reflex microscopic     Status: None   Collection Time: 03/04/21  5:20 PM  Result Value Ref Range   Color, Urine YELLOW YELLOW   APPearance CLEAR CLEAR   Specific Gravity, Urine 1.018 1.005 - 1.030   pH 5.0 5.0 - 8.0   Glucose, UA NEGATIVE NEGATIVE mg/dL   Hgb urine dipstick NEGATIVE NEGATIVE   Bilirubin Urine NEGATIVE NEGATIVE   Ketones, ur NEGATIVE NEGATIVE mg/dL   Protein, ur  NEGATIVE NEGATIVE mg/dL   Nitrite NEGATIVE NEGATIVE   Leukocytes,Ua NEGATIVE NEGATIVE    Comment: Performed at [facility], [place] [address], [place], [place] [address]  Resp Panel by RT-PCR (Flu A&B, Covid) Nasopharyngeal Swab     Status: None   Collection Time: 03/04/21  8:25  PM   Specimen: Nasopharyngeal Swab; Nasopharyngeal(NP) swabs in vial transport medium  Result Value Ref Range   SARS Coronavirus 2 by RT PCR NEGATIVE NEGATIVE    Comment: (NOTE) SARS-CoV-2 target nucleic acids are NOT DETECTED.  The SARS-CoV-2 RNA is generally detectable in upper respiratory specimens during the acute phase of infection. The lowest concentration of SARS-CoV-2 viral copies this assay can detect is 138 copies/mL. A negative result does not preclude SARS-Cov-2 infection and should not be used as the sole basis for treatment or other patient management decisions. A negative result may occur with  improper specimen collection/handling, submission of specimen other than nasopharyngeal swab, presence of viral mutation(s) within the areas targeted by this assay, and inadequate number of viral copies(<138 copies/mL). A negative result must be combined with clinical observations, patient history, and epidemiological information. The expected result is Negative.  Fact Sheet for Patients:  EntrepreneurPulse.com.au  Fact Sheet for Healthcare Providers:  IncredibleEmployment.be  This test is no t yet approved or cleared by the [place] FDA and  has been authorized for detection and/or diagnosis of SARS-CoV-2 by FDA under an Emergency Use Authorization (EUA). This EUA will remain  in effect (meaning this test can be used) for the duration of the COVID-19 declaration under Section 564(b)(1) of the Act, 21 U.S.C.section 360bbb-3(b)(1), unless the authorization is terminated  or revoked sooner.       Influenza A by PCR NEGATIVE NEGATIVE    Influenza B by PCR NEGATIVE NEGATIVE    Comment: (NOTE) The Xpert Xpress SARS-CoV-2/FLU/RSV plus assay is intended as an aid in the diagnosis of influenza from Nasopharyngeal swab specimens and should not be used as a sole basis for treatment. Nasal washings and aspirates are unacceptable for Xpert Xpress SARS-CoV-2/FLU/RSV testing.  Fact Sheet for Patients: EntrepreneurPulse.com.au  Fact Sheet for Healthcare Providers: IncredibleEmployment.be  This test is not yet approved or cleared by the [place] FDA and has been authorized for detection and/or diagnosis of SARS-CoV-2 by FDA under an Emergency Use Authorization (EUA). This EUA will remain in effect (meaning this test can be used) for the duration of the COVID-19 declaration under Section 564(b)(1) of the Act, 21 U.S.C. section 360bbb-3(b)(1), unless the authorization is terminated or revoked.  Performed at [facility], [place] [address], [place], [place] [address]   CBC     Status: Abnormal   Collection Time: 03/05/21  3:29 AM  Result Value Ref Range   WBC 3.7 (L) 4.0 - 10.5 K/uL   RBC 4.66 3.87 - 5.11 MIL/uL   Hemoglobin 12.0 12.0 - 15.0 g/dL   HCT 38.2 36.0 - 46.0 %   MCV 82.0 80.0 - 100.0 fL   MCH 25.8 (L) 26.0 - 34.0 pg   MCHC 31.4 30.0 - 36.0 g/dL   RDW 14.9 11.5 - 15.5 %   Platelets 249 150 - 400 K/uL   nRBC 0.0 0.0 - 0.2 %    Comment: Performed at [facility], [place] [address], [place],  [address]  Basic metabolic panel     Status: Abnormal   Collection Time: 03/05/21  3:29 AM  Result Value Ref Range   Sodium 140 135 - 145 mmol/L   Potassium 4.3 3.5 - 5.1 mmol/L   Chloride 107 98 - 111 mmol/L   CO2 27 22 - 32 mmol/L   Glucose, Bld 104 (H) 70 - 99 mg/dL    Comment: Glucose reference range applies only to samples taken after fasting for at  least 8 hours.   BUN 11 8 - 23 mg/dL   Creatinine, Ser 0.77 0.44 - 1.00 mg/dL   Calcium  9.3 8.9 - 10.3 mg/dL   GFR, Estimated >60 >60 mL/min    Comment: (NOTE) Calculated using the CKD-EPI Creatinine Equation (2021)    Anion gap 6 5 - 15    Comment: Performed at [facility], [place] [address] [place], [place] [address]  Lipid panel     Status: Abnormal   Collection Time: 03/05/21  3:29 AM  Result Value Ref Range   Cholesterol 201 (H) 0 - 200 mg/dL   Triglycerides 44 <150 mg/dL   HDL 50 >40 mg/dL   Total CHOL/HDL Ratio 4.0 RATIO   VLDL 9 0 - 40 mg/dL   LDL Cholesterol 142 (H) 0 - 99 mg/dL    Comment:        Total Cholesterol/HDL:CHD Risk Coronary Heart Disease Risk Table                     Men   Women  1/2 Average Risk   3.4   3.3  Average Risk       5.0   4.4  2 X Average Risk   9.6   7.1  3 X Average Risk  23.4   11.0        Use the calculated Patient Ratio above and the CHD Risk Table to determine the patient's CHD Risk.        ATP III CLASSIFICATION (LDL):  <100     mg/dL   Optimal  100-129  mg/dL   Near or Above                    Optimal  130-159  mg/dL   Borderline  160-189  mg/dL   High  >190     mg/dL   Very High Performed at [place] [address], [place], [place] [address]    CT Abdomen Pelvis W Contrast  Result Date: 03/04/2021 CLINICAL DATA:  Mid abdominal pain and nausea. EXAM: CT ABDOMEN AND PELVIS WITH CONTRAST TECHNIQUE: Multidetector CT imaging of the abdomen and pelvis was performed using the standard protocol following bolus administration of intravenous contrast. CONTRAST:  163mL OMNIPAQUE IOHEXOL 300 MG/ML  SOLN COMPARISON:  September 30, 2017 FINDINGS: Lower chest: No acute abnormality. Hepatobiliary: No focal liver abnormality is seen. No gallstones, gallbladder wall thickening, or biliary dilatation. Pancreas: Unremarkable. No pancreatic ductal dilatation or surrounding inflammatory changes. Spleen: Normal in size without focal abnormality. Adrenals/Urinary Tract: Adrenal glands are unremarkable.  Kidneys are normal, without renal calculi, focal lesion, or hydronephrosis. Bladder is unremarkable. Stomach/Bowel: Normal appearance of the stomach. Nondistended small bowels with evidence of fecalization of the distal ileum the colon is decompressed. The appendix is not identified. Mild inflammatory changes along the distal cecum. Small amount of fluid in the right lower quadrant and in the pelvis. Vascular/Lymphatic: No significant vascular findings are present. No enlarged abdominal or pelvic lymph nodes. Reproductive: Status post hysterectomy. No adnexal masses. Other: No abdominal wall hernia or abnormality. Musculoskeletal: No acute or significant osseous findings. IMPRESSION: 1. Nondistended small bowels with evidence of mild mucosal thickening and fecalization of the distal ileum, with surrounding mild inflammatory changes and small amount of free fluid in the right lower quadrant and pelvis. 2. The appendix is not identified. Has the patient had an appendectomy? 3. Differential diagnosis includes infectious or inflammatory enterocolitis involving predominantly the  distal ileum. Alternatively, if the patient has not had an appendectomy, this could potentially represent ruptured appendicitis with reactive inflammatory changes of the adjacent bowel loops. No evidence of abscess formation. Please correlate clinically. 4. No evidence of acute abnormalities within the solid abdominal organs. Electronically Signed   By: Fidela Salisbury M.D.   On: 03/04/2021 19:58    Anti-infectives (From admission, onward)   Start     Dose/Rate Route Frequency Ordered Stop   03/05/21 0400  piperacillin-tazobactam (ZOSYN) IVPB 3.375 g        3.375 g 12.5 mL/hr over 240 Minutes Intravenous Every 8 hours 03/04/21 2248     03/04/21 2030  piperacillin-tazobactam (ZOSYN) IVPB 3.375 g        3.375 g 100 mL/hr over 30 Minutes Intravenous  Once 03/04/21 2026 03/04/21 2131       Assessment/Plan Chronic constipation -  takes daily metamucil which helps - Last colonoscopy 2013 by Dr. Gala Romney in [place] - hyperplastic polyps and tubular adenoma query infectious colitis versus NSAID effect and recommended next colonoscopy September 2018 which was not done  RLQ abdominal pain, nausea/vomiting Enterocolitis  - 3 weeks of upper abdominal pain, acute RLQ pain x1 day with nausea and vomiting - CT scan 3/31 shows nondistended small bowels with evidence of mild mucosal thickening and fecalization of the distal ileum with surrounding mild inflammatory changes and small amount of free fluid in the right lower quadrant and pelvis; the appendix is not identified; possible infectious or inflammatory enterocolitis involving predominantly the distal ileum; radiology also questions possible ruptured appendicitis; no abscess  ID - zosyn 3/31>> VTE - sq herarin FEN - IVF, NPO Foley - none Follow up - TBD  Plan: Patient is focally tender in the RLQ with some voluntary guarding, otherwise abdomen is soft. No overt signs of bowel ischemia on CT scan. She is passing flatus and her vital signs are stable. No elevated WBC and lab work unremarkable. Low suspicion for ruptured appendicitis. I do not think she needs acute surgical intervention. Will review case with MD. Agree with bowel rest and GI consult given CT scan findings and abnormal colonoscopy (colitis) in 2013.    Wellington Hampshire, [facility] 03/05/2021, 10:25 AM Please see Amion for pager number during day hours 7:00am-4:30pm

## 2021-03-06 DIAGNOSIS — R933 Abnormal findings on diagnostic imaging of other parts of digestive tract: Secondary | ICD-10-CM | POA: Diagnosis not present

## 2021-03-06 DIAGNOSIS — D709 Neutropenia, unspecified: Secondary | ICD-10-CM

## 2021-03-06 DIAGNOSIS — R1031 Right lower quadrant pain: Secondary | ICD-10-CM | POA: Diagnosis not present

## 2021-03-06 LAB — DIFFERENTIAL
Abs Immature Granulocytes: 0 10*3/uL (ref 0.00–0.07)
Basophils Absolute: 0 10*3/uL (ref 0.0–0.1)
Basophils Relative: 1 %
Eosinophils Absolute: 0.1 10*3/uL (ref 0.0–0.5)
Eosinophils Relative: 4 %
Immature Granulocytes: 0 %
Lymphocytes Relative: 57 %
Lymphs Abs: 1.9 10*3/uL (ref 0.7–4.0)
Monocytes Absolute: 0.3 10*3/uL (ref 0.1–1.0)
Monocytes Relative: 11 %
Neutro Abs: 0.9 10*3/uL — ABNORMAL LOW (ref 1.7–7.7)
Neutrophils Relative %: 27 %

## 2021-03-06 LAB — RENAL FUNCTION PANEL
Albumin: 3.2 g/dL — ABNORMAL LOW (ref 3.5–5.0)
Anion gap: 7 (ref 5–15)
BUN: 11 mg/dL (ref 8–23)
CO2: 26 mmol/L (ref 22–32)
Calcium: 8.7 mg/dL — ABNORMAL LOW (ref 8.9–10.3)
Chloride: 108 mmol/L (ref 98–111)
Creatinine, Ser: 0.82 mg/dL (ref 0.44–1.00)
GFR, Estimated: >60 mL/min (ref >60)
Glucose, Bld: 67 mg/dL — ABNORMAL LOW (ref 70–99)
Phosphorus: 3.5 mg/dL (ref 2.5–4.6)
Potassium: 3.8 mmol/L (ref 3.5–5.1)
Sodium: 141 mmol/L (ref 135–145)

## 2021-03-06 LAB — MAGNESIUM: Magnesium: 1.8 mg/dL (ref 1.7–2.4)

## 2021-03-06 LAB — CBC
HCT: 36.1 % (ref 36.0–46.0)
Hemoglobin: 11.1 g/dL — ABNORMAL LOW (ref 12.0–15.0)
MCH: 25.2 pg — ABNORMAL LOW (ref 26.0–34.0)
MCHC: 30.7 g/dL (ref 30.0–36.0)
MCV: 81.9 fL (ref 80.0–100.0)
Platelets: 235 10*3/uL (ref 150–400)
RBC: 4.41 MIL/uL (ref 3.87–5.11)
RDW: 14.9 % (ref 11.5–15.5)
WBC: 3.3 10*3/uL — ABNORMAL LOW (ref 4.0–10.5)
nRBC: 0 % (ref 0.0–0.2)

## 2021-03-06 LAB — GLUCOSE, CAPILLARY: Glucose-Capillary: 112 mg/dL — ABNORMAL HIGH (ref 70–99)

## 2021-03-06 MED ORDER — SODIUM CHLORIDE 0.9 % IV SOLN: INTRAVENOUS | Status: DC

## 2021-03-06 MED ORDER — POLYETHYLENE GLYCOL 3350 17 G PO PACK
17.0000 g | PACK | Freq: Every day | ORAL | Status: DC
  Administered 2021-03-06: 17 g via ORAL
  Filled 2021-03-06 (×2): qty 1

## 2021-03-06 NOTE — Progress Notes (Signed)
Subjective/Chief Complaint: Feels ok no better no worse  No BM    Objective: Vital signs in last 24 hours: Temp:  [97.8 F (36.6 C)-98.1 F (36.7 C)] 97.8 F (36.6 C) (04/02 0506) Pulse Rate:  [66-69] 69 (04/02 0506) Resp:  [16-17] 16 (04/02 0506) BP: (125-150)/(75-92) 150/92 (04/02 0506) SpO2:  [98 %-100 %] 98 % (04/02 0826) Last BM Date: 03/05/21  Intake/Output from previous day: 04/01 0701 - 04/02 0700 In: 2140.5 [P.O.:240; I.V.:1750.5; IV Piggyback:150] Out: -  Intake/Output this shift: No intake/output data recorded.   General: pleasant, WD/WN female who is laying in bed in NAD HEENT: head is normocephalic, atraumatic.  Sclera are noninjected.  Pupils equal and round.  Ears and nose without any masses or lesions.  Mouth is pink and moist. Dentition fair Heart: regular, rate, and rhythm.  Normal s1,s2. ?faint murmur.  Palpable pedal pulses bilaterally  Lungs: CTAB, no wheezes, rhonchi, or rales noted.  Respiratory effort nonlabored Abd: well healed vertical incision distal to umbilicus, soft, ND, +BS, no masses, hernias, or organomegaly. Focal RLQ TTP with NO GUARDING or REBOUND  MS: no BUE/BLE edema, calves soft and nontender Skin: warm and dry with no masses, lesions, or rashes Psych: A&Ox4 with an appropriate affect Neuro: cranial nerves grossly intact, equal strength in BUE/BLE bilaterally, normal speech, thought process intact  Lab Results:  Recent Labs    03/05/21 0329 03/06/21 0322  WBC 3.7* 3.3*  HGB 12.0 11.1*  HCT 38.2 36.1  PLT 249 235   BMET Recent Labs    03/05/21 0329 03/06/21 0322  NA 140 141  K 4.3 3.8  CL 107 108  CO2 27 26  GLUCOSE 104* 67*  BUN 11 11  CREATININE 0.77 0.82  CALCIUM 9.3 8.7*   PT/INR No results for input(s): LABPROT, INR in the last 72 hours. ABG No results for input(s): PHART, HCO3 in the last 72 hours.  Invalid input(s): PCO2, PO2  Studies/Results: CT Abdomen Pelvis W Contrast  Result Date:  03/04/2021 CLINICAL DATA:  Mid abdominal pain and nausea. EXAM: CT ABDOMEN AND PELVIS WITH CONTRAST TECHNIQUE: Multidetector CT imaging of the abdomen and pelvis was performed using the standard protocol following bolus administration of intravenous contrast. CONTRAST:  128mL OMNIPAQUE IOHEXOL 300 MG/ML  SOLN COMPARISON:  September 30, 2017 FINDINGS: Lower chest: No acute abnormality. Hepatobiliary: No focal liver abnormality is seen. No gallstones, gallbladder wall thickening, or biliary dilatation. Pancreas: Unremarkable. No pancreatic ductal dilatation or surrounding inflammatory changes. Spleen: Normal in size without focal abnormality. Adrenals/Urinary Tract: Adrenal glands are unremarkable. Kidneys are normal, without renal calculi, focal lesion, or hydronephrosis. Bladder is unremarkable. Stomach/Bowel: Normal appearance of the stomach. Nondistended small bowels with evidence of fecalization of the distal ileum the colon is decompressed. The appendix is not identified. Mild inflammatory changes along the distal cecum. Small amount of fluid in the right lower quadrant and in the pelvis. Vascular/Lymphatic: No significant vascular findings are present. No enlarged abdominal or pelvic lymph nodes. Reproductive: Status post hysterectomy. No adnexal masses. Other: No abdominal wall hernia or abnormality. Musculoskeletal: No acute or significant osseous findings. IMPRESSION: 1. Nondistended small bowels with evidence of mild mucosal thickening and fecalization of the distal ileum, with surrounding mild inflammatory changes and small amount of free fluid in the right lower quadrant and pelvis. 2. The appendix is not identified. Has the patient had an appendectomy? 3. Differential diagnosis includes infectious or inflammatory enterocolitis involving predominantly the distal ileum. Alternatively, if the patient has not  had an appendectomy, this could potentially represent ruptured appendicitis with reactive inflammatory  changes of the adjacent bowel loops. No evidence of abscess formation. Please correlate clinically. 4. No evidence of acute abnormalities within the solid abdominal organs. Electronically Signed   By: Fidela Salisbury M.D.   On: 03/04/2021 19:58    Anti-infectives: Anti-infectives (From admission, onward)   Start     Dose/Rate Route Frequency Ordered Stop   03/05/21 0400  piperacillin-tazobactam (ZOSYN) IVPB 3.375 g        3.375 g 12.5 mL/hr over 240 Minutes Intravenous Every 8 hours 03/04/21 2248     03/04/21 2030  piperacillin-tazobactam (ZOSYN) IVPB 3.375 g        3.375 g 100 mL/hr over 30 Minutes Intravenous  Once 03/04/21 2026 03/04/21 2131      Assessment/Plan: Chronic constipation - takes daily metamucil which helps - Last colonoscopy 2013 by Dr. Gala Romney in [place] - hyperplastic polyps and tubular adenoma query infectious colitis versus NSAID effect and recommended next colonoscopy September 2018 which was not done  RLQ abdominal pain, nausea/vomiting Enterocolitis  - 3 weeks of upper abdominal pain, acute RLQ pain x1 day with nausea and vomiting - CT scan 3/31 shows nondistended small bowels with evidence of mild mucosal thickening and fecalization of the distal ileum with surrounding mild inflammatory changes and small amount of free fluid in the right lower quadrant and pelvis; the appendix is not identified; possible infectious or inflammatory enterocolitis involving predominantly the distal ileum; radiology also questions possible ruptured appendicitis; no abscess  Stable this am with normal WBC and no left shift  Mild RLQ tenderness without rebound or guarding   No vomiting  Can start clears  This appears to be a chronic issue given fecalization on CT  Favor inflammatory process more than appendicitis but can cover with ABX since unclear   Malignancy another possibility  Ischemia unlikely at this point   Needs a colonoscopy at some point since she is due from  last one in 2013  once inflammatory process settles down No acute surgical need currently so clears OK   ID - zosyn 3/31>> VTE - sq herarin FEN - IVF, NPO Foley - none   LOS: 2 days    Turner Daniels MD  03/06/2021

## 2021-03-06 NOTE — Progress Notes (Addendum)
Attending physician's note   I have taken an interval history, reviewed the chart and examined the patient. I agree with the Advanced Practitioner's note, impression, and recommendations as outlined.  Feeling a little better today.  Pain responds well to Tylenol.  Just had small volume BM this afternoon.  No nausea/vomiting.  Tolerated clear lunch without issue.  -As she is continuing to improve clinically, do not feel there is a need for urgent inpatient colonoscopy.  Instead, would allow for appropriate healing with outpatient colonoscopy in 6-8 weeks -Inflammatory markers are interestingly normal.  Tend to agree with surgical team that this is more of a chronic process, which can again be evaluated with outpatient colonoscopy with Dr. Gala Romney -Can continue to slowly advance diet if she tolerates clears okay -Continue empiric ABX and IVF -GI service will follow peripherally through the remainder of the weekend.  If she still inpatient on Monday, will plan to see again then.  Certainly do not hesitate to contact on-call GI if there are additional questions or concerns in the interim.  Teshia Mahone, DO, FACG [phone] office             [facility] Progress Note  CC:  Abdominal pain and abnormal CT scan  Subjective:  Feeling a better today.  Passing some flatus but no BMs.    Objective:  Vital signs in last 24 hours: Temp:  [97.8 F (36.6 C)-98.1 F (36.7 C)] 97.8 F (36.6 C) (04/02 0506) Pulse Rate:  [66-69] 69 (04/02 0506) Resp:  [16-17] 16 (04/02 0506) BP: (125-150)/(75-92) 150/92 (04/02 0506) SpO2:  [98 %-100 %] 98 % (04/02 0826) Last BM Date: 03/05/21 General:  Alert, Well-developed, in NAD Heart:  Regular rate and rhythm; no murmurs Pulm:  CTAB.  No W/R/R. Abdomen:  Soft, non-distended.  BS present.  Mild diffuse TTP, more so in the RLQ. Extremities:  Without edema. Neurologic:  Alert and oriented x 4;  grossly normal neurologically. Psych:   Alert and cooperative. Normal mood and affect.  Intake/Output from previous day: 04/01 0701 - 04/02 0700 In: 2140.5 [P.O.:240; I.V.:1750.5; IV Piggyback:150] Out: -   Lab Results: Recent Labs    03/04/21 1719 03/05/21 0329 03/06/21 0322  WBC 6.4 3.7* 3.3*  HGB 13.6 12.0 11.1*  HCT 42.6 38.2 36.1  PLT 282 249 235   BMET Recent Labs    03/04/21 1719 03/05/21 0329 03/06/21 0322  NA 139 140 141  K 3.9 4.3 3.8  CL 106 107 108  CO2 25 27 26   GLUCOSE 114* 104* 67*  BUN 11 11 11   CREATININE 0.76 0.77 0.82  CALCIUM 9.8 9.3 8.7*   LFT Recent Labs    03/04/21 1719 03/06/21 0322  PROT 7.9  --   ALBUMIN 4.1 3.2*  AST 22  --   ALT 19  --   ALKPHOS 48  --   BILITOT 1.2  --    CT Abdomen Pelvis W Contrast  Result Date: 03/04/2021 CLINICAL DATA:  Mid abdominal pain and nausea. EXAM: CT ABDOMEN AND PELVIS WITH CONTRAST TECHNIQUE: Multidetector CT imaging of the abdomen and pelvis was performed using the standard protocol following bolus administration of intravenous contrast. CONTRAST:  130mL OMNIPAQUE IOHEXOL 300 MG/ML  SOLN COMPARISON:  September 30, 2017 FINDINGS: Lower chest: No acute abnormality. Hepatobiliary: No focal liver abnormality is seen. No gallstones, gallbladder wall thickening, or biliary dilatation. Pancreas: Unremarkable. No pancreatic ductal dilatation or surrounding inflammatory changes. Spleen: Normal in size without focal abnormality. Adrenals/Urinary Tract:  Adrenal glands are unremarkable. Kidneys are normal, without renal calculi, focal lesion, or hydronephrosis. Bladder is unremarkable. Stomach/Bowel: Normal appearance of the stomach. Nondistended small bowels with evidence of fecalization of the distal ileum the colon is decompressed. The appendix is not identified. Mild inflammatory changes along the distal cecum. Small amount of fluid in the right lower quadrant and in the pelvis. Vascular/Lymphatic: No significant vascular findings are present. No enlarged  abdominal or pelvic lymph nodes. Reproductive: Status post hysterectomy. No adnexal masses. Other: No abdominal wall hernia or abnormality. Musculoskeletal: No acute or significant osseous findings. IMPRESSION: 1. Nondistended small bowels with evidence of mild mucosal thickening and fecalization of the distal ileum, with surrounding mild inflammatory changes and small amount of free fluid in the right lower quadrant and pelvis. 2. The appendix is not identified. Has the patient had an appendectomy? 3. Differential diagnosis includes infectious or inflammatory enterocolitis involving predominantly the distal ileum. Alternatively, if the patient has not had an appendectomy, this could potentially represent ruptured appendicitis with reactive inflammatory changes of the adjacent bowel loops. No evidence of abscess formation. Please correlate clinically. 4. No evidence of acute abnormalities within the solid abdominal organs. Electronically Signed   By: Fidela Salisbury M.D.   On: 03/04/2021 19:58   Assessment / Plan: *60 year old female with complaints of abdominal pain and nausea with CT scan showing mild thickening and inflammatory changes of the distal ileum/enterocolitis.  ? Infectious or inflammatory in nature.  Reports some non-specific GI discomfort and decreased appetite for a couple of weeks but sudden onset of this pain and nausea yesterday.  CRP is 1.  Sed rate is normal.  She is on Zosyn empirically.  -Supportive care with pain control, antiemetics, IVFs, Zosyn.  Will allow clears liquids today. -Eventual colonoscopy, timing TBD pending her course.  If she improves then will follow-up as outpatient with Dr. Gala Romney.   LOS: 2 days   Laban Emperor. Zehr  03/06/2021, 9:11 AM

## 2021-03-06 NOTE — Plan of Care (Signed)

## 2021-03-06 NOTE — Progress Notes (Signed)
PROGRESS NOTE  Nicole Gilbert JOI:[id number] DOB: Sep 11, 1961   PCP: Berkley Harvey, NP  Patient is from: Home  DOA: 03/04/2021 LOS: 2  Chief complaints: Abdominal pain, nausea and chills  Brief Narrative / Interim history: 60 year old F with PMH of anxiety, chronic constipation, hemorrhoids, GERD, HLD, cesarean section x2 and hysterectomy presenting with nausea, abdominal pain and chills, and admitted with working diagnosis of enterocolitis with unclear etiology.  Vital signs basically normal.  CBC, CMP and UA without significant finding.  She was a started on IV fluid, IV Zosyn, analgesics and antiemetics.  GI and general surgery consulted, and following.  Patient has no diarrhea.  Subjective: Seen and examined earlier this morning.  No major events overnight or this morning.  Reports improvement in her pain.  Pain is in RLQ radiating down into her right groin.  Described the pain as sharp.  She rates her pain 4-5 on a scale of 10.  Pain is worse with movement.  Has been passing gas but no bowel movement yet.  Denies nausea or vomiting.  Denies chest pain or shortness of breath.  Daughter at bedside.  Objective: Vitals:   03/05/21 2041 03/06/21 0506 03/06/21 0826 03/06/21 1244  BP: (!) 128/91 (!) 150/92  118/80  Pulse: 66 69  68  Resp: 16 16  16   Temp: 97.9 F (36.6 C) 97.8 F (36.6 C)  98.3 F (36.8 C)  TempSrc: Oral Oral    SpO2: 98% 100% 98% 100%  Weight:      Height:        Intake/Output Summary (Last 24 hours) at 03/06/2021 1312 Last data filed at 03/06/2021 1000 Gross per 24 hour  Intake 2260.47 ml  Output --  Net 2260.47 ml   Filed Weights   03/04/21 1628  Weight: 72.6 kg    Examination:  GENERAL: No apparent distress.  Nontoxic. HEENT: MMM.  Vision and hearing grossly intact.  NECK: Supple.  No apparent JVD.  RESP: On RA.  No IWOB.  Fair aeration bilaterally. CVS:  RRR. Heart sounds normal.  No audible murmur. ABD/GI/GU: BS+. Abd soft.  RLQ tenderness with  rebound.  No guarding. MSK/EXT:  Moves extremities. No apparent deformity. No edema.  SKIN: no apparent skin lesion or wound NEURO: Awake, alert and oriented appropriately.  No apparent focal neuro deficit. PSYCH: Calm. Normal affect.  Procedures:  None  Microbiology summarized: [id number] and influenza PCR nonreactive.  Assessment & Plan: Acute abdominal pain/enterocolitis: unclear etiology.  CT abdomen and pelvis showed mild mucosal thickening and fecalization of the distal ileum with surrounding mild inflammatory change and small amount of free fluid in the RLQ and pelvis raising concern for enterocolitis versus possible ruptured appendicitis.  However, she has no fever, leukocytosis or elevated inflammatory markers . No personal or family history of IBD.  Reportedly few polyps resected when she had a colonoscopy.  Abdominal pain improving but continues to have rebound tenderness on my exam.  Appears well. -General surgery and GI following -Continue IV fluid, IV Zosyn, analgesics and antiemetics. -Starting clear liquid diet.  History of chronic constipation-uses psyllium -MiraLAX as needed  Hyperlipidemia?  LDL 142.  ASCVD risk score 5.5% -Encourage lifestyle change  Leukopenia/neutropenia: Reportedly chronic issue. -May benefit from outpatient hematology evaluation -Check anemia panel in the morning  Class I obesity Body mass index is 30.23 kg/m.  -Encourage lifestyle change to lose weight       DVT prophylaxis:  heparin injection 5,000 Units Start: 03/05/21 0600  Code  Status: Full code Family Communication: Patient and/or RN. Available if any question.  Level of care: Telemetry Status is: Inpatient  Remains inpatient appropriate because:Ongoing diagnostic testing needed not appropriate for outpatient work up, IV treatments appropriate due to intensity of illness or inability to take PO and Inpatient level of care appropriate due to severity of illness   Dispo: The  patient is from: Home              Anticipated d/c is to: Home              Patient currently is not medically stable to d/c.   Difficult to place patient No       Consultants:  General surgery Gastroenterology   Sch Meds:  Scheduled Meds: . heparin  5,000 Units Subcutaneous Q8H  . polyethylene glycol  17 g Oral Daily   Continuous Infusions: . sodium chloride 100 mL/hr at 03/06/21 0318  . piperacillin-tazobactam (ZOSYN)  IV 3.375 g (03/06/21 1201)   PRN Meds:.acetaminophen **OR** acetaminophen, fentaNYL (SUBLIMAZE) injection, ondansetron (ZOFRAN) IV  Antimicrobials: Anti-infectives (From admission, onward)   Start     Dose/Rate Route Frequency Ordered Stop   03/05/21 0400  piperacillin-tazobactam (ZOSYN) IVPB 3.375 g        3.375 g 12.5 mL/hr over 240 Minutes Intravenous Every 8 hours 03/04/21 2248     03/04/21 2030  piperacillin-tazobactam (ZOSYN) IVPB 3.375 g        3.375 g 100 mL/hr over 30 Minutes Intravenous  Once 03/04/21 2026 03/04/21 2131       I have personally reviewed the following labs and images: CBC: Recent Labs  Lab 03/04/21 1719 03/05/21 0329 03/06/21 0322  WBC 6.4 3.7* 3.3*  NEUTROABS  --   --  0.9*  HGB 13.6 12.0 11.1*  HCT 42.6 38.2 36.1  MCV 80.5 82.0 81.9  PLT 282 249 235   BMP &GFR Recent Labs  Lab 03/04/21 1719 03/05/21 0329 03/06/21 0322  NA 139 140 141  K 3.9 4.3 3.8  CL 106 107 108  CO2 25 27 26   GLUCOSE 114* 104* 67*  BUN 11 11 11   CREATININE 0.76 0.77 0.82  CALCIUM 9.8 9.3 8.7*  MG  --   --  1.8  PHOS  --   --  3.5   Estimated Creatinine Clearance: 65.6 mL/min (by C-G formula based on SCr of 0.82 mg/dL). Liver & Pancreas: Recent Labs  Lab 03/04/21 1719 03/06/21 0322  AST 22  --   ALT 19  --   ALKPHOS 48  --   BILITOT 1.2  --   PROT 7.9  --   ALBUMIN 4.1 3.2*   Recent Labs  Lab 03/04/21 1719  LIPASE 33   No results for input(s): AMMONIA in the last 168 hours. Diabetic: No results for input(s): HGBA1C  in the last 72 hours. Recent Labs  Lab 03/06/21 1242  GLUCAP 112*   Cardiac Enzymes: No results for input(s): CKTOTAL, CKMB, CKMBINDEX, TROPONINI in the last 168 hours. No results for input(s): PROBNP in the last 8760 hours. Coagulation Profile: No results for input(s): INR, PROTIME in the last 168 hours. Thyroid Function Tests: No results for input(s): TSH, T4TOTAL, FREET4, T3FREE, THYROIDAB in the last 72 hours. Lipid Profile: Recent Labs    03/05/21 0329  CHOL 201*  HDL 50  LDLCALC 142*  TRIG 44  CHOLHDL 4.0   Anemia Panel: No results for input(s): VITAMINB12, FOLATE, FERRITIN, TIBC, IRON, RETICCTPCT in the last 72 hours. Urine  analysis:    Component Value Date/Time   COLORURINE YELLOW 03/04/2021 1720   APPEARANCEUR CLEAR 03/04/2021 1720   LABSPEC 1.018 03/04/2021 1720   PHURINE 5.0 03/04/2021 1720   GLUCOSEU NEGATIVE 03/04/2021 1720   HGBUR NEGATIVE 03/04/2021 1720   BILIRUBINUR NEGATIVE 03/04/2021 1720   KETONESUR NEGATIVE 03/04/2021 1720   PROTEINUR NEGATIVE 03/04/2021 1720   UROBILINOGEN 0.2 10/07/2015 0520   NITRITE NEGATIVE 03/04/2021 1720   LEUKOCYTESUR NEGATIVE 03/04/2021 1720   Sepsis Labs: Invalid input(s): PROCALCITONIN, [place]  Microbiology: Recent Results (from the past 240 hour(s))  Resp Panel by RT-PCR (Flu A&B, Covid) Nasopharyngeal Swab     Status: None   Collection Time: 03/04/21  8:25 PM   Specimen: Nasopharyngeal Swab; Nasopharyngeal(NP) swabs in vial transport medium  Result Value Ref Range Status   SARS Coronavirus 2 by RT PCR NEGATIVE NEGATIVE Final    Comment: (NOTE) SARS-CoV-2 target nucleic acids are NOT DETECTED.  The SARS-CoV-2 RNA is generally detectable in upper respiratory specimens during the acute phase of infection. The lowest concentration of SARS-CoV-2 viral copies this assay can detect is 138 copies/mL. A negative result does not preclude SARS-Cov-2 infection and should not be used as the sole basis for treatment  or other patient management decisions. A negative result may occur with  improper specimen collection/handling, submission of specimen other than nasopharyngeal swab, presence of viral mutation(s) within the areas targeted by this assay, and inadequate number of viral copies(<138 copies/mL). A negative result must be combined with clinical observations, patient history, and epidemiological information. The expected result is Negative.  Fact Sheet for Patients:  EntrepreneurPulse.com.au  Fact Sheet for Healthcare Providers:  IncredibleEmployment.be  This test is no t yet approved or cleared by the [place] FDA and  has been authorized for detection and/or diagnosis of SARS-CoV-2 by FDA under an Emergency Use Authorization (EUA). This EUA will remain  in effect (meaning this test can be used) for the duration of the COVID-19 declaration under Section 564(b)(1) of the Act, 21 U.S.C.section 360bbb-3(b)(1), unless the authorization is terminated  or revoked sooner.       Influenza A by PCR NEGATIVE NEGATIVE Final   Influenza B by PCR NEGATIVE NEGATIVE Final    Comment: (NOTE) The Xpert Xpress SARS-CoV-2/FLU/RSV plus assay is intended as an aid in the diagnosis of influenza from Nasopharyngeal swab specimens and should not be used as a sole basis for treatment. Nasal washings and aspirates are unacceptable for Xpert Xpress SARS-CoV-2/FLU/RSV testing.  Fact Sheet for Patients: EntrepreneurPulse.com.au  Fact Sheet for Healthcare Providers: IncredibleEmployment.be  This test is not yet approved or cleared by the [place] FDA and has been authorized for detection and/or diagnosis of SARS-CoV-2 by FDA under an Emergency Use Authorization (EUA). This EUA will remain in effect (meaning this test can be used) for the duration of the COVID-19 declaration under Section 564(b)(1) of the Act, 21 U.S.C. section  360bbb-3(b)(1), unless the authorization is terminated or revoked.  Performed at [facility], [place] [address], [place], [place] [address]     Radiology Studies: No results found.    Aleeha Boline T. [place]cl[place]  If 7PM-7AM, please contact night-coverage [email or web address] 03/06/2021, 1:12 PM

## 2021-03-07 DIAGNOSIS — R197 Diarrhea, unspecified: Secondary | ICD-10-CM

## 2021-03-07 LAB — RENAL FUNCTION PANEL
Albumin: 3.4 g/dL — ABNORMAL LOW (ref 3.5–5.0)
Anion gap: 8 (ref 5–15)
BUN: 6 mg/dL — ABNORMAL LOW (ref 8–23)
CO2: 24 mmol/L (ref 22–32)
Calcium: 8.8 mg/dL — ABNORMAL LOW (ref 8.9–10.3)
Chloride: 108 mmol/L (ref 98–111)
Creatinine, Ser: 0.82 mg/dL (ref 0.44–1.00)
GFR, Estimated: >60 mL/min (ref >60)
Glucose, Bld: 87 mg/dL (ref 70–99)
Phosphorus: 3.8 mg/dL (ref 2.5–4.6)
Potassium: 3.6 mmol/L (ref 3.5–5.1)
Sodium: 140 mmol/L (ref 135–145)

## 2021-03-07 LAB — RETICULOCYTES
Immature Retic Fract: 9.5 % (ref 2.3–15.9)
RBC.: 4.48 MIL/uL (ref 3.87–5.11)
Retic Count, Absolute: 78.8 10*3/uL (ref 19.0–186.0)
Retic Ct Pct: 1.8 % (ref 0.4–3.1)

## 2021-03-07 LAB — IRON AND TIBC
Iron: 51 ug/dL (ref 28–170)
Saturation Ratios: 15 % (ref 10.4–31.8)
TIBC: 345 ug/dL (ref 250–450)
UIBC: 294 ug/dL

## 2021-03-07 LAB — CBC
HCT: 37 % (ref 36.0–46.0)
Hemoglobin: 11.7 g/dL — ABNORMAL LOW (ref 12.0–15.0)
MCH: 25.7 pg — ABNORMAL LOW (ref 26.0–34.0)
MCHC: 31.6 g/dL (ref 30.0–36.0)
MCV: 81.1 fL (ref 80.0–100.0)
Platelets: 239 10*3/uL (ref 150–400)
RBC: 4.56 MIL/uL (ref 3.87–5.11)
RDW: 14.8 % (ref 11.5–15.5)
WBC: 2.8 10*3/uL — ABNORMAL LOW (ref 4.0–10.5)
nRBC: 0 % (ref 0.0–0.2)

## 2021-03-07 LAB — FOLATE: Folate: 20 ng/mL (ref >5.9)

## 2021-03-07 LAB — MAGNESIUM: Magnesium: 1.8 mg/dL (ref 1.7–2.4)

## 2021-03-07 LAB — FERRITIN: Ferritin: 62 ng/mL (ref 11–307)

## 2021-03-07 LAB — VITAMIN B12: Vitamin B-12: 892 pg/mL (ref 180–914)

## 2021-03-07 MED ORDER — AMOXICILLIN-POT CLAVULANATE 875-125 MG PO TABS
1.0000 | ORAL_TABLET | Freq: Two times a day (BID) | ORAL | Status: DC
  Administered 2021-03-07 – 2021-03-09 (×5): 1 via ORAL
  Filled 2021-03-07 (×5): qty 1

## 2021-03-07 MED ORDER — POLYETHYLENE GLYCOL 3350 17 G PO PACK: 17.0000 g | PACK | Freq: Every day | ORAL | Status: DC | PRN

## 2021-03-07 NOTE — Progress Notes (Signed)
Subjective/Chief Complaint: Patient sitting in chair.  She is no worse she states and  her pain is still present in her right lower quadrant.3-5/10 with some radiation toward right flank  It is not any worse than it was yesterday she states.  She had 2 loose bowel movements yesterday.   Objective: Vital signs in last 24 hours: Temp:  [97.9 F (36.6 C)-98.3 F (36.8 C)] 98 F (36.7 C) (04/03 0530) Pulse Rate:  [62-68] 62 (04/03 0530) Resp:  [16-20] 18 (04/03 0530) BP: (118-139)/(80-91) 125/83 (04/03 0530) SpO2:  [100 %] 100 % (04/03 0530) Last BM Date: 03/05/21  Intake/Output from previous day: 04/02 0701 - 04/03 0700 In: 776.7 [P.O.:240; I.V.:536.7] Out: -  Intake/Output this shift: Total I/O In: 300 [P.O.:300] Out: -    General: pleasant, WD/WNfemale who is laying in bed in NAD Lungs: CTAB, no wheezes, rhonchi, or rales noted. Respiratory effort nonlabored ZOX:WRUE healed verticalincisiondistal to umbilicus, soft, ND, +BS, no masses, hernias, or organomegaly. Focal RLQ TTP with NO GUARDING or REBOUND  MS: no BUE/BLE edema, calves soft and nontender Skin: warm and dry with no masses, lesions, or rashes Psych: A&Ox4 with an appropriate affect Neuro: cranial nerves grossly intact, equal strength in BUE/BLE bilaterally, normal speech, thought process intact Lab Results:  Recent Labs    03/06/21 0322 03/07/21 0313  WBC 3.3* 2.8*  HGB 11.1* 11.7*  HCT 36.1 37.0  PLT 235 239   BMET Recent Labs    03/06/21 0322 03/07/21 0313  NA 141 140  K 3.8 3.6  CL 108 108  CO2 26 24  GLUCOSE 67* 87  BUN 11 6*  CREATININE 0.82 0.82  CALCIUM 8.7* 8.8*   PT/INR No results for input(s): LABPROT, INR in the last 72 hours. ABG No results for input(s): PHART, HCO3 in the last 72 hours.  Invalid input(s): PCO2, PO2  Studies/Results: No results found.  Anti-infectives: Anti-infectives (From admission, onward)   Start     Dose/Rate Route Frequency Ordered Stop    03/07/21 1000  amoxicillin-clavulanate (AUGMENTIN) 875-125 MG per tablet 1 tablet        1 tablet Oral Every 12 hours 03/07/21 0728 03/14/21 0959   03/05/21 0400  piperacillin-tazobactam (ZOSYN) IVPB 3.375 g  Status:  Discontinued        3.375 g 12.5 mL/hr over 240 Minutes Intravenous Every 8 hours 03/04/21 2248 03/07/21 0728   03/04/21 2030  piperacillin-tazobactam (ZOSYN) IVPB 3.375 g        3.375 g 100 mL/hr over 30 Minutes Intravenous  Once 03/04/21 2026 03/04/21 2131      Assessment/Plan: Chronic constipation- takes daily metamucil which helps - Last colonoscopy2013 by Dr. Gala Romney in [place] -hyperplastic polyps and tubular adenoma query infectious colitisversus NSAID effect and recommended next colonoscopy September 2018 which was not done  RLQ abdominal pain, nausea/vomiting Enterocolitis  - 3 weeks of upper abdominal pain, acute RLQ pain x1 day with nausea and vomiting - CT scan 3/31 shows nondistended small bowels with evidence of mild mucosal thickening and fecalization of the distal ileum with surrounding mild inflammatory changes and small amount of free fluid in the right lower quadrant and pelvis; the appendix is not identified; possibleinfectious or inflammatory enterocolitis involving predominantly the distal ileum; radiology also questions possible ruptured appendicitis; no abscess   Stable this am with normal WBC and no left shift  Mild RLQ tenderness without rebound or guarding  May need repeat CT in 1 - 2 days if no further improvement  No vomiting  Can start clears  HAD 2 LOOSE STOOLS YESTERDAY  This appears to be a chronic issue given fecalization on CT  Favor inflammatory process more than appendicitis but can cover with ABX since unclear   Malignancy another possibility  Ischemia unlikely at this point   Needs a colonoscopy at some point since she is due from last one in 2013  once inflammatory process settles down No acute surgical need  currently so clears OK   ID -zosyn 3/31>> VTE -sq herarin FEN -IVF, NPO Foley -none   LOS: 3 days    Turner Daniels MD  03/07/2021

## 2021-03-07 NOTE — Progress Notes (Signed)
PROGRESS NOTE  Nicole Gilbert TIR:[id number] DOB: 1961/09/18   PCP: Berkley Harvey, NP  Patient is from: Home  DOA: 03/04/2021 LOS: 3  Chief complaints: Abdominal pain, nausea and chills  Brief Narrative / Interim history: 60 year old F with PMH of anxiety, chronic constipation, hemorrhoids, GERD, HLD, cesarean section x2 and hysterectomy presenting with nausea, abdominal pain and chills, and admitted with working diagnosis of enterocolitis with unclear etiology.  Vital signs basically normal.  CBC, CMP and UA without significant finding.  She was a started on IV fluid, IV Zosyn, analgesics and antiemetics.  GI and general surgery consulted, and following.   Subjective: Seen and examined earlier this morning.  No major events overnight or this morning.  She reports 4 small watery bowel movements yesterday.  That was after she used MiraLAX.  Denies blood in the stool.  Reports dull pain in RLQ sometimes radiating into the right groin.  Denies nausea or vomiting.  Also reports sense of pressure in her rectum.  Denies UTI symptoms or vaginal discharge.  She thinks her legs are a little swollen.  Objective: Vitals:   03/06/21 1244 03/06/21 2008 03/07/21 0530 03/07/21 1337  BP: 118/80 (!) 139/91 125/83 132/81  Pulse: 68 65 62 67  Resp: 16 20 18 16   Temp: 98.3 F (36.8 C) 97.9 F (36.6 C) 98 F (36.7 C) 98.3 F (36.8 C)  TempSrc:  Oral Oral   SpO2: 100% 100% 100% 100%  Weight:      Height:        Intake/Output Summary (Last 24 hours) at 03/07/2021 1344 Last data filed at 03/07/2021 0900 Gross per 24 hour  Intake 956.67 ml  Output --  Net 956.67 ml   Filed Weights   03/04/21 1628  Weight: 72.6 kg    Examination:  GENERAL: No apparent distress.  Nontoxic.  Sitting on bedside chair. HEENT: MMM.  Vision and hearing grossly intact.  NECK: Supple.  No apparent JVD.  RESP: On RA.  No IWOB.  Fair aeration bilaterally. CVS:  RRR. Heart sounds normal.  ABD/GI/GU: BS+. Abd soft, NTND.   RLQ tenderness with rebound but less intense MSK/EXT:  Moves extremities. No apparent deformity.  Trace edema. SKIN: no apparent skin lesion or wound NEURO: Awake, alert and oriented appropriately.  No apparent focal neuro deficit. PSYCH: Calm. Normal affect.   Procedures:  None  Microbiology summarized: [id number] and influenza PCR nonreactive.  Assessment & Plan: Acute abdominal pain/enterocolitis: unclear etiology.  CT abdomen and pelvis showed mild mucosal thickening and fecalization of the distal ileum with surrounding mild inflammatory change and small amount of free fluid in the RLQ and pelvis raising concern for enterocolitis versus possible ruptured appendicitis.  However, she has no fever, leukocytosis or elevated inflammatory markers . No personal or family history of IBD.  Cannot rule out a malignancy.  Reportedly few polyps resected when she had a colonoscopy.  Abdominal pain improving but continues to have rebound tenderness on my exam. -General surgery and GI following -Discontinue IV fluid.  -Change IV Zosyn to p.o. Augmentin -Advance to full liquid diet  History of chronic constipation-now with diarrhea. -Change MiraLAX to as needed  Hyperlipidemia?  LDL 142.  ASCVD risk score 5.5% -Encourage lifestyle change  Leukopenia/neutropenia: Reportedly chronic issue.  Anemia panel within normal. -May benefit from outpatient hematology evaluation -Continue monitoring  Class I obesity Body mass index is 30.23 kg/m.  -Encourage lifestyle change to lose weight       DVT prophylaxis:  heparin injection 5,000 Units Start: 03/05/21 0600  Code Status: Full code Family Communication: Patient and/or RN. Available if any question.  Level of care: Med-Surg Status is: Inpatient  Remains inpatient appropriate because:Inpatient level of care appropriate due to severity of illness   Dispo: The patient is from: Home              Anticipated d/c is to: Home once cleared by  consultants              Patient currently is not medically stable to d/c.   Difficult to place patient No       Consultants:  General surgery Gastroenterology   Sch Meds:  Scheduled Meds: . amoxicillin-clavulanate  1 tablet Oral Q12H  . heparin  5,000 Units Subcutaneous Q8H  . polyethylene glycol  17 g Oral Daily   Continuous Infusions: . sodium chloride 50 mL/hr at 03/07/21 0954   PRN Meds:.acetaminophen **OR** acetaminophen, fentaNYL (SUBLIMAZE) injection, ondansetron (ZOFRAN) IV  Antimicrobials: Anti-infectives (From admission, onward)   Start     Dose/Rate Route Frequency Ordered Stop   03/07/21 1000  amoxicillin-clavulanate (AUGMENTIN) 875-125 MG per tablet 1 tablet        1 tablet Oral Every 12 hours 03/07/21 0728 03/14/21 0959   03/05/21 0400  piperacillin-tazobactam (ZOSYN) IVPB 3.375 g  Status:  Discontinued        3.375 g 12.5 mL/hr over 240 Minutes Intravenous Every 8 hours 03/04/21 2248 03/07/21 0728   03/04/21 2030  piperacillin-tazobactam (ZOSYN) IVPB 3.375 g        3.375 g 100 mL/hr over 30 Minutes Intravenous  Once 03/04/21 2026 03/04/21 2131       I have personally reviewed the following labs and images: CBC: Recent Labs  Lab 03/04/21 1719 03/05/21 0329 03/06/21 0322 03/07/21 0313  WBC 6.4 3.7* 3.3* 2.8*  NEUTROABS  --   --  0.9*  --   HGB 13.6 12.0 11.1* 11.7*  HCT 42.6 38.2 36.1 37.0  MCV 80.5 82.0 81.9 81.1  PLT 282 249 235 239   BMP &GFR Recent Labs  Lab 03/04/21 1719 03/05/21 0329 03/06/21 0322 03/07/21 0313  NA 139 140 141 140  K 3.9 4.3 3.8 3.6  CL 106 107 108 108  CO2 25 27 26 24   GLUCOSE 114* 104* 67* 87  BUN 11 11 11  6*  CREATININE 0.76 0.77 0.82 0.82  CALCIUM 9.8 9.3 8.7* 8.8*  MG  --   --  1.8 1.8  PHOS  --   --  3.5 3.8   Estimated Creatinine Clearance: 65.6 mL/min (by C-G formula based on SCr of 0.82 mg/dL). Liver & Pancreas: Recent Labs  Lab 03/04/21 1719 03/06/21 0322 03/07/21 0313  AST 22  --   --    ALT 19  --   --   ALKPHOS 48  --   --   BILITOT 1.2  --   --   PROT 7.9  --   --   ALBUMIN 4.1 3.2* 3.4*   Recent Labs  Lab 03/04/21 1719  LIPASE 33   No results for input(s): AMMONIA in the last 168 hours. Diabetic: No results for input(s): HGBA1C in the last 72 hours. Recent Labs  Lab 03/06/21 1242  GLUCAP 112*   Cardiac Enzymes: No results for input(s): CKTOTAL, CKMB, CKMBINDEX, TROPONINI in the last 168 hours. No results for input(s): PROBNP in the last 8760 hours. Coagulation Profile: No results for input(s): INR, PROTIME in the last 168 hours.  Thyroid Function Tests: No results for input(s): TSH, T4TOTAL, FREET4, T3FREE, THYROIDAB in the last 72 hours. Lipid Profile: Recent Labs    03/05/21 0329  CHOL 201*  HDL 50  LDLCALC 142*  TRIG 44  CHOLHDL 4.0   Anemia Panel: Recent Labs    03/07/21 0313  VITAMINB12 892  FOLATE 20.0  FERRITIN 62  TIBC 345  IRON 51  RETICCTPCT 1.8   Urine analysis:    Component Value Date/Time   COLORURINE YELLOW 03/04/2021 1720   APPEARANCEUR CLEAR 03/04/2021 1720   LABSPEC 1.018 03/04/2021 1720   PHURINE 5.0 03/04/2021 1720   GLUCOSEU NEGATIVE 03/04/2021 1720   HGBUR NEGATIVE 03/04/2021 1720   BILIRUBINUR NEGATIVE 03/04/2021 1720   KETONESUR NEGATIVE 03/04/2021 1720   PROTEINUR NEGATIVE 03/04/2021 1720   UROBILINOGEN 0.2 10/07/2015 0520   NITRITE NEGATIVE 03/04/2021 1720   LEUKOCYTESUR NEGATIVE 03/04/2021 1720   Sepsis Labs: Invalid input(s): PROCALCITONIN, [place]  Microbiology: Recent Results (from the past 240 hour(s))  Resp Panel by RT-PCR (Flu A&B, Covid) Nasopharyngeal Swab     Status: None   Collection Time: 03/04/21  8:25 PM   Specimen: Nasopharyngeal Swab; Nasopharyngeal(NP) swabs in vial transport medium  Result Value Ref Range Status   SARS Coronavirus 2 by RT PCR NEGATIVE NEGATIVE Final    Comment: (NOTE) SARS-CoV-2 target nucleic acids are NOT DETECTED.  The SARS-CoV-2 RNA is generally  detectable in upper respiratory specimens during the acute phase of infection. The lowest concentration of SARS-CoV-2 viral copies this assay can detect is 138 copies/mL. A negative result does not preclude SARS-Cov-2 infection and should not be used as the sole basis for treatment or other patient management decisions. A negative result may occur with  improper specimen collection/handling, submission of specimen other than nasopharyngeal swab, presence of viral mutation(s) within the areas targeted by this assay, and inadequate number of viral copies(<138 copies/mL). A negative result must be combined with clinical observations, patient history, and epidemiological information. The expected result is Negative.  Fact Sheet for Patients:  EntrepreneurPulse.com.au  Fact Sheet for Healthcare Providers:  IncredibleEmployment.be  This test is no t yet approved or cleared by the [place] FDA and  has been authorized for detection and/or diagnosis of SARS-CoV-2 by FDA under an Emergency Use Authorization (EUA). This EUA will remain  in effect (meaning this test can be used) for the duration of the COVID-19 declaration under Section 564(b)(1) of the Act, 21 U.S.C.section 360bbb-3(b)(1), unless the authorization is terminated  or revoked sooner.       Influenza A by PCR NEGATIVE NEGATIVE Final   Influenza B by PCR NEGATIVE NEGATIVE Final    Comment: (NOTE) The Xpert Xpress SARS-CoV-2/FLU/RSV plus assay is intended as an aid in the diagnosis of influenza from Nasopharyngeal swab specimens and should not be used as a sole basis for treatment. Nasal washings and aspirates are unacceptable for Xpert Xpress SARS-CoV-2/FLU/RSV testing.  Fact Sheet for Patients: EntrepreneurPulse.com.au  Fact Sheet for Healthcare Providers: IncredibleEmployment.be  This test is not yet approved or cleared by the [place] FDA  and has been authorized for detection and/or diagnosis of SARS-CoV-2 by FDA under an Emergency Use Authorization (EUA). This EUA will remain in effect (meaning this test can be used) for the duration of the COVID-19 declaration under Section 564(b)(1) of the Act, 21 U.S.C. section 360bbb-3(b)(1), unless the authorization is terminated or revoked.  Performed at [facility], [place] [address] [place], [place] [address]     Radiology  Studies: No results found.    Nicole Gilbert T. [place]  If 7PM-7AM, please contact night-coverage [email or web address] 03/07/2021, 1:44 PM

## 2021-03-08 DIAGNOSIS — R933 Abnormal findings on diagnostic imaging of other parts of digestive tract: Secondary | ICD-10-CM | POA: Diagnosis not present

## 2021-03-08 LAB — RENAL FUNCTION PANEL
Albumin: 3.8 g/dL (ref 3.5–5.0)
Anion gap: 9 (ref 5–15)
BUN: 7 mg/dL — ABNORMAL LOW (ref 8–23)
CO2: 24 mmol/L (ref 22–32)
Calcium: 9.5 mg/dL (ref 8.9–10.3)
Chloride: 107 mmol/L (ref 98–111)
Creatinine, Ser: 0.72 mg/dL (ref 0.44–1.00)
GFR, Estimated: >60 mL/min (ref >60)
Glucose, Bld: 80 mg/dL (ref 70–99)
Phosphorus: 3.7 mg/dL (ref 2.5–4.6)
Potassium: 4 mmol/L (ref 3.5–5.1)
Sodium: 140 mmol/L (ref 135–145)

## 2021-03-08 LAB — MAGNESIUM: Magnesium: 1.8 mg/dL (ref 1.7–2.4)

## 2021-03-08 LAB — CBC
HCT: 40.1 % (ref 36.0–46.0)
Hemoglobin: 12.7 g/dL (ref 12.0–15.0)
MCH: 25.6 pg — ABNORMAL LOW (ref 26.0–34.0)
MCHC: 31.7 g/dL (ref 30.0–36.0)
MCV: 80.7 fL (ref 80.0–100.0)
Platelets: 274 10*3/uL (ref 150–400)
RBC: 4.97 MIL/uL (ref 3.87–5.11)
RDW: 14.6 % (ref 11.5–15.5)
WBC: 3.5 10*3/uL — ABNORMAL LOW (ref 4.0–10.5)
nRBC: 0 % (ref 0.0–0.2)

## 2021-03-08 MED ORDER — DICYCLOMINE HCL 10 MG PO CAPS
10.0000 mg | ORAL_CAPSULE | Freq: Two times a day (BID) | ORAL | Status: DC
  Administered 2021-03-08 – 2021-03-09 (×3): 10 mg via ORAL
  Filled 2021-03-08 (×3): qty 1

## 2021-03-08 NOTE — Progress Notes (Addendum)
[facility] Progress Note     Subjective: CC-  Overall improved but does continue to have significant RLQ pain. Pain radiates into her back. Pain was 10/10 on admission, now 4-5/10. Worse with movement or palpation. Mild nausea at times, no emesis. States that the full liquids run right through her. She had 3-4 loose/watery stools yesterday. WBC 3.5, afebrile.  Objective: Vital signs in last 24 hours: Temp:  [98.1 F (36.7 C)-98.4 F (36.9 C)] 98.4 F (36.9 C) (04/04 0513) Pulse Rate:  [62-68] 62 (04/04 0513) Resp:  [16-20] 16 (04/04 0513) BP: (120-132)/(81-84) 123/84 (04/04 0513) SpO2:  [99 %-100 %] 100 % (04/04 0513) Last BM Date: 03/07/21  Intake/Output from previous day: 04/03 0701 - 04/04 0700 In: 540 [P.O.:540] Out: -  Intake/Output this shift: No intake/output data recorded.  PE: Gen:  Alert, NAD, pleasant Pulm: rate and effort normal Abd: Soft, ND, +BS, focal RLQ TTP without rebound or guarding, no peritonitis Psych: A&Ox4  Skin: no rashes noted, warm and dry  Lab Results:  Recent Labs    03/07/21 0313 03/08/21 0312  WBC 2.8* 3.5*  HGB 11.7* 12.7  HCT 37.0 40.1  PLT 239 274   BMET Recent Labs    03/07/21 0313 03/08/21 0312  NA 140 140  K 3.6 4.0  CL 108 107  CO2 24 24  GLUCOSE 87 80  BUN 6* 7*  CREATININE 0.82 0.72  CALCIUM 8.8* 9.5   PT/INR No results for input(s): LABPROT, INR in the last 72 hours. CMP     Component Value Date/Time   NA 140 03/08/2021 0312   K 4.0 03/08/2021 0312   CL 107 03/08/2021 0312   CO2 24 03/08/2021 0312   GLUCOSE 80 03/08/2021 0312   BUN 7 (L) 03/08/2021 0312   CREATININE 0.72 03/08/2021 0312   CALCIUM 9.5 03/08/2021 0312   PROT 7.9 03/04/2021 1719   ALBUMIN 3.8 03/08/2021 0312   AST 22 03/04/2021 1719   ALT 19 03/04/2021 1719   ALKPHOS 48 03/04/2021 1719   BILITOT 1.2 03/04/2021 1719   GFRNONAA >60 03/08/2021 0312   GFRAA >60 09/30/2017 1000   Lipase     Component Value Date/Time    LIPASE 33 03/04/2021 1719       Studies/Results: No results found.  Anti-infectives: Anti-infectives (From admission, onward)   Start     Dose/Rate Route Frequency Ordered Stop   03/07/21 1000  amoxicillin-clavulanate (AUGMENTIN) 875-125 MG per tablet 1 tablet        1 tablet Oral Every 12 hours 03/07/21 0728 03/14/21 0959   03/05/21 0400  piperacillin-tazobactam (ZOSYN) IVPB 3.375 g  Status:  Discontinued        3.375 g 12.5 mL/hr over 240 Minutes Intravenous Every 8 hours 03/04/21 2248 03/07/21 0728   03/04/21 2030  piperacillin-tazobactam (ZOSYN) IVPB 3.375 g        3.375 g 100 mL/hr over 30 Minutes Intravenous  Once 03/04/21 2026 03/04/21 2131       Assessment/Plan Chronic constipation- takes daily metamucil which helps - Last colonoscopy2013 by Dr. Gala Romney in [place] -hyperplastic polyps and tubular adenoma query infectious colitisversus NSAID effect and recommended next colonoscopy September 2018 which was not done  RLQ abdominal pain, nausea/vomiting Enterocolitis  - 3 weeks of upper abdominal pain, acute RLQ pain x1 day with nausea and vomiting - CT scan 3/31 shows nondistended small bowels with evidence of mild mucosal thickening and fecalization of the distal ileum with surrounding mild inflammatory changes and  small amount of free fluid in the right lower quadrant and pelvis; the appendix is not identified; possibleinfectious or inflammatory enterocolitis involving predominantly the distal ileum; radiology also questions possible ruptured appendicitis; no abscess  - unclear diagnosis, seems to be some sort of inflammatory process/ ileitis/gastroenteritis, less likely appendicitis - GI following, planning for appropriate healing with outpatient colonoscopy in 6-8 weeks  ID -zosyn 3/31>>4/3, augmentin 4/3>> VTE -sq herarin FEN -FLD Foley -none  Plan: Overall improved but still with significant RLQ pain. No peritonitis on exam. No elevated WBC and VSS. No  role for acute surgical intervention at this time. Continue antibiotics. Follow up GI recommendations. Will discuss role of possible repeat CT scan with MD.   LOS: 4 days    Wellington Hampshire, [facility] 03/08/2021, 8:18 AM Please see Amion for pager number during day hours 7:00am-4:30pm

## 2021-03-08 NOTE — Progress Notes (Addendum)
     [facility] Progress Note  CC:  RLQ abdominal pain, Abdominal pain and abnormal CT scan  Subjective:  She is passing "slimy" loose brown stools yesterday and today. No bloody stools. She reported passing normal formed stools prior to her hospital admission. She was prescribed Amoxicillin x 10 day for a sinus infection about 2 weeks ago, she reported taking it for only 6  days. She continues to have RLQ pain, no severe pain.   Objective:  Vital signs in last 24 hours: Temp:  [98.1 F (36.7 C)-98.4 F (36.9 C)] 98.4 F (36.9 C) (04/04 0513) Pulse Rate:  [62-68] 62 (04/04 0513) Resp:  [16-20] 16 (04/04 0513) BP: (120-132)/(81-84) 123/84 (04/04 0513) SpO2:  [99 %-100 %] 100 % (04/04 0513) Last BM Date: 03/07/21 General:   Alert 60 year old female in NAD. Heart: RRR, no murmur.  Pulm:  Breath sounds clear throughout.  Abdomen: Mild tenderness above the umbilicus, RLQ and central lower abdomen without rebound or guarding. Lower midline scar intact.  Extremities:  Without edema. Neurologic:  Alert and  oriented x 4. Grossly normal neurologically. Psych:  Alert and cooperative. Normal mood and affect.  Lab Results: Recent Labs    03/06/21 0322 03/07/21 0313 03/08/21 0312  WBC 3.3* 2.8* 3.5*  HGB 11.1* 11.7* 12.7  HCT 36.1 37.0 40.1  PLT 235 239 274   BMET Recent Labs    03/06/21 0322 03/07/21 0313 03/08/21 0312  NA 141 140 140  K 3.8 3.6 4.0  CL 108 108 107  CO2 26 24 24   GLUCOSE 67* 87 80  BUN 11 6* 7*  CREATININE 0.82 0.82 0.72  CALCIUM 8.7* 8.8* 9.5   LFT Recent Labs    03/08/21 0312  ALBUMIN 3.8    Assessment / Plan:  62. 60 year old female admitted to the hospital with nausea and abdominal pain.  CT scan showing mild thickening and inflammatory changes of the distal ileum/enterocolitis, ? infectious or inflammatory in nature. The appendix was not well visualized.  Persistent right lower quadrant abdominal pain.  She was evaluated by  general surgery who felt acute appendicitis was less likely.  She received IV Zosyn and then transitioned to Augmentin po on 4/3.  WBC 3.5.  Normal CRP. She is afebrile. Normal BMs prior to hospital admission. Past 2 days passing "slimy" loose brown stools, likely due to antibiotics.  -Stop Miralax (last dose was 4/3) -Dicyclomine 10mg  one po bid  -If diarrhea persists will need to check C. Diff PCR (patient reported taking Amoxicillin for 5 to 6 days about 2 weeks ago for a sinus infection) -Eventual colonoscopy, in 3-4 weeks or so -Further recommendations per Dr. Ardis Hughs   2. History of colon polyps.     LOS: 4 days   Noralyn Pick  03/08/2021, 9:30 AM  ________________________________________________________________________  [facility] Heckler [facility] MD note:  I personally examined the patient, reviewed the data and agree with the assessment and plan described above.  She continues to slowly improve from acute RLQ process (inflammation vs infection, much less likely neoplasm). Will advance diet to solids for dinner and if she tolerates this overnight then possibly home tomorrow to complete a full 7 days abx course.  She will need eventual colonoscopy, 4-6 weeks from now. She's requesting to change her care from [place] to [place] because she lives in [place].  I'm sure we'll be able to accommodate that.   Owens Loffler, MD [facility] Pager [phone]

## 2021-03-08 NOTE — Progress Notes (Signed)
PROGRESS NOTE  Nicole Gilbert JXB:[id number] DOB: 20-Jul-1961   PCP: Berkley Harvey, NP  Patient is from: Home  DOA: 03/04/2021 LOS: 4  Chief complaints: Abdominal pain, nausea and chills  Brief Narrative / Interim history: 60 year old F with PMH of anxiety, chronic constipation, hemorrhoids, GERD, HLD, cesarean section x2 and hysterectomy presenting with nausea, abdominal pain and chills, and admitted with working diagnosis of enterocolitis with unclear etiology.  Vital signs basically normal.  CBC, CMP and UA without significant finding.  She was a started on IV fluid, IV Zosyn, analgesics and antiemetics.  GI and general surgery consulted, and following.    Subjective: Seen and examined earlier this morning.  Reports 4-5 loose bowel movements over the last 24 hours.  She feels like the food is going through her.  Abdominal pain about the same.  She rates about 4-5 out of 10.  Denies melena or hematochezia.  Denies chest pain, dyspnea, cough or lightheadedness.  Objective: Vitals:   03/07/21 1337 03/07/21 2024 03/08/21 0513 03/08/21 1426  BP: 132/81 120/81 123/84 (!) 132/96  Pulse: 67 68 62 72  Resp: 16 20 16 18   Temp: 98.3 F (36.8 C) 98.1 F (36.7 C) 98.4 F (36.9 C) 98.2 F (36.8 C)  TempSrc:  Oral Oral   SpO2: 100% 99% 100% 99%  Weight:      Height:       No intake or output data in the 24 hours ending 03/08/21 1713 Filed Weights   03/04/21 1628  Weight: 72.6 kg    Examination:  GENERAL: No apparent distress.  Nontoxic. HEENT: MMM.  Vision and hearing grossly intact.  NECK: Supple.  No apparent JVD.  RESP: On RA.  No IWOB.  Fair aeration bilaterally. CVS:  RRR. Heart sounds normal.  ABD/GI/GU: BS+. Abd soft.  Mild RLQ tenderness without significant rebound today. MSK/EXT:  Moves extremities. No apparent deformity. No edema.  SKIN: no apparent skin lesion or wound NEURO: Awake, alert and oriented appropriately.  No apparent focal neuro deficit. PSYCH: Calm. Normal  affect.  Procedures:  None  Microbiology summarized: [id number] and influenza PCR nonreactive.  Assessment & Plan: Acute abdominal pain/enterocolitis: unclear etiology.  CT abdomen and pelvis showed mild mucosal thickening and fecalization of the distal ileum with surrounding mild inflammatory change and small amount of free fluid in the RLQ and pelvis raising concern for enterocolitis versus possible ruptured appendicitis.  However, she has no fever, leukocytosis or elevated inflammatory markers . No personal or family history of IBD.  Cannot rule out a malignancy.  Reportedly few polyps resected when she had a colonoscopy.  Slow improvement from pain standpoint.  -General surgery and GI following -GI advance diet to regular -Change IV Zosyn to p.o. Augmentin -Per GI, can be discharged on 4/5 if she does well on regular diet. -GI to arrange outpatient follow-up  History of chronic constipation-now with diarrhea after a dose of MiraLAX on 4/2 which was her last dose. -Started on Bentyl -Diet advanced to regular  Hyperlipidemia?  LDL 142.  ASCVD risk score 5.5% -Encourage lifestyle change  Leukopenia/neutropenia: Reportedly chronic issue.  Anemia panel within normal.  Improving. -May benefit from outpatient hematology evaluation -Continue monitoring  Class I obesity Body mass index is 30.23 kg/m.  -Encourage lifestyle change to lose weight       DVT prophylaxis:  heparin injection 5,000 Units Start: 03/05/21 0600  Code Status: Full code Family Communication: Patient and/or RN. Available if any question.  Level of care:  Med-Surg Status is: Inpatient  Remains inpatient appropriate because:Inpatient level of care appropriate due to severity of illness   Dispo: The patient is from: Home              Anticipated d/c is to: Home once cleared by GI              Patient currently is not medically stable to d/c.   Difficult to place patient No       Consultants:  General  surgery Gastroenterology   Sch Meds:  Scheduled Meds: . amoxicillin-clavulanate  1 tablet Oral Q12H  . dicyclomine  10 mg Oral BID  . heparin  5,000 Units Subcutaneous Q8H   Continuous Infusions:  PRN Meds:.acetaminophen **OR** acetaminophen, fentaNYL (SUBLIMAZE) injection, ondansetron (ZOFRAN) IV  Antimicrobials: Anti-infectives (From admission, onward)   Start     Dose/Rate Route Frequency Ordered Stop   03/07/21 1000  amoxicillin-clavulanate (AUGMENTIN) 875-125 MG per tablet 1 tablet        1 tablet Oral Every 12 hours 03/07/21 0728 03/14/21 0959   03/05/21 0400  piperacillin-tazobactam (ZOSYN) IVPB 3.375 g  Status:  Discontinued        3.375 g 12.5 mL/hr over 240 Minutes Intravenous Every 8 hours 03/04/21 2248 03/07/21 0728   03/04/21 2030  piperacillin-tazobactam (ZOSYN) IVPB 3.375 g        3.375 g 100 mL/hr over 30 Minutes Intravenous  Once 03/04/21 2026 03/04/21 2131       I have personally reviewed the following labs and images: CBC: Recent Labs  Lab 03/04/21 1719 03/05/21 0329 03/06/21 0322 03/07/21 0313 03/08/21 0312  WBC 6.4 3.7* 3.3* 2.8* 3.5*  NEUTROABS  --   --  0.9*  --   --   HGB 13.6 12.0 11.1* 11.7* 12.7  HCT 42.6 38.2 36.1 37.0 40.1  MCV 80.5 82.0 81.9 81.1 80.7  PLT 282 249 235 239 274   BMP &GFR Recent Labs  Lab 03/04/21 1719 03/05/21 0329 03/06/21 0322 03/07/21 0313 03/08/21 0312  NA 139 140 141 140 140  K 3.9 4.3 3.8 3.6 4.0  CL 106 107 108 108 107  CO2 25 27 26 24 24   GLUCOSE 114* 104* 67* 87 80  BUN 11 11 11  6* 7*  CREATININE 0.76 0.77 0.82 0.82 0.72  CALCIUM 9.8 9.3 8.7* 8.8* 9.5  MG  --   --  1.8 1.8 1.8  PHOS  --   --  3.5 3.8 3.7   Estimated Creatinine Clearance: 67.3 mL/min (by C-G formula based on SCr of 0.72 mg/dL). Liver & Pancreas: Recent Labs  Lab 03/04/21 1719 03/06/21 0322 03/07/21 0313 03/08/21 0312  AST 22  --   --   --   ALT 19  --   --   --   ALKPHOS 48  --   --   --   BILITOT 1.2  --   --   --   PROT  7.9  --   --   --   ALBUMIN 4.1 3.2* 3.4* 3.8   Recent Labs  Lab 03/04/21 1719  LIPASE 33   No results for input(s): AMMONIA in the last 168 hours. Diabetic: No results for input(s): HGBA1C in the last 72 hours. Recent Labs  Lab 03/06/21 1242  GLUCAP 112*   Cardiac Enzymes: No results for input(s): CKTOTAL, CKMB, CKMBINDEX, TROPONINI in the last 168 hours. No results for input(s): PROBNP in the last 8760 hours. Coagulation Profile: No results for input(s): INR, PROTIME  in the last 168 hours. Thyroid Function Tests: No results for input(s): TSH, T4TOTAL, FREET4, T3FREE, THYROIDAB in the last 72 hours. Lipid Profile: No results for input(s): CHOL, HDL, LDLCALC, TRIG, CHOLHDL, LDLDIRECT in the last 72 hours. Anemia Panel: Recent Labs    03/07/21 0313  VITAMINB12 892  FOLATE 20.0  FERRITIN 62  TIBC 345  IRON 51  RETICCTPCT 1.8   Urine analysis:    Component Value Date/Time   COLORURINE YELLOW 03/04/2021 1720   APPEARANCEUR CLEAR 03/04/2021 1720   LABSPEC 1.018 03/04/2021 1720   PHURINE 5.0 03/04/2021 1720   GLUCOSEU NEGATIVE 03/04/2021 1720   HGBUR NEGATIVE 03/04/2021 1720   BILIRUBINUR NEGATIVE 03/04/2021 1720   KETONESUR NEGATIVE 03/04/2021 1720   PROTEINUR NEGATIVE 03/04/2021 1720   UROBILINOGEN 0.2 10/07/2015 0520   NITRITE NEGATIVE 03/04/2021 1720   LEUKOCYTESUR NEGATIVE 03/04/2021 1720   Sepsis Labs: Invalid input(s): PROCALCITONIN, [place]  Microbiology: Recent Results (from the past 240 hour(s))  Resp Panel by RT-PCR (Flu A&B, Covid) Nasopharyngeal Swab     Status: None   Collection Time: 03/04/21  8:25 PM   Specimen: Nasopharyngeal Swab; Nasopharyngeal(NP) swabs in vial transport medium  Result Value Ref Range Status   SARS Coronavirus 2 by RT PCR NEGATIVE NEGATIVE Final    Comment: (NOTE) SARS-CoV-2 target nucleic acids are NOT DETECTED.  The SARS-CoV-2 RNA is generally detectable in upper respiratory specimens during the acute phase of  infection. The lowest concentration of SARS-CoV-2 viral copies this assay can detect is 138 copies/mL. A negative result does not preclude SARS-Cov-2 infection and should not be used as the sole basis for treatment or other patient management decisions. A negative result may occur with  improper specimen collection/handling, submission of specimen other than nasopharyngeal swab, presence of viral mutation(s) within the areas targeted by this assay, and inadequate number of viral copies(<138 copies/mL). A negative result must be combined with clinical observations, patient history, and epidemiological information. The expected result is Negative.  Fact Sheet for Patients:  EntrepreneurPulse.com.au  Fact Sheet for Healthcare Providers:  IncredibleEmployment.be  This test is no t yet approved or cleared by the [place] FDA and  has been authorized for detection and/or diagnosis of SARS-CoV-2 by FDA under an Emergency Use Authorization (EUA). This EUA will remain  in effect (meaning this test can be used) for the duration of the COVID-19 declaration under Section 564(b)(1) of the Act, 21 U.S.C.section 360bbb-3(b)(1), unless the authorization is terminated  or revoked sooner.       Influenza A by PCR NEGATIVE NEGATIVE Final   Influenza B by PCR NEGATIVE NEGATIVE Final    Comment: (NOTE) The Xpert Xpress SARS-CoV-2/FLU/RSV plus assay is intended as an aid in the diagnosis of influenza from Nasopharyngeal swab specimens and should not be used as a sole basis for treatment. Nasal washings and aspirates are unacceptable for Xpert Xpress SARS-CoV-2/FLU/RSV testing.  Fact Sheet for Patients: EntrepreneurPulse.com.au  Fact Sheet for Healthcare Providers: IncredibleEmployment.be  This test is not yet approved or cleared by the [place] FDA and has been authorized for detection and/or diagnosis of SARS-CoV-2  by FDA under an Emergency Use Authorization (EUA). This EUA will remain in effect (meaning this test can be used) for the duration of the COVID-19 declaration under Section 564(b)(1) of the Act, 21 U.S.C. section 360bbb-3(b)(1), unless the authorization is terminated or revoked.  Performed at [facility], [place] [address], [place], [place] [address]     Radiology Studies: No results found.  Jasilyn Holderman T. Pyote  If 7PM-7AM, please contact night-coverage [email or web address] 03/08/2021, 5:13 PM

## 2021-03-09 ENCOUNTER — Telehealth: Payer: Self-pay | Admitting: Nurse Practitioner

## 2021-03-09 DIAGNOSIS — R748 Abnormal levels of other serum enzymes: Secondary | ICD-10-CM

## 2021-03-09 DIAGNOSIS — E669 Obesity, unspecified: Secondary | ICD-10-CM

## 2021-03-09 LAB — PHOSPHORUS: Phosphorus: 3.9 mg/dL (ref 2.5–4.6)

## 2021-03-09 LAB — COMPREHENSIVE METABOLIC PANEL
ALT: 68 U/L — ABNORMAL HIGH (ref 0–44)
AST: 69 U/L — ABNORMAL HIGH (ref 15–41)
Albumin: 3.7 g/dL (ref 3.5–5.0)
Alkaline Phosphatase: 41 U/L (ref 38–126)
Anion gap: 7 (ref 5–15)
BUN: 15 mg/dL (ref 8–23)
CO2: 26 mmol/L (ref 22–32)
Calcium: 9.2 mg/dL (ref 8.9–10.3)
Chloride: 106 mmol/L (ref 98–111)
Creatinine, Ser: 0.84 mg/dL (ref 0.44–1.00)
GFR, Estimated: >60 mL/min (ref >60)
Glucose, Bld: 113 mg/dL — ABNORMAL HIGH (ref 70–99)
Potassium: 4.2 mmol/L (ref 3.5–5.1)
Sodium: 139 mmol/L (ref 135–145)
Total Bilirubin: 0.6 mg/dL (ref 0.3–1.2)
Total Protein: 7 g/dL (ref 6.5–8.1)

## 2021-03-09 LAB — CBC
HCT: 39 % (ref 36.0–46.0)
Hemoglobin: 12.4 g/dL (ref 12.0–15.0)
MCH: 25.9 pg — ABNORMAL LOW (ref 26.0–34.0)
MCHC: 31.8 g/dL (ref 30.0–36.0)
MCV: 81.4 fL (ref 80.0–100.0)
Platelets: 272 10*3/uL (ref 150–400)
RBC: 4.79 MIL/uL (ref 3.87–5.11)
RDW: 14.6 % (ref 11.5–15.5)
WBC: 3.4 10*3/uL — ABNORMAL LOW (ref 4.0–10.5)
nRBC: 0 % (ref 0.0–0.2)

## 2021-03-09 LAB — MAGNESIUM: Magnesium: 1.8 mg/dL (ref 1.7–2.4)

## 2021-03-09 MED ORDER — DICYCLOMINE HCL 10 MG PO CAPS: 10.0000 mg | ORAL_CAPSULE | Freq: Two times a day (BID) | ORAL | 0 refills | Status: DC | PRN

## 2021-03-09 MED ORDER — FLUCONAZOLE 150 MG PO TABS: 150.0000 mg | ORAL_TABLET | Freq: Once | ORAL | 0 refills | Status: AC

## 2021-03-09 MED ORDER — AMOXICILLIN-POT CLAVULANATE 875-125 MG PO TABS: 1.0000 | ORAL_TABLET | Freq: Two times a day (BID) | ORAL | 0 refills | Status: AC

## 2021-03-09 MED ORDER — FLUCONAZOLE 150 MG PO TABS: 150.0000 mg | ORAL_TABLET | Freq: Once | ORAL | 0 refills | Status: DC

## 2021-03-09 MED ORDER — FLUCONAZOLE 150 MG PO TABS
150.0000 mg | ORAL_TABLET | Freq: Once | ORAL | Status: AC
  Administered 2021-03-09: 150 mg via ORAL
  Filled 2021-03-09: qty 1

## 2021-03-09 MED ORDER — AMOXICILLIN-POT CLAVULANATE 875-125 MG PO TABS: 1.0000 | ORAL_TABLET | Freq: Two times a day (BID) | ORAL | 0 refills | Status: DC

## 2021-03-09 NOTE — Plan of Care (Signed)
  Problem: Clinical Measurements: Goal: Will remain free from infection Outcome: Progressing   Problem: Activity: Goal: Risk for activity intolerance will decrease Outcome: Progressing   Problem: Nutrition: Goal: Adequate nutrition will be maintained Outcome: Progressing   Problem: Coping: Goal: Level of anxiety will decrease Outcome: Progressing   Problem: Elimination: Goal: Will not experience complications related to bowel motility Outcome: Progressing

## 2021-03-09 NOTE — Discharge Summary (Signed)
Physician Discharge Summary  Nicole Gilbert:[id number] DOB: 1961/07/28 DOA: 03/04/2021  PCP: Berkley Harvey, NP  Admit date: 03/04/2021 Discharge date: 03/09/2021  Admitted From: Home Disposition: Home  Recommendations for Outpatient Follow-up:  1. Follow ups as below. 2. Please obtain CBC/CMP/Mag at follow up 3. Please follow up on the following pending results: None  Home Health: None required Equipment/Devices: None required  Discharge Condition: Stable CODE STATUS: Full code   Follow-up Information    Zehr, Laban Emperor, PA-C Follow up on 03/26/2021.   Specialty: Gastroenterology Why: Follow up appointment with [name] PA-C at 9 AM on Friday 03/26/2021 Contact information: [place] [address] [phone]                Hospital Course: 60 year old F with PMH of anxiety, chronic constipation, hemorrhoids, GERD, HLD, cesarean section x2 and hysterectomy presenting with nausea, abdominal pain and chills, and admitted with working diagnosis of enterocolitis with unclear etiology.  Vital signs basically normal.  CBC, CMP and UA without significant finding. CT abdomen and pelvis showed mild mucosal thickening and fecalization of the distal ileum with surrounding mild inflammatory change and small amount of free fluid in the RLQ and pelvis raising concern for enterocolitis versus possible ruptured appendicitis She was a started on IV fluid, IV Zosyn, analgesics, antiemetics and bowel rest.   Patient was evaluated by general surgery and gastroenterology.  Appendicitis felt to be unlikely.  Some concern about inflammatory process although inflammatory markers were low.  Eventually, patient symptoms improved.  She was transitioned to p.o. Augmentin.  She tolerated a regular diet.  Cleared for discharge with plan for follow-up with gastroenterology for colonoscopy as above.  See individual problem list below for more on hospital course.   Discharge Diagnoses:   Acute abdominal pain/enterocolitis: unclear etiology.  CT abdomen and pelvis showed mild mucosal thickening and fecalization of the distal ileum with surrounding mild inflammatory change and small amount of free fluid in the RLQ and pelvis raising concern for enterocolitis versus possible ruptured appendicitis.  Evaluated by general surgery.  Appendicitis or peritonitis felt to be less likely. She has no fever, leukocytosis or elevated inflammatory markers.  She has no personal or family history of IBD.  Inflammatory markers were low.  Eventually, patient symptoms improved.  She tolerated regular diet.  Cleared for discharge with a plan to follow-up with gastroenterology for colonoscopy. -Discharged on p.o. Augmentin for 3 more days to complete a total of 7 days -Give p.o. Diflucan 150 mg x 1 for possible vaginal candidiasis followed by another 150 mg in 72 hours if needed -P.o. Bentyl as needed -GI follow-up as above.  Diarrhea: Likely from bowel regimen/MiraLAX.  Improved with regular diet. -Continue Bentyl as needed  Elevated LFT: Mild.  -Recheck CMP at follow-up -Consider further work-up if no improvement  Hyperlipidemia?  LDL 142.  ASCVD risk score 5.5% -Encourage lifestyle change  Leukopenia/neutropenia: Reportedly chronic issue.  Anemia panel within normal. -May benefit from outpatient hematology evaluation  Class I obesity Body mass index is 30.23 kg/m.  -Encourage lifestyle change to lose weight          Discharge Exam: Vitals:   03/08/21 2000 03/09/21 0403  BP: 123/83 122/75  Pulse: 72 76  Resp: 18 18  Temp: 98 F (36.7 C) 98.4 F (36.9 C)  SpO2: 99% 97%    GENERAL: No apparent distress.  Nontoxic. HEENT: MMM.  Vision and hearing grossly intact.  NECK: Supple.  No apparent JVD.  RESP:  No IWOB.  Fair aeration bilaterally. CVS:  RRR. Heart sounds normal.  ABD/GI/GU: Bowel sounds present. Soft.  Mild RUQ tenderness MSK/EXT:  Moves extremities. No apparent  deformity. No edema.  SKIN: no apparent skin lesion or wound NEURO: Awake, alert and oriented appropriately.  No apparent focal neuro deficit. PSYCH: Calm. Normal affect.  Discharge Instructions  Discharge Instructions    Call MD for:  extreme fatigue   Complete by: As directed    Call MD for:  persistant nausea and vomiting   Complete by: As directed    Call MD for:  severe uncontrolled pain   Complete by: As directed    Call MD for:  temperature >100.4   Complete by: As directed    Diet general   Complete by: As directed    Discharge instructions   Complete by: As directed    It has been a pleasure taking care of you!  You were hospitalized for vomiting and abdominal pain.  We treated you with antibiotic with possible infection.  Your symptoms improved to the point we think it is safe to let you go home and continue your medication by mouth, and follow-up with gastroenterology.  Your new medication list and the directions on your medications before you take them.   Take care,   Increase activity slowly   Complete by: As directed      Allergies as of 03/09/2021      Reactions   Morphine And Related Nausea And Vomiting   Shellfish Allergy Hives, Itching      Medication List    STOP taking these medications   Camphor-Menthol-Methyl Sal 1.2-5.7-6.3 % Ptch   omeprazole 20 MG capsule Commonly known as: PRILOSEC   polyethylene glycol-electrolytes 420 g solution Commonly known as: TriLyte     TAKE these medications   amoxicillin-clavulanate 875-125 MG tablet Commonly known as: AUGMENTIN Take 1 tablet by mouth every 12 (twelve) hours for 3 days.   cholecalciferol 25 MCG (1000 UNIT) tablet Commonly known as: VITAMIN D3 Take 2,000 Units by mouth daily.   dicyclomine 10 MG capsule Commonly known as: BENTYL Take 1 capsule (10 mg total) by mouth 2 (two) times daily as needed for spasms.   ELDERBERRY PO Take 2 tablets by mouth daily.   fluconazole 150 MG  tablet Commonly known as: DIFLUCAN Take 1 tablet (150 mg total) by mouth once for 1 dose. Start taking on: March 12, 2021   multivitamin-iron-minerals-folic acid chewable tablet Chew 1 tablet by mouth daily.   psyllium 58.6 % powder Commonly known as: METAMUCIL Take 1 packet by mouth daily.   vitamin B-12 100 MCG tablet Commonly known as: CYANOCOBALAMIN Take 200 mcg by mouth daily.   vitamin C 500 MG tablet Commonly known as: ASCORBIC ACID Take 1,000 mg by mouth daily.       Consultations:  Gastroenterology  General surgery  Procedures/Studies:   CT Abdomen Pelvis W Contrast  Result Date: 03/04/2021 CLINICAL DATA:  Mid abdominal pain and nausea. EXAM: CT ABDOMEN AND PELVIS WITH CONTRAST TECHNIQUE: Multidetector CT imaging of the abdomen and pelvis was performed using the standard protocol following bolus administration of intravenous contrast. CONTRAST:  164mL OMNIPAQUE IOHEXOL 300 MG/ML  SOLN COMPARISON:  September 30, 2017 FINDINGS: Lower chest: No acute abnormality. Hepatobiliary: No focal liver abnormality is seen. No gallstones, gallbladder wall thickening, or biliary dilatation. Pancreas: Unremarkable. No pancreatic ductal dilatation or surrounding inflammatory changes. Spleen: Normal in size without focal abnormality.  Adrenals/Urinary Tract: Adrenal glands are unremarkable. Kidneys are normal, without renal calculi, focal lesion, or hydronephrosis. Bladder is unremarkable. Stomach/Bowel: Normal appearance of the stomach. Nondistended small bowels with evidence of fecalization of the distal ileum the colon is decompressed. The appendix is not identified. Mild inflammatory changes along the distal cecum. Small amount of fluid in the right lower quadrant and in the pelvis. Vascular/Lymphatic: No significant vascular findings are present. No enlarged abdominal or pelvic lymph nodes. Reproductive: Status post hysterectomy. No adnexal masses. Other: No abdominal wall hernia or  abnormality. Musculoskeletal: No acute or significant osseous findings. IMPRESSION: 1. Nondistended small bowels with evidence of mild mucosal thickening and fecalization of the distal ileum, with surrounding mild inflammatory changes and small amount of free fluid in the right lower quadrant and pelvis. 2. The appendix is not identified. Has the patient had an appendectomy? 3. Differential diagnosis includes infectious or inflammatory enterocolitis involving predominantly the distal ileum. Alternatively, if the patient has not had an appendectomy, this could potentially represent ruptured appendicitis with reactive inflammatory changes of the adjacent bowel loops. No evidence of abscess formation. Please correlate clinically. 4. No evidence of acute abnormalities within the solid abdominal organs. Electronically Signed   By: Fidela Salisbury M.D.   On: 03/04/2021 19:58        The results of significant diagnostics from this hospitalization (including imaging, microbiology, ancillary and laboratory) are listed below for reference.     Microbiology: Recent Results (from the past 240 hour(s))  Resp Panel by RT-PCR (Flu A&B, Covid) Nasopharyngeal Swab     Status: None   Collection Time: 03/04/21  8:25 PM   Specimen: Nasopharyngeal Swab; Nasopharyngeal(NP) swabs in vial transport medium  Result Value Ref Range Status   SARS Coronavirus 2 by RT PCR NEGATIVE NEGATIVE Final    Comment: (NOTE) SARS-CoV-2 target nucleic acids are NOT DETECTED.  The SARS-CoV-2 RNA is generally detectable in upper respiratory specimens during the acute phase of infection. The lowest concentration of SARS-CoV-2 viral copies this assay can detect is 138 copies/mL. A negative result does not preclude SARS-Cov-2 infection and should not be used as the sole basis for treatment or other patient management decisions. A negative result may occur with  improper specimen collection/handling, submission of specimen  other than nasopharyngeal swab, presence of viral mutation(s) within the areas targeted by this assay, and inadequate number of viral copies(<138 copies/mL). A negative result must be combined with clinical observations, patient history, and epidemiological information. The expected result is Negative.  Fact Sheet for Patients:  EntrepreneurPulse.com.au  Fact Sheet for Healthcare Providers:  IncredibleEmployment.be  This test is no t yet approved or cleared by the [place] FDA and  has been authorized for detection and/or diagnosis of SARS-CoV-2 by FDA under an Emergency Use Authorization (EUA). This EUA will remain  in effect (meaning this test can be used) for the duration of the COVID-19 declaration under Section 564(b)(1) of the Act, 21 U.S.C.section 360bbb-3(b)(1), unless the authorization is terminated  or revoked sooner.       Influenza A by PCR NEGATIVE NEGATIVE Final   Influenza B by PCR NEGATIVE NEGATIVE Final    Comment: (NOTE) The Xpert Xpress SARS-CoV-2/FLU/RSV plus assay is intended as an aid in the diagnosis of influenza from Nasopharyngeal swab specimens and should not be used as a sole basis for treatment. Nasal washings and aspirates are unacceptable for Xpert Xpress SARS-CoV-2/FLU/RSV testing.  Fact Sheet for Patients: EntrepreneurPulse.com.au  Fact Sheet for Healthcare Providers: IncredibleEmployment.be  This test is not yet approved or cleared by the [place] and has been authorized for detection and/or diagnosis of SARS-CoV-2 by FDA under an Emergency Use Authorization (EUA). This EUA will remain in effect (meaning this test can be used) for the duration of the COVID-19 declaration under Section 564(b)(1) of the Act, 21 U.S.C. section 360bbb-3(b)(1), unless the authorization is terminated or revoked.  Performed at [facility], [place] [address], [place], [place] [address]      Labs:  CBC: Recent Labs  Lab 03/05/21 [phone] 03/06/21 0322 03/07/21 0313 03/08/21 0312 03/09/21 0406  WBC 3.7* 3.3* 2.8* 3.5* 3.4*  NEUTROABS  --  0.9*  --   --   --   HGB 12.0 11.1* 11.7* 12.7 12.4  HCT 38.2 36.1 37.0 40.1 39.0  MCV 82.0 81.9 81.1 80.7 81.4  PLT 249 235 239 274 272   BMP &GFR Recent Labs  Lab 03/05/21 0329 03/06/21 0322 03/07/21 0313 03/08/21 0312 03/09/21 0406  NA 140 141 140 140 139  K 4.3 3.8 3.6 4.0 4.2  CL 107 108 108 107 106  CO2 27 26 24 24 26   GLUCOSE 104* 67* 87 80 113*  BUN 11 11 6* 7* 15  CREATININE 0.77 0.82 0.82 0.72 0.84  CALCIUM 9.3 8.7* 8.8* 9.5 9.2  MG  --  1.8 1.8 1.8 1.8  PHOS  --  3.5 3.8 3.7 3.9   Estimated Creatinine Clearance: 64.1 mL/min (by C-G formula based on SCr of 0.84 mg/dL). Liver & Pancreas: Recent Labs  Lab 03/04/21 1719 03/06/21 0322 03/07/21 0313 03/08/21 0312 03/09/21 0406  AST 22  --   --   --  69*  ALT 19  --   --   --  68*  ALKPHOS 48  --   --   --  41  BILITOT 1.2  --   --   --  0.6  PROT 7.9  --   --   --  7.0  ALBUMIN 4.1 3.2* 3.4* 3.8 3.7   Recent Labs  Lab 03/04/21 1719  LIPASE 33   No results for input(s): AMMONIA in the last 168 hours. Diabetic: No results for input(s): HGBA1C in the last 72 hours. Recent Labs  Lab 03/06/21 1242  GLUCAP 112*   Cardiac Enzymes: No results for input(s): CKTOTAL, CKMB, CKMBINDEX, TROPONINI in the last 168 hours. No results for input(s): PROBNP in the last 8760 hours. Coagulation Profile: No results for input(s): INR, PROTIME in the last 168 hours. Thyroid Function Tests: No results for input(s): TSH, T4TOTAL, FREET4, T3FREE, THYROIDAB in the last 72 hours. Lipid Profile: No results for input(s): CHOL, HDL, LDLCALC, TRIG, CHOLHDL, LDLDIRECT in the last 72 hours. Anemia Panel: Recent Labs    03/07/21 0313  VITAMINB12 892  FOLATE 20.0  FERRITIN 62  TIBC 345  IRON 51  RETICCTPCT 1.8   Urine analysis:     Component Value Date/Time   COLORURINE YELLOW 03/04/2021 1720   APPEARANCEUR CLEAR 03/04/2021 1720   LABSPEC 1.018 03/04/2021 1720   PHURINE 5.0 03/04/2021 1720   GLUCOSEU NEGATIVE 03/04/2021 1720   HGBUR NEGATIVE 03/04/2021 1720   BILIRUBINUR NEGATIVE 03/04/2021 1720   KETONESUR NEGATIVE 03/04/2021 1720   PROTEINUR NEGATIVE 03/04/2021 1720   UROBILINOGEN 0.2 10/07/2015 0520   NITRITE NEGATIVE 03/04/2021 1720   LEUKOCYTESUR NEGATIVE 03/04/2021 1720   Sepsis Labs: Invalid input(s): PROCALCITONIN, LACTICIDVEN   Time coordinating discharge: 40 minutes  SIGNED:  Mercy Riding, MD  [facility] 03/09/2021, 5:09 PM  If 7PM-7AM, please contact night-coverage [email or web address]

## 2021-03-09 NOTE — Progress Notes (Signed)
[facility] Progress Note     Subjective: CC-  Up in chair. States that she is feeling much better today. She is only having 1-2/10 pain in the RLQ. Denies n/v. Tolerating diet. Diarrhea also improved, 1 loose stool yesterday and 1 today.  Objective: Vital signs in last 24 hours: Temp:  [98 F (36.7 C)-98.4 F (36.9 C)] 98.4 F (36.9 C) (04/05 0403) Pulse Rate:  [72-76] 76 (04/05 0403) Resp:  [18] 18 (04/05 0403) BP: (122-132)/(75-96) 122/75 (04/05 0403) SpO2:  [97 %-99 %] 97 % (04/05 0403) Last BM Date: 03/08/21  Intake/Output from previous day: No intake/output data recorded. Intake/Output this shift: No intake/output data recorded.  PE: Gen:  Alert, NAD, pleasant Pulm: rate and effort normal Abd: Soft, ND, +BS, mild RLQ TTP without rebound or guarding, no peritonitis Psych: A&Ox4  Skin: no rashes noted, warm and dry  Lab Results:  Recent Labs    03/08/21 0312 03/09/21 0406  WBC 3.5* 3.4*  HGB 12.7 12.4  HCT 40.1 39.0  PLT 274 272   BMET Recent Labs    03/08/21 0312 03/09/21 0406  NA 140 139  K 4.0 4.2  CL 107 106  CO2 24 26  GLUCOSE 80 113*  BUN 7* 15  CREATININE 0.72 0.84  CALCIUM 9.5 9.2   PT/INR No results for input(s): LABPROT, INR in the last 72 hours. CMP     Component Value Date/Time   NA 139 03/09/2021 0406   K 4.2 03/09/2021 0406   CL 106 03/09/2021 0406   CO2 26 03/09/2021 0406   GLUCOSE 113 (H) 03/09/2021 0406   BUN 15 03/09/2021 0406   CREATININE 0.84 03/09/2021 0406   CALCIUM 9.2 03/09/2021 0406   PROT 7.0 03/09/2021 0406   ALBUMIN 3.7 03/09/2021 0406   AST 69 (H) 03/09/2021 0406   ALT 68 (H) 03/09/2021 0406   ALKPHOS 41 03/09/2021 0406   BILITOT 0.6 03/09/2021 0406   GFRNONAA >60 03/09/2021 0406   GFRAA >60 09/30/2017 1000   Lipase     Component Value Date/Time   LIPASE 33 03/04/2021 1719       Studies/Results: No results found.  Anti-infectives: Anti-infectives (From admission, onward)   Start      Dose/Rate Route Frequency Ordered Stop   03/12/21 0000  fluconazole (DIFLUCAN) 150 MG tablet  Status:  Discontinued        150 mg Oral  Once 03/09/21 0922 03/09/21    03/12/21 0000  fluconazole (DIFLUCAN) 150 MG tablet        150 mg Oral  Once 03/09/21 0951 03/12/21 2359   03/09/21 1030  fluconazole (DIFLUCAN) tablet 150 mg        150 mg Oral  Once 03/09/21 0919     03/09/21 0000  amoxicillin-clavulanate (AUGMENTIN) 875-125 MG tablet  Status:  Discontinued        1 tablet Oral Every 12 hours 03/09/21 0922 03/09/21    03/09/21 0000  amoxicillin-clavulanate (AUGMENTIN) 875-125 MG tablet        1 tablet Oral Every 12 hours 03/09/21 0951 03/12/21 2359   03/07/21 1000  amoxicillin-clavulanate (AUGMENTIN) 875-125 MG per tablet 1 tablet        1 tablet Oral Every 12 hours 03/07/21 0728 03/14/21 0959   03/05/21 0400  piperacillin-tazobactam (ZOSYN) IVPB 3.375 g  Status:  Discontinued        3.375 g 12.5 mL/hr over 240 Minutes Intravenous Every 8 hours 03/04/21 2248 03/07/21 0728   03/04/21 2030  piperacillin-tazobactam (ZOSYN) IVPB 3.375 g        3.375 g 100 mL/hr over 30 Minutes Intravenous  Once 03/04/21 2026 03/04/21 2131       Assessment/Plan Chronic constipation- takes daily metamucil which helps - Last colonoscopy2013 by Dr. Gala Romney in [place] -hyperplastic polyps and tubular adenoma query infectious colitisversus NSAID effect and recommended next colonoscopy September 2018 which was not done  RLQ abdominal pain, nausea/vomiting Enterocolitis  - 3 weeks of upper abdominal pain, acute RLQ pain x1 day with nausea and vomiting - CT scan 3/31 shows nondistended small bowels with evidence of mild mucosal thickening and fecalization of the distal ileum with surrounding mild inflammatory changes and small amount of free fluid in the right lower quadrant and pelvis; the appendix is not identified; possibleinfectious or inflammatory enterocolitis involving predominantly the distal ileum;  radiology also questions possible ruptured appendicitis; no abscess - unclear diagnosis, seems to be some sort of inflammatory process/ ileitis/gastroenteritis, less likely appendicitis - GI following, planning for appropriate healing with outpatient colonoscopy in 6-8 weeks  ID -zosyn 3/31>>4/3, augmentin 4/3>> VTE -sq herarin FEN - reg diet Foley -none  Plan: Abdominal pain nearly resolved, tolerating diet, diarrhea improved. No role for acute surgical intervention. Plan per GI: patient is going home today on augmentin to complete 7 day course. She will follow up in their office in a couple weeks and plan for colonoscopy in 6-8 weeks.   LOS: 5 days    [facility] 03/09/2021, 10:02 AM Please see Amion for pager number during day hours 7:00am-4:30pm

## 2021-03-09 NOTE — Progress Notes (Addendum)
[facility] Progress Note  CC:  RLQ abdominal pain, Abdominal pain and abnormal CT scan  Subjective:  She feels well this morning and she wants to go home as planned. She is tolerated a regular diet.  Less RLQ abdomina pain this am, rates pain 1 or 2 on scale of 1 to 10. She took Dicyclomine 7m po yesterday x 2 which made her feel a little light headed. She took Dicyclomine this am without feeling light headed. She passed a small amount of loose brown nonbloody stool this am.    Objective:  Vital signs in last 24 hours: Temp:  [98 F (36.7 C)-98.4 F (36.9 C)] 98.4 F (36.9 C) (04/05 0403) Pulse Rate:  [72-76] 76 (04/05 0403) Resp:  [18] 18 (04/05 0403) BP: (122-132)/(75-96) 122/75 (04/05 0403) SpO2:  [97 %-99 %] 97 % (04/05 0403) Last BM Date: 03/08/21 General:   Alert in NAD. Heart: RRR, no murmur.  Pulm:  Breath sounds clear throughout.  Abdomen: Mild epigastric and RLQ tenderness without rebound or guarding. Nondistended. + BS x 4 quads.  Extremities:  Without edema. Neurologic:  Alert and  oriented x 4. Grossly normal neurologically. Psych:  Alert and cooperative. Normal mood and affect.  Intake/Output from previous day: No intake/output data recorded. Intake/Output this shift: No intake/output data recorded.  Lab Results: Recent Labs    03/07/21 0313 03/08/21 0312 03/09/21 0406  WBC 2.8* 3.5* 3.4*  HGB 11.7* 12.7 12.4  HCT 37.0 40.1 39.0  PLT 239 274 272   BMET Recent Labs    03/07/21 0313 03/08/21 0312 03/09/21 0406  NA 140 140 139  K 3.6 4.0 4.2  CL 108 107 106  CO2 _0 GLUCOSE 87 80 113*  BUN 6* 7* 15  CREATININE 0.82 0.72 0.84  CALCIUM 8.8* 9.5 9.2   LFT Recent Labs    03/09/21 0406  PROT 7.0  ALBUMIN 3.7  AST 69*  ALT 68*  ALKPHOS 41  BILITOT 0.6   PT/INR No results for input(s): LABPROT, INR in the last 72 hours. Hepatitis Panel No results for input(s): HEPBSAG, HCVAB, HEPAIGM, HEPBIGM in the last 72  hours.  No results found.  Assessment / Plan:  177 60year old femaleadmitted to the hospital with nausea and abdominal pain.  CT scan showing mild thickening and inflammatory changes of the distal ileum/enterocolitis, infectious vs inflammatory etiology. The appendix was not well visualized.  Persistent right lower quadrant abdominal pain.  She was evaluated by general surgery who felt acute appendicitis was less likely.  She received IV Zosyn and then transitioned to Augmentin po on 4/3.  WBC 3.5.  Normal CRP. She is afebrile. Normal BMs prior to hospital admission. She passed a few loose stools yesterday and a small loose nonbloody stool this am, likely due to antibiotics. Miralax was discontinued.  Her abdominal pain has significantly improved over the past 24 hours.  -Ok to discharge home today -Dicyclomine 183mone po bid PRN, stop if lightheadedness recurs after taking Dicyclomine  -Continue Augmentin [id number] bid to complete a total of 7 days of antibiotics -Eventual colonoscopy in 4 to 6 weeks -Patient [place]. Out patient GI follow up in our office scheduled on 03/26/2021 at 9am with JesAlonza Bogus-C.  -Patient to call our office if loose stools or abdominal pain worsens   2. Mildly elevated LFTs, most likely secondary to antibiotics.  AST 69. ALT 68. Normal alk phos and T. Bili.  -Repeat hepatic panel in 2 weeks, our office will contact patient to facilitate lab order  3. History of colon polyps -See plan in # 1    Principal Problem:   Abdominal pain Active Problems:   Enterocolitis   HLD (hyperlipidemia)   Abnormal finding on GI tract imaging     LOS: 5 days   Noralyn Pick  03/09/2021, 9:16 AM

## 2021-03-09 NOTE — Telephone Encounter (Signed)
Beth, pls contact the patient and provide her with a lab order to check a hepatic panel in 2 weeks. Her LFTs were mildly elevated today prior to discharge from [facility]  which is most likely from being on Augmentin. THX.

## 2021-03-16 ENCOUNTER — Other Ambulatory Visit: Payer: Self-pay

## 2021-03-16 DIAGNOSIS — R7989 Other specified abnormal findings of blood chemistry: Secondary | ICD-10-CM

## 2021-03-16 NOTE — Telephone Encounter (Signed)
Patient contacted and agrees to come for her labs on 03/23/21 or 03/24/21. She is scheduled for an appointment for follow up with Jessica on 03/26/21. I ordered the labs in your name.

## 2021-03-24 ENCOUNTER — Other Ambulatory Visit (INDEPENDENT_AMBULATORY_CARE_PROVIDER_SITE_OTHER): Payer: PRIVATE HEALTH INSURANCE

## 2021-03-24 DIAGNOSIS — R7989 Other specified abnormal findings of blood chemistry: Secondary | ICD-10-CM

## 2021-03-24 LAB — HEPATIC FUNCTION PANEL
ALT: 14 U/L (ref 0–35)
AST: 17 U/L (ref 0–37)
Albumin: 4 g/dL (ref 3.5–5.2)
Alkaline Phosphatase: 41 U/L (ref 39–117)
Bilirubin, Direct: 0.1 mg/dL (ref 0.0–0.3)
Total Bilirubin: 0.6 mg/dL (ref 0.2–1.2)
Total Protein: 7.4 g/dL (ref 6.0–8.3)

## 2021-03-26 ENCOUNTER — Ambulatory Visit: Payer: BC Managed Care – PPO | Admitting: Gastroenterology

## 2021-03-26 ENCOUNTER — Other Ambulatory Visit: Payer: Self-pay

## 2021-03-26 ENCOUNTER — Encounter: Payer: Self-pay | Admitting: Gastroenterology

## 2021-03-26 VITALS — BP 130/78 | HR 73 | Ht 61.0 in | Wt 163.0 lb

## 2021-03-26 DIAGNOSIS — R935 Abnormal findings on diagnostic imaging of other abdominal regions, including retroperitoneum: Secondary | ICD-10-CM | POA: Diagnosis not present

## 2021-03-26 DIAGNOSIS — R1031 Right lower quadrant pain: Secondary | ICD-10-CM | POA: Diagnosis not present

## 2021-03-26 DIAGNOSIS — Z860100 Personal history of colon polyps, unspecified: Secondary | ICD-10-CM | POA: Insufficient documentation

## 2021-03-26 MED ORDER — CLENPIQ 10-3.5-12 MG-GM -GM/160ML PO SOLN: 1.0000 | Freq: Once | ORAL | 0 refills | Status: AC

## 2021-03-26 NOTE — Progress Notes (Signed)
03/26/2021 Nicole Gilbert [id number] 08/27/1961   HISTORY OF PRESENT ILLNESS:  This is a 60 year old femaleadmitted to the hospital at the end of March with nausea and abdominal pain.CT scan showing mild thickening and inflammatory changes of the distal ileum/enterocolitis, infectious vs inflammatory etiology. The appendix was not well visualized. Persistent right lower quadrant abdominal pain. She was evaluated by general surgery who felt acute appendicitis was less likely. She received IV Zosyn and then transitioned to Augmentin po on4/3.WBC 3.5. Normal CRP.  She was treated conservatively and was told to follow-up as an outpatient for colonoscopy.  She tells me that she still as RLQ abdominal pain that comes and goes, but is present every day.  Says that she feels like she has to strain to move her bowels but then when they come out they are not hard.  She takes Metamucil gummies daily.  She denies seeing blood in her stools.  She has not been using her dicyclomine.  CT scan of the abdomen and pelvis with contrast showed the following:  IMPRESSION: 1. Nondistended small bowels with evidence of mild mucosal thickening and fecalization of the distal ileum, with surrounding mild inflammatory changes and small amount of free fluid in the right lower quadrant and pelvis. 2. The appendix is not identified. Has the patient had an appendectomy? 3. Differential diagnosis includes infectious or inflammatory enterocolitis involving predominantly the distal ileum. Alternatively, if the patient has not had an appendectomy, this could potentially represent ruptured appendicitis with reactive inflammatory changes of the adjacent bowel loops. No evidence of abscess formation. Please correlate clinically. 4. No evidence of acute abnormalities within the solid abdominal Organs.  Last colonoscopy2013 by Dr. Gala Romney in [place] -hyperplastic polyps and tubular adenoma.  Query infectious  colitis versus NSAID effect on colonoscopy report, but biopsies were all unremarkable.  Was supposed to have another colonoscopy in 2018.  Past Medical History:  Diagnosis Date  . Anxiety   . Chronic constipation   . GERD (gastroesophageal reflux disease)   . Hemorrhoids 2006  . Hyperlipemia   . Insomnia    Past Surgical History:  Procedure Laterality Date  . ABDOMINAL HYSTERECTOMY    . BIOPSY  06/25/2014   Dr. Rourk:malignant features are not identified  . Carpal tunnel right hand    . CESAREAN SECTION     x2  . COLONOSCOPY   05/19/2008   Rourk-Prominent internal hemorrhoids and anal papilla, otherwise normal appearing rectal mucosa / Colonic mucosa appeared normal except for melanosis coli  . COLONOSCOPY  08/22/2012   Rourk- hyperplastic polyps and tubular adenoma, query infectious colitis vs NSAID effect. Next TCS 08/2017.  Marland Kitchen ESOPHAGOGASTRODUODENOSCOPY   01/20/2005   Rourk- Normal-appearing esophagus, status post passage of a 66 French Maloney  dilator/ small HH/  Otherwise normal stomach  . ESOPHAGOGASTRODUODENOSCOPY N/A 02/06/2013   LPF:XTKWIO esophagus. Small hiatal hernia. Antral erosions: bx no h.pylori.  . ESOPHAGOGASTRODUODENOSCOPY N/A 06/25/2014   Dr. Myra Gianotti nodule-status post biopsy; otherwise, normalEGD. No endoscopic explanation for patient's symptoms  . HEMORRHOID SURGERY  06/30/2008   Dr Arnoldo Morale  . KNEE ARTHROSCOPY     2010, RIGHT KNEE     reports that she has never smoked. She has never used smokeless tobacco. She reports current alcohol use. She reports that she does not use drugs. family history includes Colon cancer (age of onset: 8) in her brother; Coronary artery disease in her maternal grandmother; Diabetes in her father; Lung cancer in an other family member.  Allergies  Allergen Reactions  . Morphine And Related Nausea And Vomiting  . Shellfish Allergy Hives and Itching      Outpatient Encounter Medications as of 03/26/2021  Medication Sig  .  cholecalciferol (VITAMIN D3) 25 MCG (1000 UNIT) tablet Take 2,000 Units by mouth daily.  Marland Kitchen dicyclomine (BENTYL) 10 MG capsule Take 1 capsule (10 mg total) by mouth 2 (two) times daily as needed for spasms.  Marland Kitchen ELDERBERRY PO Take 2 tablets by mouth daily.  . multivitamin-iron-minerals-folic acid (CENTRUM) chewable tablet Chew 1 tablet by mouth daily.  . Probiotic CHEW 2 chews daily  . Probiotic Product (PROBIOTIC PO) Take by mouth.  . psyllium (METAMUCIL) 58.6 % powder Take 1 packet by mouth daily.  . vitamin B-12 (CYANOCOBALAMIN) 100 MCG tablet Take 200 mcg by mouth daily.  . vitamin C (ASCORBIC ACID) 500 MG tablet Take 1,000 mg by mouth daily.   No facility-administered encounter medications on file as of 03/26/2021.     REVIEW OF SYSTEMS  : All other systems reviewed and negative except where noted in the History of Present Illness.   PHYSICAL EXAM: BP 130/78   Pulse 73   Ht 5\' 1"  (1.549 m)   Wt 163 lb (73.9 kg)   BMI 30.80 kg/m  General: Well developed AA female in no acute distress Head: Normocephalic and atraumatic Eyes:  Sclerae anicteric, conjunctiva pink. Ears: Normal auditory acuity Lungs: Clear throughout to auscultation; no W/R/R. Heart: Regular rate and rhythm; no M/R/G. Abdomen: Soft, non-distended.  BS present.  Mild diffuse TTP, more so on the right. Rectal:  Will be done at the time of colonoscopy. Musculoskeletal: Symmetrical with no gross deformities  Skin: No lesions on visible extremities Extremities: No edema  Neurological: Alert oriented x 4, grossly non-focal Psychological:  Alert and cooperative. Normal mood and affect  ASSESSMENT AND PLAN: *60 year old femaleadmitted to the hospital recently with nausea and abdominal pain.CT scan showing mild thickening and inflammatory changes of the distal ileum/enterocolitis, infectious vs inflammatory etiology. The appendix was not well visualized. Persistent right lower quadrant abdominal pain. She was evaluated  by general surgery who felt acute appendicitis was less likely. She received IV Zosyn and then transitioned to Augmentin po on4/3.WBC 3.5. Normal CRP.  Pain is persistent in the RLQ but intermittent.  Has not been using dicyclomine.  Plan was for outpatient colonoscopy.  Will schedule with Dr. Bryan Lemma.  The risks, benefits, and alternatives to colonoscopy were discussed with the patient and she consents to proceed.  Can try dicyclomine twice daily in the interim if tolerated.  She is taking Metamucil gummies, but will try Benefiber powder, 2 tsp in 8 ounces of liquid daily instead to see if that helps to bulk her stools. *Mildly elevated LFTs:  Normalized on repeat labs, likely secondary to antibiotics that she was on. *History of colon polyps:  Last colonoscopy2013 by Dr. Gala Romney in [place] -hyperplastic polyps and tubular adenoma.  Query infectious colitis versus NSAID effect on colonoscopy report, but biopsies were all unremarkable.  Was supposed to have another colonoscopy in 2018.  CC:  Berkley Harvey, NP

## 2021-03-26 NOTE — Patient Instructions (Signed)
If you are age 62 or older, your body mass index should be between 23-30. Your Body mass index is 30.8 kg/m. If this is out of the aforementioned range listed, please consider follow up with your Primary Care Provider.  If you are age 35 or younger, your body mass index should be between 19-25. Your Body mass index is 30.8 kg/m. If this is out of the aformentioned range listed, please consider follow up with your Primary Care Provider.   You have been scheduled for a colonoscopy. Please follow written instructions given to you at your visit today.  Please pick up your prep supplies at the pharmacy within the next 1-3 days. If you use inhalers (even only as needed), please bring them with you on the day of your procedure.   Take Dicyclomine twice daily.  Take Benefiber - 2 teaspoons in 8 ounces of liquid daily

## 2021-03-30 NOTE — Progress Notes (Signed)
Agree with the assessment and plan as outlined by Jessica Zehr, PA-C. ? ?Jameela Michna, DO, FACG ? ?

## 2021-04-14 ENCOUNTER — Ambulatory Visit: Payer: PRIVATE HEALTH INSURANCE | Admitting: Nurse Practitioner

## 2021-04-21 ENCOUNTER — Ambulatory Visit (AMBULATORY_SURGERY_CENTER): Payer: BC Managed Care – PPO | Admitting: Gastroenterology

## 2021-04-21 ENCOUNTER — Encounter: Payer: Self-pay | Admitting: Gastroenterology

## 2021-04-21 ENCOUNTER — Other Ambulatory Visit: Payer: Self-pay

## 2021-04-21 VITALS — BP 131/74 | HR 77 | Temp 97.5°F | Resp 14 | Ht 61.0 in | Wt 163.0 lb

## 2021-04-21 DIAGNOSIS — K641 Second degree hemorrhoids: Secondary | ICD-10-CM | POA: Diagnosis not present

## 2021-04-21 DIAGNOSIS — R935 Abnormal findings on diagnostic imaging of other abdominal regions, including retroperitoneum: Secondary | ICD-10-CM

## 2021-04-21 DIAGNOSIS — R1031 Right lower quadrant pain: Secondary | ICD-10-CM

## 2021-04-21 DIAGNOSIS — Z86010 Personal history of colonic polyps: Secondary | ICD-10-CM

## 2021-04-21 MED ORDER — SODIUM CHLORIDE 0.9 % IV SOLN: 500.0000 mL | Freq: Once | INTRAVENOUS | Status: DC

## 2021-04-21 NOTE — Op Note (Signed)
[facility] Patient Name: Nicole Gilbert Procedure Date: 04/21/2021 9:20 AM MRN: [id number] Endoscopist: Gerrit Heck , MD Age: 60 Referring MD:  Date of Birth: [birth year] Gender: Female Account #: [id number] Procedure:                Colonoscopy Indications:              Abdominal pain in the right lower quadrant,                            Abnormal CT of the GI tract Medicines:                Monitored Anesthesia Care Procedure:                Pre-Anesthesia Assessment:                           - Prior to the procedure, a History and Physical                            was performed, and patient medications and                            allergies were reviewed. The patient's tolerance of                            previous anesthesia was also reviewed. The risks                            and benefits of the procedure and the sedation                            options and risks were discussed with the patient.                            All questions were answered, and informed consent                            was obtained. Prior Anticoagulants: The patient has                            taken no previous anticoagulant or antiplatelet                            agents. ASA Grade Assessment: II - A patient with                            mild systemic disease. After reviewing the risks                            and benefits, the patient was deemed in                            satisfactory condition to undergo the procedure.  After obtaining informed consent, the colonoscope                            was passed under direct vision. Throughout the                            procedure, the patient's blood pressure, pulse, and                            oxygen saturations were monitored continuously. The                            Olympus CF-HQ190L [id number]) Colonoscope was                            introduced through the anus and advanced to  the the                            terminal ileum. The colonoscopy was performed                            without difficulty. The patient tolerated the                            procedure well. The quality of the bowel                            preparation was good. The terminal ileum, ileocecal                            valve, appendiceal orifice, and rectum were                            photographed. Scope In: 9:33:43 AM Scope Out: 10:01:32 AM Scope Withdrawal Time: 0 hours 21 minutes 41 seconds  Total Procedure Duration: 0 hours 27 minutes 49 seconds  Findings:                 The perianal and digital rectal examinations were                            normal.                           The ascending colon revealed significantly                            excessive looping. Advancing the scope required                            using manual pressure.                           The colon appeared normal throughout. No areas of  mucosal erythema, edema, ulceration, or erosions                            noted.                           A scar was found in the distal rectum consistent                            with prior hemorrhoid surgery.                           Non-bleeding internal hemorrhoids were found during                            retroflexion. The hemorrhoids were small and Grade                            II (internal hemorrhoids that prolapse but reduce                            spontaneously).                           The terminal ileum appeared normal. Due to                            significant looping, was only able to advance 5 cm                            into the ileum, but the mucosa was otherwise normal                            appearing. Complications:            No immediate complications. Estimated Blood Loss:     Estimated blood loss: none. Impression:               - There was significant looping of the colon.                            - The entire examined colon is normal.                           - Scar in the distal rectum.                           - Non-bleeding internal hemorrhoids.                           - The examined portion of the ileum was normal.                           - No specimens collected. Recommendation:           - Patient has a contact number available for  emergencies. The signs and symptoms of potential                            delayed complications were discussed with the                            patient. Return to normal activities tomorrow.                            Written discharge instructions were provided to the                            patient.                           - Resume previous diet.                           - Continue present medications.                           - Perform a computed tomographic (CT scan)                            enterography at appointment to be scheduled.                           - Internal hemorrhoids were noted on this study and                            may be amenable to hemorrhoid band ligation. If you                            are interested in further treatment of these                            hemorrhoids with band ligation, please contact my                            clinic to set up an appointment for evaluation and                            treatment. Gerrit Heck, MD 04/21/2021 10:08:01 AM

## 2021-04-21 NOTE — Progress Notes (Signed)
Pt's states no medical or surgical changes since previsit or office visit. 

## 2021-04-21 NOTE — Patient Instructions (Signed)
YOU HAD AN ENDOSCOPIC PROCEDURE TODAY AT THE [facility]:   Refer to the procedure report that was given to you for any specific questions about what was found during the examination.  If the procedure report does not answer your questions, please call your gastroenterologist to clarify.  If you requested that your care partner not be given the details of your procedure findings, then the procedure report has been included in a sealed envelope for you to review at your convenience later.  YOU SHOULD EXPECT: Some feelings of bloating in the abdomen. Passage of more gas than usual.  Walking can help get rid of the air that was put into your GI tract during the procedure and reduce the bloating. If you had a lower endoscopy (such as a colonoscopy or flexible sigmoidoscopy) you may notice spotting of blood in your stool or on the toilet paper. If you underwent a bowel prep for your procedure, you may not have a normal bowel movement for a few days.  Please Note:  You might notice some irritation and congestion in your nose or some drainage.  This is from the oxygen used during your procedure.  There is no need for concern and it should clear up in a day or so.  SYMPTOMS TO REPORT IMMEDIATELY:   Following lower endoscopy (colonoscopy or flexible sigmoidoscopy):  Excessive amounts of blood in the stool  Significant tenderness or worsening of abdominal pains  Swelling of the abdomen that is new, acute  Fever of 100F or higher   Following upper endoscopy (EGD)  Vomiting of blood or coffee ground material  New chest pain or pain under the shoulder blades  Painful or persistently difficult swallowing  New shortness of breath  Fever of 100F or higher  Black, tarry-looking stools  For urgent or emergent issues, a gastroenterologist can be reached at any hour by calling [phone]. Do not use MyChart messaging for urgent concerns.    DIET:  We do recommend a small meal at first, but  then you may proceed to your regular diet.  Drink plenty of fluids but you should avoid alcoholic beverages for 24 hours.  ACTIVITY:  You should plan to take it easy for the rest of today and you should NOT DRIVE or use heavy machinery until tomorrow (because of the sedation medicines used during the test).    FOLLOW UP: Our staff will call the number listed on your records 48-72 hours following your procedure to check on you and address any questions or concerns that you may have regarding the information given to you following your procedure. If we do not reach you, we will leave a message.  We will attempt to reach you two times.  During this call, we will ask if you have developed any symptoms of COVID 19. If you develop any symptoms (ie: fever, flu-like symptoms, shortness of breath, cough etc.) before then, please call [phone].  If you test positive for Covid 19 in the 2 weeks post procedure, please call and report this information to us.    If any biopsies were taken you will be contacted by phone or by letter within the next 1-3 weeks.  Please call us at [phone] if you have not heard about the biopsies in 3 weeks.    SIGNATURES/CONFIDENTIALITY: You and/or your care partner have signed paperwork which will be entered into your electronic medical record.  These signatures attest to the fact that that the information above on   your After Visit Summary has been reviewed and is understood.  Full responsibility of the confidentiality of this discharge information lies with you and/or your care-partner. 

## 2021-04-21 NOTE — Progress Notes (Signed)
PT taken to PACU. Monitors in place. VSS. Report given to RN. 

## 2021-04-22 ENCOUNTER — Telehealth: Payer: Self-pay

## 2021-04-22 DIAGNOSIS — R935 Abnormal findings on diagnostic imaging of other abdominal regions, including retroperitoneum: Secondary | ICD-10-CM

## 2021-04-22 DIAGNOSIS — R1031 Right lower quadrant pain: Secondary | ICD-10-CM

## 2021-04-22 NOTE — Telephone Encounter (Signed)
Per 04/21/21 procedure report - Perform a CT enterography at appt to be scheduled. Confirming order with Dr. Bryan Lemma due to IV contrast shortage.

## 2021-04-22 NOTE — Telephone Encounter (Signed)
Cirigliano, Dominic Pea, DO  Anah Billard, [place], RN Understood and good point. Lets change to MR Enterography due to contrast shortage. Thanks!   Spoke with patient and she is aware of the order change. Advised that radiology schedulers will contact her directly to schedule her MR enterography appointment. Advised patient to let us know if she has not heard from them within 2 weeks. Patient verbalized understanding of information and had no concerns at the end of the call.  Message sent to radiology schedulers (April Pait and Rhys Martini).

## 2021-04-23 ENCOUNTER — Telehealth: Payer: Self-pay | Admitting: *Deleted

## 2021-04-23 NOTE — Telephone Encounter (Signed)
1. Have you developed a fever since your procedure? no  2.   Have you had an respiratory symptoms (SOB or cough) since your procedure? no  3.   Have you tested positive for COVID 19 since your procedure no  4.   Have you had any family members/close contacts diagnosed with the COVID 19 since your procedure?  no   If yes to any of these questions please route to Joylene John, RN and Joella Prince, RN Follow up Call-  Call back number 04/21/2021  Post procedure Call Back phone  # [phone]  Permission to leave phone message Yes  Some recent data might be hidden     Patient questions:  Do you have a fever, pain , or abdominal swelling? No. Pain Score  0 *  Have you tolerated food without any problems? Yes.    Have you been able to return to your normal activities? Yes.    Do you have any questions about your discharge instructions: Diet   No. Medications  No. Follow up visit  No.  Do you have questions or concerns about your Care? No.  Actions: * If pain score is 4 or above: No action needed, pain <4.

## 2021-05-04 ENCOUNTER — Other Ambulatory Visit: Payer: Self-pay | Admitting: Gastroenterology

## 2021-05-04 DIAGNOSIS — R935 Abnormal findings on diagnostic imaging of other abdominal regions, including retroperitoneum: Secondary | ICD-10-CM

## 2021-05-04 DIAGNOSIS — R1031 Right lower quadrant pain: Secondary | ICD-10-CM

## 2021-06-04 NOTE — Telephone Encounter (Addendum)
PCP office called on behalf of pt to inform that radiology has not contacted pt yet to schedule exam. PCP wants to know if we could f/u with radiology.

## 2021-06-04 NOTE — Telephone Encounter (Signed)
Radiology scheduling contacted, requested MR entero be scheduled asap.

## 2021-06-17 ENCOUNTER — Other Ambulatory Visit: Payer: Self-pay

## 2021-06-17 ENCOUNTER — Ambulatory Visit (HOSPITAL_COMMUNITY)
Admission: RE | Admit: 2021-06-17 | Discharge: 2021-06-17 | Disposition: A | Discharge status: Home / Self Care | Payer: BC Managed Care – PPO | Source: Ambulatory Visit | Attending: Gastroenterology | Admitting: Gastroenterology

## 2021-06-17 DIAGNOSIS — R935 Abnormal findings on diagnostic imaging of other abdominal regions, including retroperitoneum: Secondary | ICD-10-CM | POA: Diagnosis present

## 2021-06-17 DIAGNOSIS — R1031 Right lower quadrant pain: Secondary | ICD-10-CM

## 2021-06-17 MED ORDER — BARIUM SULFATE 0.1 % PO SUSP
1020.0000 mL | Freq: Once | ORAL | Status: AC
  Administered 2021-06-17: 1020 mL via ORAL

## 2021-06-17 MED ORDER — GADOBUTROL 1 MMOL/ML IV SOLN
7.0000 mL | Freq: Once | INTRAVENOUS | Status: AC | PRN
  Administered 2021-06-17: 7 mL via INTRAVENOUS

## 2022-01-30 ENCOUNTER — Emergency Department (HOSPITAL_COMMUNITY): Payer: BC Managed Care – PPO

## 2022-01-30 ENCOUNTER — Inpatient Hospital Stay (HOSPITAL_COMMUNITY): Payer: BC Managed Care – PPO

## 2022-01-30 ENCOUNTER — Inpatient Hospital Stay (HOSPITAL_COMMUNITY)
Admission: EM | Admit: 2022-01-30 | Discharge: 2022-02-03 | DRG: 390 | Disposition: A | Discharge status: Home / Self Care | Payer: BC Managed Care – PPO | Attending: Surgery | Admitting: Surgery

## 2022-01-30 ENCOUNTER — Encounter (HOSPITAL_COMMUNITY): Payer: Self-pay | Admitting: Emergency Medicine

## 2022-01-30 ENCOUNTER — Other Ambulatory Visit: Payer: Self-pay

## 2022-01-30 DIAGNOSIS — Z79899 Other long term (current) drug therapy: Secondary | ICD-10-CM

## 2022-01-30 DIAGNOSIS — Z20822 Contact with and (suspected) exposure to covid-19: Secondary | ICD-10-CM | POA: Diagnosis present

## 2022-01-30 DIAGNOSIS — Z833 Family history of diabetes mellitus: Secondary | ICD-10-CM | POA: Diagnosis not present

## 2022-01-30 DIAGNOSIS — Z0189 Encounter for other specified special examinations: Secondary | ICD-10-CM

## 2022-01-30 DIAGNOSIS — Z91013 Allergy to seafood: Secondary | ICD-10-CM

## 2022-01-30 DIAGNOSIS — Z8249 Family history of ischemic heart disease and other diseases of the circulatory system: Secondary | ICD-10-CM | POA: Diagnosis not present

## 2022-01-30 DIAGNOSIS — K56609 Unspecified intestinal obstruction, unspecified as to partial versus complete obstruction: Secondary | ICD-10-CM

## 2022-01-30 DIAGNOSIS — Z800 Family history of malignant neoplasm of digestive organs: Secondary | ICD-10-CM | POA: Diagnosis not present

## 2022-01-30 DIAGNOSIS — K565 Intestinal adhesions [bands], unspecified as to partial versus complete obstruction: Principal | ICD-10-CM | POA: Diagnosis present

## 2022-01-30 DIAGNOSIS — E785 Hyperlipidemia, unspecified: Secondary | ICD-10-CM | POA: Diagnosis present

## 2022-01-30 DIAGNOSIS — R109 Unspecified abdominal pain: Secondary | ICD-10-CM

## 2022-01-30 DIAGNOSIS — K219 Gastro-esophageal reflux disease without esophagitis: Secondary | ICD-10-CM | POA: Diagnosis present

## 2022-01-30 DIAGNOSIS — R1031 Right lower quadrant pain: Secondary | ICD-10-CM | POA: Diagnosis present

## 2022-01-30 DIAGNOSIS — Z885 Allergy status to narcotic agent status: Secondary | ICD-10-CM | POA: Diagnosis not present

## 2022-01-30 LAB — COMPREHENSIVE METABOLIC PANEL
ALT: 22 U/L (ref 0–44)
AST: 27 U/L (ref 15–41)
Albumin: 4 g/dL (ref 3.5–5.0)
Alkaline Phosphatase: 48 U/L (ref 38–126)
Anion gap: 6 (ref 5–15)
BUN: 13 mg/dL (ref 8–23)
CO2: 26 mmol/L (ref 22–32)
Calcium: 9.9 mg/dL (ref 8.9–10.3)
Chloride: 106 mmol/L (ref 98–111)
Creatinine, Ser: 0.75 mg/dL (ref 0.44–1.00)
GFR, Estimated: >60 mL/min (ref >60)
Glucose, Bld: 121 mg/dL — ABNORMAL HIGH (ref 70–99)
Potassium: 4 mmol/L (ref 3.5–5.1)
Sodium: 138 mmol/L (ref 135–145)
Total Bilirubin: 1.2 mg/dL (ref 0.3–1.2)
Total Protein: 7.5 g/dL (ref 6.5–8.1)

## 2022-01-30 LAB — CBC
HCT: 42 % (ref 36.0–46.0)
Hemoglobin: 13.7 g/dL (ref 12.0–15.0)
MCH: 25.8 pg — ABNORMAL LOW (ref 26.0–34.0)
MCHC: 32.6 g/dL (ref 30.0–36.0)
MCV: 79.2 fL — ABNORMAL LOW (ref 80.0–100.0)
Platelets: 284 10*3/uL (ref 150–400)
RBC: 5.3 MIL/uL — ABNORMAL HIGH (ref 3.87–5.11)
RDW: 15.2 % (ref 11.5–15.5)
WBC: 5.8 10*3/uL (ref 4.0–10.5)
nRBC: 0 % (ref 0.0–0.2)

## 2022-01-30 LAB — CBC WITH DIFFERENTIAL/PLATELET
Abs Immature Granulocytes: 0.01 10*3/uL (ref 0.00–0.07)
Basophils Absolute: 0 10*3/uL (ref 0.0–0.1)
Basophils Relative: 1 %
Eosinophils Absolute: 0.1 10*3/uL (ref 0.0–0.5)
Eosinophils Relative: 1 %
HCT: 41.9 % (ref 36.0–46.0)
Hemoglobin: 13.7 g/dL (ref 12.0–15.0)
Immature Granulocytes: 0 %
Lymphocytes Relative: 25 %
Lymphs Abs: 1.2 10*3/uL (ref 0.7–4.0)
MCH: 26 pg (ref 26.0–34.0)
MCHC: 32.7 g/dL (ref 30.0–36.0)
MCV: 79.7 fL — ABNORMAL LOW (ref 80.0–100.0)
Monocytes Absolute: 0.5 10*3/uL (ref 0.1–1.0)
Monocytes Relative: 10 %
Neutro Abs: 3.1 10*3/uL (ref 1.7–7.7)
Neutrophils Relative %: 63 %
Platelets: 278 10*3/uL (ref 150–400)
RBC: 5.26 MIL/uL — ABNORMAL HIGH (ref 3.87–5.11)
RDW: 15 % (ref 11.5–15.5)
WBC: 4.9 10*3/uL (ref 4.0–10.5)
nRBC: 0 % (ref 0.0–0.2)

## 2022-01-30 LAB — URINALYSIS, ROUTINE W REFLEX MICROSCOPIC
Bilirubin Urine: NEGATIVE
Glucose, UA: NEGATIVE mg/dL
Hgb urine dipstick: NEGATIVE
Ketones, ur: 5 mg/dL — AB
Leukocytes,Ua: NEGATIVE
Nitrite: NEGATIVE
Protein, ur: NEGATIVE mg/dL
Specific Gravity, Urine: 1.02 (ref 1.005–1.030)
pH: 6 (ref 5.0–8.0)

## 2022-01-30 LAB — RESP PANEL BY RT-PCR (FLU A&B, COVID) ARPGX2
Influenza A by PCR: NEGATIVE
Influenza B by PCR: NEGATIVE
SARS Coronavirus 2 by RT PCR: NEGATIVE

## 2022-01-30 LAB — C-REACTIVE PROTEIN: CRP: 0.9 mg/dL (ref <1.0)

## 2022-01-30 LAB — CREATININE, SERUM
Creatinine, Ser: 0.69 mg/dL (ref 0.44–1.00)
GFR, Estimated: >60 mL/min (ref >60)

## 2022-01-30 LAB — LIPASE, BLOOD: Lipase: 39 U/L (ref 11–51)

## 2022-01-30 LAB — LACTATE DEHYDROGENASE: LDH: 169 U/L (ref 98–192)

## 2022-01-30 LAB — SEDIMENTATION RATE: Sed Rate: 17 mm/hr (ref 0–22)

## 2022-01-30 MED ORDER — CHLORHEXIDINE GLUCONATE 0.12 % MT SOLN
15.0000 mL | Freq: Two times a day (BID) | OROMUCOSAL | Status: DC
  Administered 2022-01-30 – 2022-02-02 (×4): 15 mL via OROMUCOSAL
  Filled 2022-01-30 (×5): qty 15

## 2022-01-30 MED ORDER — ENOXAPARIN SODIUM 40 MG/0.4ML IJ SOSY
40.0000 mg | PREFILLED_SYRINGE | INTRAMUSCULAR | Status: DC
  Administered 2022-01-30 – 2022-02-02 (×4): 40 mg via SUBCUTANEOUS
  Filled 2022-01-30 (×4): qty 0.4

## 2022-01-30 MED ORDER — ORAL CARE MOUTH RINSE
15.0000 mL | Freq: Two times a day (BID) | OROMUCOSAL | Status: DC
  Administered 2022-02-02: 15 mL via OROMUCOSAL

## 2022-01-30 MED ORDER — ACETAMINOPHEN 325 MG PO TABS: 650.0000 mg | ORAL_TABLET | Freq: Four times a day (QID) | ORAL | Status: DC | PRN

## 2022-01-30 MED ORDER — MORPHINE SULFATE (PF) 4 MG/ML IV SOLN
4.0000 mg | Freq: Once | INTRAVENOUS | Status: AC
  Administered 2022-01-30: 4 mg via INTRAVENOUS
  Filled 2022-01-30: qty 1

## 2022-01-30 MED ORDER — IOHEXOL 300 MG/ML  SOLN
100.0000 mL | Freq: Once | INTRAMUSCULAR | Status: AC | PRN
  Administered 2022-01-30: 100 mL via INTRAVENOUS

## 2022-01-30 MED ORDER — HYDROMORPHONE HCL 1 MG/ML IJ SOLN
0.5000 mg | Freq: Once | INTRAMUSCULAR | Status: AC
  Administered 2022-01-30: 0.5 mg via INTRAVENOUS
  Filled 2022-01-30: qty 1

## 2022-01-30 MED ORDER — ONDANSETRON HCL 4 MG/2ML IJ SOLN
4.0000 mg | Freq: Once | INTRAMUSCULAR | Status: AC
  Administered 2022-01-30: 4 mg via INTRAVENOUS
  Filled 2022-01-30: qty 2

## 2022-01-30 MED ORDER — DICYCLOMINE HCL 10 MG PO CAPS
10.0000 mg | ORAL_CAPSULE | Freq: Once | ORAL | Status: AC
  Administered 2022-01-30: 10 mg via ORAL
  Filled 2022-01-30: qty 1

## 2022-01-30 MED ORDER — ONDANSETRON HCL 4 MG/2ML IJ SOLN
4.0000 mg | Freq: Four times a day (QID) | INTRAMUSCULAR | Status: DC | PRN
  Administered 2022-01-31: 4 mg via INTRAVENOUS
  Filled 2022-01-30: qty 2

## 2022-01-30 MED ORDER — HYDROMORPHONE HCL 1 MG/ML IJ SOLN
1.0000 mg | INTRAMUSCULAR | Status: DC | PRN
  Administered 2022-01-30 – 2022-01-31 (×3): 1 mg via INTRAVENOUS
  Filled 2022-01-30 (×3): qty 1

## 2022-01-30 MED ORDER — KCL IN DEXTROSE-NACL 20-5-0.45 MEQ/L-%-% IV SOLN
INTRAVENOUS | Status: DC
  Filled 2022-01-30 (×3): qty 1000

## 2022-01-30 MED ORDER — DIATRIZOATE MEGLUMINE & SODIUM 66-10 % PO SOLN
90.0000 mL | Freq: Once | ORAL | Status: AC
  Administered 2022-01-30: 90 mL via NASOGASTRIC
  Filled 2022-01-30: qty 90

## 2022-01-30 MED ORDER — ONDANSETRON 4 MG PO TBDP: 4.0000 mg | ORAL_TABLET | Freq: Four times a day (QID) | ORAL | Status: DC | PRN

## 2022-01-30 MED ORDER — ACETAMINOPHEN 650 MG RE SUPP: 650.0000 mg | Freq: Four times a day (QID) | RECTAL | Status: DC | PRN

## 2022-01-30 NOTE — H&P (Signed)
Nicole Gilbert is an 61 y.o. female.   Chief Complaint: abdominal pain, nausea, emesis - small bowel obstruction  HPI: Patient is a 61 year old female admitted from the emergency department with 24-hour history of abdominal pain, nausea, and emesis.  Patient had a similar episode in April 2022 for which she was admitted.  Patient had generalized abdominal discomfort.  She developed nausea.  She had a large emesis here in the emergency department.  Laboratory studies show a normal white blood count, normal electrolytes, and normal liver enzymes.  Patient underwent CT scan of the abdomen and pelvis which shows a distal small bowel obstruction likely due to an adhesion in the pelvis with some thickening of the distal ileum.  Patient has a history of colonoscopy approximately 1 year ago which was normal.  Past surgical history is notable for cesarean section.  She has had no other abdominal surgery.  Patient works as a Conservation officer, historic buildings at [employer]  Past Medical History:  Diagnosis Date   Anxiety    Chronic constipation    GERD (gastroesophageal reflux disease)    Hemorrhoids 2006   Hyperlipemia    Insomnia     Past Surgical History:  Procedure Laterality Date   ABDOMINAL HYSTERECTOMY     BIOPSY  06/25/2014   Dr. Geanie Logan features are not identified   Carpal tunnel right hand     CESAREAN SECTION     x2   COLONOSCOPY   05/19/2008   Rourk-Prominent internal hemorrhoids and anal papilla, otherwise normal appearing rectal mucosa / Colonic mucosa appeared normal except for melanosis coli   COLONOSCOPY  08/22/2012   Rourk- hyperplastic polyps and tubular adenoma, query infectious colitis vs NSAID effect. Next TCS 08/2017.   ESOPHAGOGASTRODUODENOSCOPY   01/20/2005   Rourk- Normal-appearing esophagus, status post passage of a 36 French Maloney  dilator/ small HH/  Otherwise normal stomach   ESOPHAGOGASTRODUODENOSCOPY N/A 02/06/2013   DUK:GURKYH esophagus. Small hiatal hernia. Antral  erosions: bx no h.pylori.   ESOPHAGOGASTRODUODENOSCOPY N/A 06/25/2014   Dr. Myra Gianotti nodule-status post biopsy; otherwise, normalEGD. No endoscopic explanation for patient's symptoms   HEMORRHOID SURGERY  06/30/2008   Dr Arnoldo Morale   KNEE ARTHROSCOPY     2010, RIGHT KNEE     Family History  Problem Relation Age of Onset   Diabetes Father    Colon cancer Brother 6       (paternal half brother)   Coronary artery disease Maternal Grandmother    Lung cancer Other    Social History:  reports that she has never smoked. She has never used smokeless tobacco. She reports current alcohol use. She reports that she does not use drugs.  Allergies:  Allergies  Allergen Reactions   Morphine And Related Nausea And Vomiting   Shellfish Allergy Hives and Itching    (Not in a hospital admission)   Results for orders placed or performed during the hospital encounter of 01/30/22 (from the past 48 hour(s))  Urinalysis, Routine w reflex microscopic Urine, Clean Catch     Status: Abnormal   Collection Time: 01/30/22 12:56 PM  Result Value Ref Range   Color, Urine YELLOW YELLOW   APPearance CLEAR CLEAR   Specific Gravity, Urine 1.020 1.005 - 1.030   pH 6.0 5.0 - 8.0   Glucose, UA NEGATIVE NEGATIVE mg/dL   Hgb urine dipstick NEGATIVE NEGATIVE   Bilirubin Urine NEGATIVE NEGATIVE   Ketones, ur 5 (A) NEGATIVE mg/dL   Protein, ur NEGATIVE NEGATIVE mg/dL  Nitrite NEGATIVE NEGATIVE   Leukocytes,Ua NEGATIVE NEGATIVE    Comment: Performed at [facility] [address] [place], [place] [address]  CBC with Differential     Status: Abnormal   Collection Time: 01/30/22 12:57 PM  Result Value Ref Range   WBC 4.9 4.0 - 10.5 K/uL   RBC 5.26 (H) 3.87 - 5.11 MIL/uL   Hemoglobin 13.7 12.0 - 15.0 g/dL   HCT 41.9 36.0 - 46.0 %   MCV 79.7 (L) 80.0 - 100.0 fL   MCH 26.0 26.0 - 34.0 pg   MCHC 32.7 30.0 - 36.0 g/dL   RDW 15.0 11.5 - 15.5 %   Platelets 278 150 - 400 K/uL   nRBC 0.0  0.0 - 0.2 %   Neutrophils Relative % 63 %   Neutro Abs 3.1 1.7 - 7.7 K/uL   Lymphocytes Relative 25 %   Lymphs Abs 1.2 0.7 - 4.0 K/uL   Monocytes Relative 10 %   Monocytes Absolute 0.5 0.1 - 1.0 K/uL   Eosinophils Relative 1 %   Eosinophils Absolute 0.1 0.0 - 0.5 K/uL   Basophils Relative 1 %   Basophils Absolute 0.0 0.0 - 0.1 K/uL   Immature Granulocytes 0 %   Abs Immature Granulocytes 0.01 0.00 - 0.07 K/uL    Comment: Performed at [facility], [place] [address], [place], [place] [address]  Comprehensive metabolic panel     Status: Abnormal   Collection Time: 01/30/22 12:57 PM  Result Value Ref Range   Sodium 138 135 - 145 mmol/L   Potassium 4.0 3.5 - 5.1 mmol/L   Chloride 106 98 - 111 mmol/L   CO2 26 22 - 32 mmol/L   Glucose, Bld 121 (H) 70 - 99 mg/dL    Comment: Glucose reference range applies only to samples taken after fasting for at least 8 hours.   BUN 13 8 - 23 mg/dL   Creatinine, Ser 0.75 0.44 - 1.00 mg/dL   Calcium 9.9 8.9 - 10.3 mg/dL   Total Protein 7.5 6.5 - 8.1 g/dL   Albumin 4.0 3.5 - 5.0 g/dL   AST 27 15 - 41 U/L   ALT 22 0 - 44 U/L   Alkaline Phosphatase 48 38 - 126 U/L   Total Bilirubin 1.2 0.3 - 1.2 mg/dL   GFR, Estimated >60 >60 mL/min    Comment: (NOTE) Calculated using the CKD-EPI Creatinine Equation (2021)    Anion gap 6 5 - 15    Comment: Performed at [facility], [place] [address] [place], [place] [address]  Lipase, blood     Status: None   Collection Time: 01/30/22 12:57 PM  Result Value Ref Range   Lipase 39 11 - 51 U/L    Comment: Performed at [facility], [place] [address], [place], [place] [address]   No results found.  Review of Systems  Constitutional:  Positive for appetite change.  HENT: Negative.    Eyes: Negative.   Respiratory: Negative.    Cardiovascular: Negative.   Gastrointestinal:  Positive for abdominal distention, abdominal pain, nausea and vomiting.  Endocrine:  Negative.   Genitourinary: Negative.   Musculoskeletal: Negative.   Skin:        Hair loss  Allergic/Immunologic: Negative.   Neurological: Negative.   Hematological: Negative.   Psychiatric/Behavioral: Negative.      Physical Exam   Blood pressure (!) 136/92, pulse 65, temperature 98.2 F (36.8 C), temperature source Oral, resp. rate 18,  height 5\' 1"  (1.549 m), weight 72.6 kg, SpO2 97 %.  CONSTITUTIONAL: Mild discomfort, hold emesis bag; conversant; no obvious deformities  EYES: Conjunctiva clear and moist; pupils equal bilaterally  NECK: trachea midline; no thyroid nodularity  LUNGS: respiratory effort normal & unlabored; no wheeze; no rales  CV: rate and rhythm regular; no significant murmur; no edema bilat lower extremities  GI: abdomen is soft, mild distention, mild diffuse tenderness to palpation.  No palpable masses.  No significant guarding.  Quiet to auscultation.  MSK: normal range of motion of extremities; no clubbing; no cyanosis  PSYCH: appropriate affect for situation; alert and oriented to person, place, & time  LYMPHATIC: no palpable cervical lymphadenopathy    Assessment/Plan  Distal small bowel obstruction Possible enteritis  Admit to general surgery service  NG tube, begin small bowel protocol - discussed with patient  IV hydration, NPO  Pain, nausea Rx  I discussed admission with the patient.  She has had a nasogastric tube with her previous episode last year.  We will place a nasogastric tube and begin the small bowel protocol.  We discussed the possible clinical course including the possibility of surgery.  The patient understands and agrees to proceed.  Armandina Gemma, MD [facility] [place] [facility] Office: [phone]   Armandina Gemma, MD 01/30/2022, 5:07 PM

## 2022-01-30 NOTE — ED Provider Notes (Signed)
[facility] DEPT Provider Note   CSN: [id number] Arrival date & time: 01/30/22  1212     History  Chief Complaint  Patient presents with   Abdominal Pain    Nicole Gilbert is a 61 y.o. female.  Past medical history includes hyperlipidemia, constipation, GERD, anxiety.  Patient presents the emergency department with acute onset of right lower quadrant abdominal pain.  States she was in her normal state of health when she went to bed last night.  She woke up around 5 AM with severe suprapubic and right lower quadrant pain.  She says that she feels like the pain is radiating into her back.  There is some pain that comes in waves but is more severe than others.  She states that the pain has been constant since then.  She tried to see if it would go away on its own, however the more she moves around, the more it started hurting more worse.  She has associated nausea and vomiting.  She has had 2 bowel movements that were normal formed today.  There was no blood in this.  Her vomit was nonbloody and nonbilious.  She has not had any dysuria or hematuria.  There is no flank pain.  She denies any vaginal bleeding or discharge.  She is sexually active but has not been recently and is with the same partner.  She denies any fevers or chills.  Denies diarrhea. He states that she has felt similar pain when there is time back in March 2022.  She was admitted at this time and they never really found out what was going on with her.  Pain ended up getting better with supportive treatment in the hospital. She has a hx of hysterectomy, unsure on whether she has her ovaries or not. She has not had any other abdominal surgery.   Abdominal Pain Associated symptoms: nausea and vomiting   Associated symptoms: no constipation, no diarrhea, no dysuria, no fever, no hematuria, no vaginal bleeding and no vaginal discharge       Home Medications Prior to Admission medications   Medication  Sig Start Date End Date Taking? Authorizing Provider  cholecalciferol (VITAMIN D3) 25 MCG (1000 UNIT) tablet Take 2,000 Units by mouth daily.    [provider]  ELDERBERRY PO Take 2 tablets by mouth daily.    [provider]  fluticasone (FLONASE) 50 MCG/ACT nasal spray Place 1 spray into both nostrils daily. 03/02/20   [provider]  hydrochlorothiazide (MICROZIDE) 12.5 MG capsule Take 12.5 mg by mouth daily. 12/31/21   [provider]  multivitamin-iron-minerals-folic acid (CENTRUM) chewable tablet Chew 1 tablet by mouth daily.    [provider]  Probiotic Product (PROBIOTIC PO) Take 1 capsule by mouth daily.    [provider]  psyllium (METAMUCIL) 58.6 % powder Take 1 packet by mouth daily.    [provider]  vitamin B-12 (CYANOCOBALAMIN) 100 MCG tablet Take 200 mcg by mouth daily.    [provider]  vitamin C (ASCORBIC ACID) 500 MG tablet Take 1,000 mg by mouth daily.    [provider]      Allergies    Morphine and related and Shellfish allergy    Review of Systems   Review of Systems  Constitutional:  Negative for fever.  Gastrointestinal:  Positive for abdominal pain, nausea and vomiting. Negative for abdominal distention, blood in stool, constipation and diarrhea.  Genitourinary:  Negative for dysuria, flank pain, hematuria,  vaginal bleeding and vaginal discharge.  All other systems reviewed and are negative.  Physical Exam Updated Vital Signs BP (!) 136/92    Pulse 65    Temp 98.2 F (36.8 C) (Oral)    Resp 18    Ht 5' 1"  (1.549 m)    Wt 72.6 kg    SpO2 97%    BMI 30.23 kg/m  Physical Exam Vitals and nursing note reviewed.  Constitutional:      General: She is in acute distress.     Appearance: Normal appearance. She is not ill-appearing, toxic-appearing or diaphoretic.     Comments: Patient appears very uncomfortable in bed due to severe pain.  HENT:     Head: Normocephalic and  atraumatic.     Nose: No nasal deformity.     Mouth/Throat:     Lips: Pink. No lesions.     Mouth: Mucous membranes are moist. No injury, lacerations, oral lesions or angioedema.     Pharynx: Oropharynx is clear. Uvula midline. No pharyngeal swelling, oropharyngeal exudate, posterior oropharyngeal erythema or uvula swelling.  Eyes:     General: Gaze aligned appropriately. No scleral icterus.       Right eye: No discharge.        Left eye: No discharge.     Conjunctiva/sclera: Conjunctivae normal.     Right eye: Right conjunctiva is not injected. No exudate or hemorrhage.    Left eye: Left conjunctiva is not injected. No exudate or hemorrhage. Cardiovascular:     Rate and Rhythm: Normal rate and regular rhythm.     Pulses: Normal pulses.          Radial pulses are 2+ on the right side and 2+ on the left side.       Dorsalis pedis pulses are 2+ on the right side and 2+ on the left side.     Heart sounds: Normal heart sounds, S1 normal and S2 normal. Heart sounds not distant. No murmur heard.   No friction rub. No gallop. No S3 or S4 sounds.  Pulmonary:     Effort: Pulmonary effort is normal. No accessory muscle usage or respiratory distress.     Breath sounds: Normal breath sounds. No stridor. No wheezing, rhonchi or rales.  Chest:     Chest wall: No tenderness.  Abdominal:     General: Abdomen is flat. Bowel sounds are normal. There is no distension.     Palpations: Abdomen is soft. There is no mass or pulsatile mass.     Tenderness: There is abdominal tenderness in the right lower quadrant and suprapubic area. There is guarding. There is no right CVA tenderness, left CVA tenderness or rebound. Positive signs include McBurney's sign. Negative signs include Murphy's sign and Rovsing's sign.     Hernia: No hernia is present.  Musculoskeletal:     Right lower leg: No edema.     Left lower leg: No edema.  Skin:    General: Skin is warm and dry.     Coloration: Skin is not jaundiced or  pale.     Findings: No bruising, erythema, lesion or rash.  Neurological:     General: No focal deficit present.     Mental Status: She is alert and oriented to person, place, and time.     GCS: GCS eye subscore is 4. GCS verbal subscore is 5. GCS motor subscore is 6.  Psychiatric:        Mood and Affect: Mood normal.  Behavior: Behavior normal. Behavior is cooperative.    ED Results / Procedures / Treatments   Labs (all labs ordered are listed, but only abnormal results are displayed) Labs Reviewed  CBC WITH DIFFERENTIAL/PLATELET - Abnormal; Notable for the following components:      Result Value   RBC 5.26 (*)    MCV 79.7 (*)    All other components within normal limits  COMPREHENSIVE METABOLIC PANEL - Abnormal; Notable for the following components:   Glucose, Bld 121 (*)    All other components within normal limits  URINALYSIS, ROUTINE W REFLEX MICROSCOPIC - Abnormal; Notable for the following components:   Ketones, ur 5 (*)    All other components within normal limits  RESP PANEL BY RT-PCR (FLU A&B, COVID) ARPGX2  LIPASE, BLOOD  C-REACTIVE PROTEIN  SEDIMENTATION RATE  LACTATE DEHYDROGENASE  CBC  CREATININE, SERUM    EKG None  Radiology No results found.  Procedures Procedures   Medications Ordered in ED Medications  enoxaparin (LOVENOX) injection 40 mg (has no administration in time range)  dextrose 5 % and 0.45 % NaCl with KCl 20 mEq/L infusion (has no administration in time range)  acetaminophen (TYLENOL) tablet 650 mg (has no administration in time range)    Or  acetaminophen (TYLENOL) suppository 650 mg (has no administration in time range)  HYDROmorphone (DILAUDID) injection 1 mg (has no administration in time range)  ondansetron (ZOFRAN-ODT) disintegrating tablet 4 mg (has no administration in time range)    Or  ondansetron (ZOFRAN) injection 4 mg (has no administration in time range)  diatrizoate meglumine-sodium (GASTROGRAFIN) 66-10 % solution  90 mL (has no administration in time range)  morphine (PF) 4 MG/ML injection 4 mg (4 mg Intravenous Given 01/30/22 1343)  ondansetron (ZOFRAN) injection 4 mg (4 mg Intravenous Given 01/30/22 1317)  dicyclomine (BENTYL) capsule 10 mg (10 mg Oral Given 01/30/22 1325)  iohexol (OMNIPAQUE) 300 MG/ML solution 100 mL (100 mLs Intravenous Contrast Given 01/30/22 1351)  HYDROmorphone (DILAUDID) injection 0.5 mg (0.5 mg Intravenous Given 01/30/22 1604)    ED Course/ Medical Decision Making/ A&P Clinical Course as of 01/30/22 1731  Sun Jan 30, 2022  1548 CT reveals low grade distal small bowel obstruction with transition point in the central pelvis. There is also mild wall thinkening involving terminal ileium and several distal small bowel loops with mild mesenteric stranding and minimal free fluid. Ddx includes Crohn disease, infectious, or inflammatory etiologies of enteritis. No abscess or free intraperitoneal air noted.  [GL]  1610 Spoke with [facility], Dr. Lyndel Safe:  He doesn't think this is crohns. With negative recent scope 6 months ago this is unlikely. Not likely they could get the scope to reach the ileum either.  Wants to add on CRP, ESR, and LDH. Recommends surgery consult for consideration of laparoscopy. Does not think she needs antibiotics at this time.  [GL]  [id number] Spoke with general surgery, Dr. Harlow Asa. They will see patient. [GL]    Clinical Course User Index [GL] Adolphus Birchwood, PA-C                           Medical Decision Making Problems Addressed: Small bowel obstruction [facility]): complicated acute illness or injury with systemic symptoms  Amount and/or Complexity of Data Reviewed External Data Reviewed: labs, radiology, ECG and notes. Labs: ordered. Decision-making details documented in ED Course. Radiology: ordered and independent interpretation performed. Decision-making details documented in ED Course.  Risk Prescription drug  management. Decision regarding  hospitalization.    MDM  This is a 61 y.o. female with a pertinent PMH of hyperlipidemia, constipation, GERD, anxiety, hx of hysterectomy, who presents to the ED with suprapubic and right lower quadrant abdominal pain.   The differential of this patient includes but is not limited to Appendicitis, hernia, Diverticulitis, Ovarian Torsion, UTI, and Constipation.  My Impression, Plan, and ED Course: This was a acute onset about 7 hours ago.  Patient has stable vitals with no fever.  She appears overall uncomfortable.  Exam has some tenderness in the right lower quadrant and suprapubic region with guarding.  Apparently patient had similar symptoms about 1 year ago where there was concern between enteritis versus appendicitis.  She did up getting admitted at this time and they ultimately never figured out exactly what was wrong.  She followed up with gastroenterology and had colonoscopy and other work-up that was unrevealing.  She never required any surgical intervention.  Plan to obtain abdominal labs.  Will obtain CT abdomen pelvis given severe pain to rule out appendicitis versus diverticulitis versus other intra-abdominal etiology.  Plan to give patient IV Zofran, morphine, Bentyl for her symptoms. Labs reveal no leukocytosis.  CBC overall unremarkable.  CMP is also unremarkable with no AKI or any other rectal abnormality.  Lipase is negative so doubt pancreatitis.  Urinalysis is not suggestive of infection.  CT is suggestive of a low-grade distal small bowel obstruction with transition point in the central pelvis.  There is also some mild wall thickening of the terminal ileum and mild mesenteric stranding with concern for enteritis.  I called GI and spoke with Dr. Lyndel Safe.  He does not think that this is Crohn's disease as patient had a negative colonoscopy 6 months ago.  He does not feel that he get another scope up to the point where the inflammation is.  Recommends surgical consult adding on CRP, LDH, and  Sed Rate. They will consult in the morning if accepting team feels they need further input. I then spoke with Dr. Harlow Asa from general surgery.  He is going to see the patient and recommends admission to the general surgery service at this time.    Charting Requirements Additional History obtained from n/a. External Records from outside source obtained and reviewed including: Hospitalization last April for similar symptoms and f/u colonoscopy over the summer Social Determinants of Health: n/a I personally ordered, reviewed, and interpreted all laboratory work and imaging and agree with radiologist interpretation. Abnormal results are outlined: labs overall unremarkable. CT reveals SBO with possible enteritis. This patient was maintained on a cardiac monitor/telemetry. I personally viewed and interpreted the cardiac monitor which reveals an underlying rhythm of NSR Medications given in ED: Reevaluation of the patient after these medicines showed that the patient  improved initially, but required additional dosing of pain meds. Consultations:  - Dr. Lyndel Safe from [facility]. Added on recommended labs. Recommends surgical consult - Dr. Harlow Asa from General Surgery. They will admit this patient. Disposition: I considered hospitalization for this patient and ultimately feel that patient would benefit from hospitalization, NGT, and pain management  I have discussed this patient with my attending physician, Dr. Vanita Panda who has made changes to the plan accordingly.  Portions of this note were generated with [employer]. Dictation errors may occur despite best attempts at proofreading.    Final Clinical Impression(s) / ED Diagnoses Final diagnoses:  Encounter for imaging study to confirm nasogastric (NG) tube placement  Small bowel obstruction (  [facility])    Rx / DC Orders ED Discharge Orders     None         Sheila Oats 01/30/22 1732    Carmin Muskrat, MD 01/30/22  2004

## 2022-01-30 NOTE — ED Notes (Signed)
Shirlee Limerick, [place] made aware of patient's return of pain.

## 2022-01-30 NOTE — ED Triage Notes (Signed)
Patient c/o lower abdominal pain with nausea since this morning.

## 2022-01-31 ENCOUNTER — Inpatient Hospital Stay (HOSPITAL_COMMUNITY): Payer: BC Managed Care – PPO

## 2022-01-31 MED ORDER — DIPHENHYDRAMINE HCL 50 MG/ML IJ SOLN
12.5000 mg | Freq: Three times a day (TID) | INTRAMUSCULAR | Status: DC | PRN
  Administered 2022-01-31: 12.5 mg via INTRAVENOUS
  Filled 2022-01-31: qty 1

## 2022-01-31 MED ORDER — ACETAMINOPHEN 500 MG PO TABS
1000.0000 mg | ORAL_TABLET | Freq: Four times a day (QID) | ORAL | Status: DC
Start: 2022-01-31 — End: 2022-02-03
  Administered 2022-01-31 – 2022-02-01 (×6): 1000 mg via ORAL
  Filled 2022-01-31 (×7): qty 2

## 2022-01-31 MED ORDER — TRAMADOL HCL 50 MG PO TABS
50.0000 mg | ORAL_TABLET | Freq: Four times a day (QID) | ORAL | Status: DC | PRN
  Administered 2022-02-01 – 2022-02-02 (×2): 50 mg via ORAL
  Filled 2022-01-31 (×2): qty 1

## 2022-01-31 MED ORDER — HYDROMORPHONE HCL 1 MG/ML IJ SOLN: 0.5000 mg | INTRAMUSCULAR | Status: DC | PRN

## 2022-01-31 NOTE — Progress Notes (Signed)
°  Transition of Care [facility]) Screening Note   Patient Details  Name: SAMANTHAN DUGO Date of Birth: 07-23-1961   Transition of Care [facility]) CM/SW Contact:    Lennart Pall, LCSW Phone Number: 01/31/2022, 11:13 AM    Transition of Care Department [facility]) has reviewed patient and no TOC needs have been identified at this time. We will continue to monitor patient advancement through interdisciplinary progression rounds. If new patient transition needs arise, please place a TOC consult.

## 2022-01-31 NOTE — Progress Notes (Signed)
Subjective: Feeling better today.  + flatus.  No BM.  NGT is very uncomfortable  ROS: See above, otherwise other systems negative  Objective: Vital signs in last 24 hours: Temp:  [97.8 F (36.6 C)-99 F (37.2 C)] 98.3 F (36.8 C) (02/27 0948) Pulse Rate:  [65-82] 75 (02/27 0948) Resp:  [14-20] 14 (02/27 0948) BP: (112-156)/(75-99) 147/80 (02/27 0948) SpO2:  [85 %-100 %] 99 % (02/27 0948) Weight:  [72.6 kg] 72.6 kg (02/26 1554) Last BM Date : 01/30/22  Intake/Output from previous day: 02/26 0701 - 02/27 0700 In: 1018.2 [I.V.:1018.2] Out: 300 [Emesis/NG output:300] Intake/Output this shift: Total I/O In: 300 [I.V.:300] Out: -   PE: Gen: NAD Abd: soft, NT, ND, +Bs, NGT with minimal watered down output  Lab Results:  Recent Labs    01/30/22 1257 01/30/22 1719  WBC 4.9 5.8  HGB 13.7 13.7  HCT 41.9 42.0  PLT 278 284   BMET Recent Labs    01/30/22 1257 01/30/22 1719  NA 138  --   K 4.0  --   CL 106  --   CO2 26  --   GLUCOSE 121*  --   BUN 13  --   CREATININE 0.75 0.69  CALCIUM 9.9  --    PT/INR No results for input(s): LABPROT, INR in the last 72 hours. CMP     Component Value Date/Time   NA 138 01/30/2022 1257   K 4.0 01/30/2022 1257   CL 106 01/30/2022 1257   CO2 26 01/30/2022 1257   GLUCOSE 121 (H) 01/30/2022 1257   BUN 13 01/30/2022 1257   CREATININE 0.69 01/30/2022 1719   CALCIUM 9.9 01/30/2022 1257   PROT 7.5 01/30/2022 1257   ALBUMIN 4.0 01/30/2022 1257   AST 27 01/30/2022 1257   ALT 22 01/30/2022 1257   ALKPHOS 48 01/30/2022 1257   BILITOT 1.2 01/30/2022 1257   GFRNONAA >60 01/30/2022 1719   GFRAA >60 09/30/2017 1000   Lipase     Component Value Date/Time   LIPASE 39 01/30/2022 1257       Studies/Results: CT Abdomen Pelvis W Contrast  Result Date: 01/30/2022 CLINICAL DATA:  Right lower quadrant pain. EXAM: CT ABDOMEN AND PELVIS WITH CONTRAST TECHNIQUE: Multidetector CT imaging of the abdomen and pelvis was performed  using the standard protocol following bolus administration of intravenous contrast. RADIATION DOSE REDUCTION: This exam was performed according to the departmental dose-optimization program which includes automated exposure control, adjustment of the mA and/or kV according to patient size and/or use of iterative reconstruction technique. CONTRAST:  111mL OMNIPAQUE IOHEXOL 300 MG/ML  SOLN COMPARISON:  03/04/2021 FINDINGS: Lower Chest: No acute findings. Hepatobiliary: No hepatic masses identified. Gallbladder is unremarkable. No evidence of biliary ductal dilatation. Pancreas:  No mass or inflammatory changes. Spleen: Within normal limits in size and appearance. Adrenals/Urinary Tract: No masses identified. No evidence of ureteral calculi or hydronephrosis. Stomach/Bowel: Mild wall thickening is seen involving the terminal ileum and several distal small bowel loops. Transition point is seen in the central pelvis, with moderate small bowel dilatation and some feces proximal to this point, consistent with small-bowel obstruction. Mild soft tissue stranding is seen within right lower quadrant and pelvic mesenteric fat, with tiny amount of free fluid. This shows mild increase compared to previous study. No evidence of abscess or free air. Vascular/Lymphatic: No pathologically enlarged lymph nodes. No acute vascular findings. Reproductive: Prior hysterectomy noted. Adnexal regions are unremarkable in appearance. Other:  None. Musculoskeletal:  No suspicious bone lesions identified. IMPRESSION: Low-grade distal small bowel obstruction, with transition point in the central pelvis. Mild wall thickening involving terminal ileum and several distal small bowel loops, with mild mesenteric stranding and minimal free fluid, mildly increased since previous study. Differential diagnosis includes Crohn disease, and infectious or other inflammatory etiologies of enteritis. No evidence of abscess or free intraperitoneal air.  Electronically Signed   By: Marlaine Hind M.D.   On: 01/30/2022 14:32   DG Abd Portable 1V-Small Bowel Obstruction Protocol-initial, 8 hr delay  Result Date: 01/31/2022 CLINICAL DATA:  Small bowel obstruction, 8 hour follow-up EXAM: PORTABLE ABDOMEN - 1 VIEW COMPARISON:  Yesterday FINDINGS: Enteric contrast is seen within the proximal colon. The enteric tube is folded in the stomach. Contrast in the bladder from recent enhanced scan. IMPRESSION: 1. Enteric contrast has reached the colon. No gas dilated bowel; distended loops on prior CT were fluid-filled and likely underestimated radiographically. 2. Enteric tube which is folded in the stomach. Electronically Signed   By: Jorje Guild M.D.   On: 01/31/2022 06:32   DG Abd Portable 1V-Small Bowel Protocol-Position Verification  Result Date: 01/30/2022 CLINICAL DATA:  Status post nasogastric tube placement. EXAM: PORTABLE ABDOMEN - 1 VIEW COMPARISON:  11/16/2016 FINDINGS: Nasogastric tube is in place, side port in the region the distal esophagus, tip within the proximal stomach. Consider advancing 2 6-7 centimeters. Lung bases are unremarkable. Visualized bowel gas pattern is nonobstructive. IMPRESSION: Nasogastric tube to the very proximal stomach. Electronically Signed   By: Nolon Nations M.D.   On: 01/30/2022 18:12    Anti-infectives: Anti-infectives (From admission, onward)    None        Assessment/Plan SBO -Dc NGT -CLD -mobilize/pulm toilet   FEN - CLD/IVFs VTE - lovenox ID - none  Straightforward Medical Decision Making  LOS: 1 day    Henreitta Cea , [facility] 01/31/2022, 9:51 AM Please see Amion for pager number during day hours 7:00am-4:30pm or 7:00am -11:30am on weekends

## 2022-02-01 MED ORDER — POLYETHYLENE GLYCOL 3350 17 G PO PACK
17.0000 g | PACK | Freq: Every day | ORAL | Status: DC
Start: 2022-02-01 — End: 2022-02-03
  Administered 2022-02-01 – 2022-02-03 (×3): 17 g via ORAL
  Filled 2022-02-01 (×3): qty 1

## 2022-02-01 MED ORDER — GLYCERIN (LAXATIVE) 2 G RE SUPP
1.0000 | Freq: Once | RECTAL | Status: AC
  Administered 2022-02-01: 1 via RECTAL
  Filled 2022-02-01: qty 1

## 2022-02-01 NOTE — Progress Notes (Signed)
Subjective: Drank some liquids yesterday with no issues.  Has still had flatus but no BM.  Still with some soreness and pain in RLQ, but better than admission.  ROS: See above, otherwise other systems negative  Objective: Vital signs in last 24 hours: Temp:  [97.6 F (36.4 C)-98.5 F (36.9 C)] 98 F (36.7 C) (02/28 0543) Pulse Rate:  [63-75] 69 (02/28 0543) Resp:  [15-18] 16 (02/28 0543) BP: (114-147)/(64-86) 135/86 (02/28 0543) SpO2:  [99 %-100 %] 100 % (02/28 0543) Last BM Date : 01/30/22  Intake/Output from previous day: 02/27 0701 - 02/28 0700 In: 1848.7 [P.O.:470; I.V.:1378.7] Out: 1500 [Urine:1500] Intake/Output this shift: No intake/output data recorded.  PE: Gen: NAD Abd: soft, mildly tender in RLQ, ND, +Bs  Lab Results:  Recent Labs    01/30/22 1257 01/30/22 1719  WBC 4.9 5.8  HGB 13.7 13.7  HCT 41.9 42.0  PLT 278 284   BMET Recent Labs    01/30/22 1257 01/30/22 1719  NA 138  --   K 4.0  --   CL 106  --   CO2 26  --   GLUCOSE 121*  --   BUN 13  --   CREATININE 0.75 0.69  CALCIUM 9.9  --    PT/INR No results for input(s): LABPROT, INR in the last 72 hours. CMP     Component Value Date/Time   NA 138 01/30/2022 1257   K 4.0 01/30/2022 1257   CL 106 01/30/2022 1257   CO2 26 01/30/2022 1257   GLUCOSE 121 (H) 01/30/2022 1257   BUN 13 01/30/2022 1257   CREATININE 0.69 01/30/2022 1719   CALCIUM 9.9 01/30/2022 1257   PROT 7.5 01/30/2022 1257   ALBUMIN 4.0 01/30/2022 1257   AST 27 01/30/2022 1257   ALT 22 01/30/2022 1257   ALKPHOS 48 01/30/2022 1257   BILITOT 1.2 01/30/2022 1257   GFRNONAA >60 01/30/2022 1719   GFRAA >60 09/30/2017 1000   Lipase     Component Value Date/Time   LIPASE 39 01/30/2022 1257       Studies/Results: CT Abdomen Pelvis W Contrast  Result Date: 01/30/2022 CLINICAL DATA:  Right lower quadrant pain. EXAM: CT ABDOMEN AND PELVIS WITH CONTRAST TECHNIQUE: Multidetector CT imaging of the abdomen and pelvis  was performed using the standard protocol following bolus administration of intravenous contrast. RADIATION DOSE REDUCTION: This exam was performed according to the departmental dose-optimization program which includes automated exposure control, adjustment of the mA and/or kV according to patient size and/or use of iterative reconstruction technique. CONTRAST:  120mL OMNIPAQUE IOHEXOL 300 MG/ML  SOLN COMPARISON:  03/04/2021 FINDINGS: Lower Chest: No acute findings. Hepatobiliary: No hepatic masses identified. Gallbladder is unremarkable. No evidence of biliary ductal dilatation. Pancreas:  No mass or inflammatory changes. Spleen: Within normal limits in size and appearance. Adrenals/Urinary Tract: No masses identified. No evidence of ureteral calculi or hydronephrosis. Stomach/Bowel: Mild wall thickening is seen involving the terminal ileum and several distal small bowel loops. Transition point is seen in the central pelvis, with moderate small bowel dilatation and some feces proximal to this point, consistent with small-bowel obstruction. Mild soft tissue stranding is seen within right lower quadrant and pelvic mesenteric fat, with tiny amount of free fluid. This shows mild increase compared to previous study. No evidence of abscess or free air. Vascular/Lymphatic: No pathologically enlarged lymph nodes. No acute vascular findings. Reproductive: Prior hysterectomy noted. Adnexal regions are unremarkable in appearance. Other:  None. Musculoskeletal:  No suspicious bone lesions identified. IMPRESSION: Low-grade distal small bowel obstruction, with transition point in the central pelvis. Mild wall thickening involving terminal ileum and several distal small bowel loops, with mild mesenteric stranding and minimal free fluid, mildly increased since previous study. Differential diagnosis includes Crohn disease, and infectious or other inflammatory etiologies of enteritis. No evidence of abscess or free intraperitoneal  air. Electronically Signed   By: Marlaine Hind M.D.   On: 01/30/2022 14:32   DG Abd Portable 1V-Small Bowel Obstruction Protocol-initial, 8 hr delay  Result Date: 01/31/2022 CLINICAL DATA:  Small bowel obstruction, 8 hour follow-up EXAM: PORTABLE ABDOMEN - 1 VIEW COMPARISON:  Yesterday FINDINGS: Enteric contrast is seen within the proximal colon. The enteric tube is folded in the stomach. Contrast in the bladder from recent enhanced scan. IMPRESSION: 1. Enteric contrast has reached the colon. No gas dilated bowel; distended loops on prior CT were fluid-filled and likely underestimated radiographically. 2. Enteric tube which is folded in the stomach. Electronically Signed   By: Jorje Guild M.D.   On: 01/31/2022 06:32   DG Abd Portable 1V-Small Bowel Protocol-Position Verification  Result Date: 01/30/2022 CLINICAL DATA:  Status post nasogastric tube placement. EXAM: PORTABLE ABDOMEN - 1 VIEW COMPARISON:  11/16/2016 FINDINGS: Nasogastric tube is in place, side port in the region the distal esophagus, tip within the proximal stomach. Consider advancing 2 6-7 centimeters. Lung bases are unremarkable. Visualized bowel gas pattern is nonobstructive. IMPRESSION: Nasogastric tube to the very proximal stomach. Electronically Signed   By: Nolon Nations M.D.   On: 01/30/2022 18:12    Anti-infectives: Anti-infectives (From admission, onward)    None        Assessment/Plan SBO -adv to soft diet today and see how she tolerates -would ideally like for her to have a BM prior to discharge -explained that discomfort can last for several days after resolution of obstruction, but will monitor -mobilize/pulm toilet  FEN - soft diet, SLIV VTE - lovenox ID - none  Straightforward Medical Decision Making  LOS: 2 days    Nicole Gilbert , [facility] 02/01/2022, 9:24 AM Please see Amion for pager number during day hours 7:00am-4:30pm or 7:00am -11:30am on weekends

## 2022-02-02 ENCOUNTER — Inpatient Hospital Stay (HOSPITAL_COMMUNITY): Payer: BC Managed Care – PPO

## 2022-02-02 MED ORDER — METRONIDAZOLE 500 MG PO TABS
500.0000 mg | ORAL_TABLET | Freq: Two times a day (BID) | ORAL | Status: DC
Start: 2022-02-02 — End: 2022-02-03
  Administered 2022-02-02 – 2022-02-03 (×3): 500 mg via ORAL
  Filled 2022-02-02 (×3): qty 1

## 2022-02-02 MED ORDER — CIPROFLOXACIN HCL 500 MG PO TABS
500.0000 mg | ORAL_TABLET | Freq: Two times a day (BID) | ORAL | Status: DC
  Administered 2022-02-02 – 2022-02-03 (×3): 500 mg via ORAL
  Filled 2022-02-02 (×3): qty 1

## 2022-02-02 MED ORDER — BISACODYL 10 MG RE SUPP
10.0000 mg | Freq: Once | RECTAL | Status: AC
  Administered 2022-02-02: 10 mg via RECTAL
  Filled 2022-02-02: qty 1

## 2022-02-02 NOTE — Progress Notes (Addendum)
? ? ?   ?Subjective: ?Ate some yesterday, but not much due to diet restrictions she has with gluten etc.  No BM yet.  Still with RLQ abdominal cramping ? ?ROS: See above, otherwise other systems negative ? ?Objective: ?Vital signs in last 24 hours: ?Temp:  [98.1 ?F (36.7 ?C)-98.4 ?F (36.9 ?C)] 98.4 ?F (36.9 ?C) (03/01 0535) ?Pulse Rate:  [72-79] 76 (03/01 0535) ?Resp:  [18] 18 (03/01 0535) ?BP: (134-164)/(68-95) 138/68 (03/01 0535) ?SpO2:  [99 %-100 %] 100 % (03/01 0535) ?Last BM Date : 01/30/22 ? ?Intake/Output from previous day: ?02/28 0701 - 03/01 0700 ?In: 161 [P.O.:480; I.V.:175] ?Out: 2750 [Urine:2750] ?Intake/Output this shift: ?No intake/output data recorded. ? ?PE: ?Gen: NAD ?Abd: soft, mildly tender in RLQ, ND, +Bs ? ?Lab Results:  ?Recent Labs  ?  01/30/22 ?1257 01/30/22 ?0960  ?WBC 4.9 5.8  ?HGB 13.7 13.7  ?HCT 41.9 42.0  ?PLT 278 284  ? ?BMET ?Recent Labs  ?  01/30/22 ?1257 01/30/22 ?1719  ?NA 138  --   ?K 4.0  --   ?CL 106  --   ?CO2 26  --   ?GLUCOSE 121*  --   ?BUN 13  --   ?CREATININE 0.75 0.69  ?CALCIUM 9.9  --   ? ?PT/INR ?No results for input(s): LABPROT, INR in the last 72 hours. ?CMP  ?   ?Component Value Date/Time  ? NA 138 01/30/2022 1257  ? K 4.0 01/30/2022 1257  ? CL 106 01/30/2022 1257  ? CO2 26 01/30/2022 1257  ? GLUCOSE 121 (H) 01/30/2022 1257  ? BUN 13 01/30/2022 1257  ? CREATININE 0.69 01/30/2022 1719  ? CALCIUM 9.9 01/30/2022 1257  ? PROT 7.5 01/30/2022 1257  ? ALBUMIN 4.0 01/30/2022 1257  ? AST 27 01/30/2022 1257  ? ALT 22 01/30/2022 1257  ? ALKPHOS 48 01/30/2022 1257  ? BILITOT 1.2 01/30/2022 1257  ? GFRNONAA >60 01/30/2022 1719  ? GFRAA >60 09/30/2017 1000  ? ?Lipase  ?   ?Component Value Date/Time  ? LIPASE 39 01/30/2022 1257  ? ? ? ? ? ?Studies/Results: ?DG Abd Portable 1V ? ?Result Date: 02/02/2022 ?CLINICAL DATA:  Abdominal pain, constipation EXAM: PORTABLE ABDOMEN - 1 VIEW COMPARISON:  KUB 01/31/2022, CT abdomen/pelvis 01/30/2022 FINDINGS: There is enteric contrast throughout  the colon the level of the distal descending/sigmoid colon. There is no gas distended bowel. The enteric catheter has been removed. There is no definite free intraperitoneal air, within the confines of supine technique There is no abnormal soft tissue calcification. The bones are stable. IMPRESSION: Enteric contrast has progressed throughout the colon to the level of the distal descending/sigmoid colon. No gas distended loops of bowel are seen. Electronically Signed   By: Valetta Mole M.D.   On: 02/02/2022 07:49   ? ?Anti-infectives: ?Anti-infectives (From admission, onward)  ? ? None  ? ?  ? ? ? ?Assessment/Plan ?SBO/enteritis ?-cont soft diet ?-dulcolax suppository today to help with a BM, daily miralax ?-review of CT scan does show RLQ SB wall thickening.  Had a normal c-scope 1 year so IBD less likely.  More likely to be a viral type enteritis, but will rediscuss with MD ?-may need GI follow up as outpatient ?-given persistent pain, repeated a plain film today which is normal ? ?ADDENDUM: ?Will add cipro/Flagyl today due to enteritis findings on CT scan and persistent RLQ pain to see if this starts to help ? ?FEN - soft diet, SLIV ?VTE - lovenox ?ID - none ? ?  Straightforward Medical Decision Making ? LOS: 3 days  ? ? ?Henreitta Cea , PA-C ?[facility] ?02/02/2022, 9:38 AM ?Please see Amion for pager number during day hours 7:00am-4:30pm or 7:00am -11:30am on weekends ? ?

## 2022-02-03 MED ORDER — METRONIDAZOLE 500 MG PO TABS: 500.0000 mg | ORAL_TABLET | Freq: Two times a day (BID) | ORAL | 0 refills | Status: AC

## 2022-02-03 MED ORDER — CIPROFLOXACIN HCL 500 MG PO TABS: 500.0000 mg | ORAL_TABLET | Freq: Two times a day (BID) | ORAL | 0 refills | Status: AC

## 2022-02-03 MED ORDER — TRAMADOL HCL 50 MG PO TABS: 50.0000 mg | ORAL_TABLET | Freq: Four times a day (QID) | ORAL | 0 refills | Status: DC | PRN

## 2022-02-03 NOTE — TOC Transition Note (Signed)
Transition of Care (TOC) - CM/SW Discharge Note ? ? ?Patient Details  ?Name: Nicole Gilbert ?MRN: [id number] ?Date of Birth: 22-Jun-1961 ? ?Transition of Care [facility]) CM/SW Contact:  ?Leeroy Cha, RN ?Phone Number: ?02/03/2022, 9:38 AM ? ? ?Clinical Narrative:    ?Dcd to home with no toc needs ? ? ?Final next level of care: Home/Self Care ?Barriers to Discharge: No Barriers Identified ? ? ?Patient Goals and CMS Choice ?Patient states their goals for this hospitalization and ongoing recovery are:: to go home ?CMS Medicare.gov Compare Post Acute Care list provided to:: Patient ?  ? ?Discharge Placement ?  ?           ?  ?  ?  ?  ? ?Discharge Plan and Services ?  ?Discharge Planning Services: CM Consult ?           ?  ?  ?  ?  ?  ?  ?  ?  ?  ?  ? ?Social Determinants of Health (SDOH) Interventions ?  ? ? ?Readmission Risk Interventions ?No flowsheet data found. ? ? ? ? ?

## 2022-02-03 NOTE — Progress Notes (Signed)
Discharge instructions given to patient and all questions were answered.  

## 2022-02-03 NOTE — Discharge Summary (Signed)
? ? ?Patient ID: ?Nicole Gilbert ?[id number] ?1961-09-27 61 y.o. ? ?Admit date: 01/30/2022 ?Discharge date: 02/03/2022 ? ?Admitting Diagnosis: ?SBO/enteritis ? ?Discharge Diagnosis ?Patient Active Problem List  ? Diagnosis Date Noted  ? Small bowel obstruction ([place]) 01/30/2022  ? Abnormal CT of the abdomen 03/26/2021  ? History of colonic polyps 03/26/2021  ? Abnormal finding on GI tract imaging   ? Abdominal pain 03/04/2021  ? Enterocolitis 03/04/2021  ? HLD (hyperlipidemia) 03/04/2021  ? Low iron 04/21/2017  ? RLQ abdominal pain 11/22/2016  ? Abdominal pain, epigastric 11/22/2016  ? Microcytosis 11/22/2016  ? Dyspepsia 10/29/2014  ? Primary osteoarthritis of right knee 10/09/2014  ? Abdominal pain, unspecified site 08/21/2013  ? Dizziness 07/14/2013  ? Abdominal pain, left lower quadrant 01/21/2013  ? OA (osteoarthritis) of knee 10/04/2012  ? Synovitis of knee 10/04/2012  ? Family history of colon cancer 08/03/2012  ? 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 BUCKET HANDLE TEAR OF LATERAL MENISCUS 02/09/2009  ? SCIATICA 01/29/2009  ? BACK PAIN 01/29/2009  ? KNEE, ARTHRITIS, DEGEN./OSTEO 01/15/2009  ? JOINT EFFUSION, RIGHT KNEE 01/15/2009  ? KNEE PAIN 01/15/2009  ? ANXIETY 08/13/2008  ? NEUROSIS 08/13/2008  ? HEMORRHOIDS 08/13/2008  ? GERD 08/13/2008  ? CONSTIPATION, CHRONIC 08/13/2008  ? WEIGHT LOSS 08/13/2008  ? NAUSEA 08/13/2008  ? DYSPHAGIA UNSPECIFIED 08/13/2008  ? HEMATOCHEZIA, HX OF 08/13/2008  ? ? ?Consultants ?none ? ?Reason for Admission: ?Patient is a 61 year old female admitted from the emergency department with 24-hour history of abdominal pain, nausea, and emesis.  Patient had a similar episode in April 2022 for which she was admitted.  Patient had generalized abdominal discomfort.  She developed nausea.  She had a large emesis here in the emergency department.  Laboratory studies show a normal white blood count, normal electrolytes, and normal liver enzymes.  Patient underwent CT scan of the abdomen and pelvis  which shows a distal small bowel obstruction likely due to an adhesion in the pelvis with some thickening of the distal ileum.  Patient has a history of colonoscopy approximately 1 year ago which was normal.  Past surgical history is notable for cesarean section.  She has had no other abdominal surgery.  Patient works as a Conservation officer, historic buildings at [employer] ? ?Procedures ?none ? ?Hospital Course:  ?The patient was admitted and an NGT was placed.  She was started on the SBO protocol and contrast made it through to her colon within 8 hrs.  Her NGT was able to be removed and her diet was able to be advanced fairly quickly, but she wasn't terribly hungry.  She continued to have some crampy discomfort in her RLQ.  Her WBC was never elevated so suspicion for infectious enteritis was low, but we did initiate Cipro/Flagyl to see if this would help.  Her pain did improve some with this.  Her pain was around a 4 and managed well enough that on HD 4 she was felt stable for DC home.  She did have return of bowel function.  Of note, the patient was found to have a c-scope 1 year ago that was overall normal, but only 5 cm of TI was able to be evaluated.  The indications for this procedure were secondary to RLQ inflammation of the TI, similar to this admission.  It is likely prudent for the patient to follow up with GI as an outpatient for further work up given the recurrence of this issue and unclear etiology. ? ?Physical Exam: ?Abd: soft, minimally  tender in RLQ, +BS, ND ? ?Allergies as of 02/03/2022   ? ?   Reactions  ? Morphine And Related Nausea And Vomiting  ? Shellfish Allergy Hives, Itching  ? ?  ? ?  ?Medication List  ?  ? ?TAKE these medications   ? ?acetaminophen 500 MG tablet ?Commonly known as: TYLENOL ?Take 1,000 mg by mouth every 6 (six) hours as needed for headache. ?  ?cholecalciferol 25 MCG (1000 UNIT) tablet ?Commonly known as: VITAMIN D3 ?Take 2,000 Units by mouth daily. ?  ?ciprofloxacin 500 MG tablet ?Commonly  known as: CIPRO ?Take 1 tablet (500 mg total) by mouth 2 (two) times daily for 6 days. ?  ?ELDERBERRY PO ?Take 2 tablets by mouth daily. ?  ?fluticasone 50 MCG/ACT nasal spray ?Commonly known as: FLONASE ?Place 1 spray into both nostrils daily as needed for allergies. ?  ?gabapentin 100 MG capsule ?Commonly known as: NEURONTIN ?Take 100 mg by mouth daily as needed (pain). ?  ?hydrochlorothiazide 12.5 MG capsule ?Commonly known as: MICROZIDE ?Take 12.5 mg by mouth daily as needed (swelling). ?  ?hydroxychloroquine 200 MG tablet ?Commonly known as: PLAQUENIL ?Take 200 mg by mouth 2 (two) times daily. ?  ?metroNIDAZOLE 500 MG tablet ?Commonly known as: FLAGYL ?Take 1 tablet (500 mg total) by mouth 2 (two) times daily for 6 days. ?  ?multivitamin-iron-minerals-folic acid chewable tablet ?Chew 1 tablet by mouth daily. ?  ?PROBIOTIC PO ?Take 1 capsule by mouth daily. ?  ?psyllium 58.6 % powder ?Commonly known as: METAMUCIL ?Take 1 packet by mouth daily. ?  ?traMADol 50 MG tablet ?Commonly known as: ULTRAM ?Take 1 tablet (50 mg total) by mouth every 6 (six) hours as needed for moderate pain. ?  ?vitamin B-12 100 MCG tablet ?Commonly known as: CYANOCOBALAMIN ?Take 200 mcg by mouth daily. ?  ?vitamin C 500 MG tablet ?Commonly known as: ASCORBIC ACID ?Take 1,000 mg by mouth daily. ?  ? ?  ? ? ? ? Follow-up Information   ? ? [facility], [facility] Follow up.   ?Specialty: General Surgery ?Why: As needed ?Contact information: ?[address] ?[address] ?[place] [address] ?[phone] ? ? ?  ?  ? ? Berkley Harvey, NP Follow up.   ?Specialty: Nurse Practitioner ?Why: As needed if symptoms don't improve ?Contact information: ?[address] ?[address] ?[place] [address] ?[phone] ? ? ?  ?  ? ? Cirigliano, [place], DO Follow up in 1 month(s).   ?Specialty: Gastroenterology ?Why: Call to follow up given this is similar to your episode 1 year ago. ?Contact information: ?[address] ?[address] ?[place]  [address] ?[phone] ? ? ?  ?  ? ?  ?  ? ?  ? ?<30 minutes spent for DC summary ? ?Signed: ?Saverio Danker, PA-C ?[facility] ?02/03/2022, 9:33 AM ?Please see Amion for pager number during day hours 7:00am-4:30pm, 7-11:30am on Weekends ? ? ?

## 2022-02-03 NOTE — TOC Initial Note (Signed)
Transition of Care (TOC) - Initial/Assessment Note  ? ? ?Patient Details  ?Name: Nicole Gilbert ?MRN: [id number] ?Date of Birth: 1961-04-02 ? ?Transition of Care [facility]) CM/SW Contact:    ?Leeroy Cha, RN ?Phone Number: ?02/03/2022, 8:36 AM ? ?Clinical Narrative:                 ? ?Transition of Care (TOC) Screening Note ? ? ?Patient Details  ?Name: Nicole Gilbert ?Date of Birth: [birth year] ? ? ?Transition of Care [facility]) CM/SW Contact:    ?Leeroy Cha, RN ?Phone Number: ?02/03/2022, 8:36 AM ? ? ? ?Transition of Care Department [facility] has reviewed patient and no TOC needs have been identified at this time. We will continue to monitor patient advancement through interdisciplinary progression rounds. If new patient transition needs arise, please place a TOC consult. ? ? ? ?  ?  ? ? ?Patient Goals and CMS Choice ?  ?  ?  ? ?Expected Discharge Plan and Services ?  ?  ?  ?  ?  ?                ?  ?  ?  ?  ?  ?  ?  ?  ?  ?  ? ?Prior Living Arrangements/Services ?  ?  ?  ?       ?  ?  ?  ?  ? ?Activities of Daily Living ?Home Assistive Devices/Equipment: None ?ADL Screening (condition at time of admission) ?Patient's cognitive ability adequate to safely complete daily activities?: Yes ?Is the patient deaf or have difficulty hearing?: No ?Does the patient have difficulty seeing, even when wearing glasses/contacts?: No ?Does the patient have difficulty concentrating, remembering, or making decisions?: No ?Patient able to express need for assistance with ADLs?: Yes ?Does the patient have difficulty dressing or bathing?: No ?Independently performs ADLs?: Yes (appropriate for developmental age) ?Does the patient have difficulty walking or climbing stairs?: No ?Weakness of Legs: None ?Weakness of Arms/Hands: None ? ?Permission Sought/Granted ?  ?  ?   ?   ?   ?   ? ?Emotional Assessment ?  ?  ?  ?  ?  ?  ? ?Admission diagnosis:  Small bowel obstruction ([place]) [K56.609] ?Encounter for imaging study to confirm nasogastric (NG) tube  placement [Z01.89] ?Patient Active Problem List  ? Diagnosis Date Noted  ? Small bowel obstruction ([place]) 01/30/2022  ? Abnormal CT of the abdomen 03/26/2021  ? History of colonic polyps 03/26/2021  ? Abnormal finding on GI tract imaging   ? Abdominal pain 03/04/2021  ? Enterocolitis 03/04/2021  ? HLD (hyperlipidemia) 03/04/2021  ? Low iron 04/21/2017  ? RLQ abdominal pain 11/22/2016  ? Abdominal pain, epigastric 11/22/2016  ? Microcytosis 11/22/2016  ? Dyspepsia 10/29/2014  ? Primary osteoarthritis of right knee 10/09/2014  ? Abdominal pain, unspecified site 08/21/2013  ? Dizziness 07/14/2013  ? Abdominal pain, left lower quadrant 01/21/2013  ? OA (osteoarthritis) of knee 10/04/2012  ? Synovitis of knee 10/04/2012  ? Family history of colon cancer 08/03/2012  ? 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 BUCKET HANDLE TEAR OF LATERAL MENISCUS 02/09/2009  ? SCIATICA 01/29/2009  ? BACK PAIN 01/29/2009  ? KNEE, ARTHRITIS, DEGEN./OSTEO 01/15/2009  ? JOINT EFFUSION, RIGHT KNEE 01/15/2009  ? KNEE PAIN 01/15/2009  ? ANXIETY 08/13/2008  ? NEUROSIS 08/13/2008  ? HEMORRHOIDS 08/13/2008  ? GERD 08/13/2008  ? CONSTIPATION, CHRONIC 08/13/2008  ? WEIGHT LOSS 08/13/2008  ? NAUSEA 08/13/2008  ? DYSPHAGIA UNSPECIFIED  08/13/2008  ? HEMATOCHEZIA, HX OF 08/13/2008  ? ?PCP:  Berkley Harvey, NP ?Pharmacy:   ?[place], [place] - [address] [facility] ?[address] ?[place] [address] ?Phone: [phone] Fax: [phone] ? ?[id number] [address] ?[address] ?[place] [address] ?Phone: [phone] Fax: [phone] ? ? ? ? ?Social Determinants of Health (SDOH) Interventions ?  ? ?Readmission Risk Interventions ?No flowsheet data found. ? ? ?

## 2022-02-07 ENCOUNTER — Ambulatory Visit: Payer: BC Managed Care – PPO | Admitting: Gastroenterology

## 2022-02-07 ENCOUNTER — Encounter: Payer: Self-pay | Admitting: Gastroenterology

## 2022-02-07 VITALS — BP 126/84 | HR 70 | Ht 61.0 in | Wt 157.4 lb

## 2022-02-07 DIAGNOSIS — Z86010 Personal history of colonic polyps: Secondary | ICD-10-CM | POA: Diagnosis not present

## 2022-02-07 DIAGNOSIS — R1031 Right lower quadrant pain: Secondary | ICD-10-CM

## 2022-02-07 DIAGNOSIS — R935 Abnormal findings on diagnostic imaging of other abdominal regions, including retroperitoneum: Secondary | ICD-10-CM

## 2022-02-07 NOTE — Progress Notes (Signed)
? ?Chief Complaint:    Nausea, abdominal pain, hospital follow-up ? ?GI History: 61 year old female ? ?-02/2021: Hospital admission with 3-week history of nausea and RLQ abdominal pain.  General Surgery felt acute appendicitis less likely.  Treated with IV Zosyn then Augmentin.  Normal ESR, CRP, lactate, CBC ?- 02/2021: CT A/P: Nondistended small bowels with fecalization of the distal ileum.  Colon decompressed and appendix not identified with mild inflammatory changes along the distal cecum.  DDx includes infectious or inflammatory enterocolitis predominantly distal ileum.  Alternatively could represent ruptured appendicitis with reactive inflammatory changes. ?- 03/26/2021: Evaluation in the GI clinic.  Started on dicyclomine and supplemental fiber ?- 04/21/2021: Colonoscopy: Normal colon.  Scar in distal rectum consistent with prior hemorrhoid surgery.  Grade 2 internal hemorrhoids.  Normal TI, but due to significant looping only able to advance 5 cm into ileum.  Recommended repeat imaging ?- 06/17/2021: MR enterography: Normal ?- 2/26- 3/2, 2023: Hospital admission with recurrence of RLQ pain.  Normal CBC, CMP, ESR, CRP, lipase, lactic.  Diagnosed with SBO on CT and treated supportively and Cipro/Flagyl for possible infectious enteritis (although low suspicion).  Suspected SBO 2/2 pelvic adhesions ?- 01/30/2022: CT A/P: Mild wall thickening in terminal ileum and several distal small bowel loops with transition point in central pelvis with moderate small bowel dilation and some feces proximal to this point c/w SBO.   ? ? ?Endoscopic History: ?- Colonoscopy (2013): Hyperplastic polyps, tubular adenoma.  Slight granular appearance to the colon throughout (path benign).  Normal TI. ?- EGD (02/2013): Mild antral erosions ?- EGD (06/2014): Benign esophageal nodule, otherwise unremarkable ?- Colonoscopy (04/2021): Normal colon.  Scar in distal rectum consistent with prior hemorrhoid surgery.  Grade 2 internal hemorrhoids.   Normal TI, but due to significant looping only able to advance 5 cm into ileum. ? ?HPI:   ? ? ?Patient is a 61 y.o. female presenting to the Gastroenterology Clinic for hospital follow-up.  She was admitted 2/26 -3/2 with SBO.  She was treated with NGT, Cipro/Flagyl (for possible infectious enteritis), and supportive care, and discharged home. Sxs were same as hospital admission in 02/2021.  ? ?Today, she is still taking Cipro/Flagyl and soft diet.  Still with intermittent abdominal discomfort/cramping and RLQ.  Normal bowel habits. ? ?Prior to hospitalization, no hematochezia, melena, d/c/n/v.  ? ? ?Review of systems:     No chest pain, no SOB, no fevers, no urinary sx  ? ?Past Medical History:  ?Diagnosis Date  ? Anxiety   ? Chronic constipation   ? GERD (gastroesophageal reflux disease)   ? Hemorrhoids 2006  ? Hyperlipemia   ? Insomnia   ? ? ?Patient's surgical history, family medical history, social history, medications and allergies were all reviewed in Epic  ? ? ?Current Outpatient Medications  ?Medication Sig Dispense Refill  ? acetaminophen (TYLENOL) 500 MG tablet Take 1,000 mg by mouth every 6 (six) hours as needed for headache.    ? cholecalciferol (VITAMIN D3) 25 MCG (1000 UNIT) tablet Take 2,000 Units by mouth daily.    ? ciprofloxacin (CIPRO) 500 MG tablet Take 1 tablet (500 mg total) by mouth 2 (two) times daily for 6 days. 12 tablet 0  ? ELDERBERRY PO Take 2 tablets by mouth daily.    ? metroNIDAZOLE (FLAGYL) 500 MG tablet Take 1 tablet (500 mg total) by mouth 2 (two) times daily for 6 days. 12 tablet 0  ? fluticasone (FLONASE) 50 MCG/ACT nasal spray Place 1 spray into both nostrils daily  as needed for allergies. (Patient not taking: Reported on 02/07/2022)    ? gabapentin (NEURONTIN) 100 MG capsule Take 100 mg by mouth daily as needed (pain). (Patient not taking: Reported on 02/07/2022)    ? hydrochlorothiazide (MICROZIDE) 12.5 MG capsule Take 12.5 mg by mouth daily as needed (swelling). (Patient not  taking: Reported on 02/07/2022)    ? hydroxychloroquine (PLAQUENIL) 200 MG tablet Take 200 mg by mouth 2 (two) times daily. (Patient not taking: Reported on 02/07/2022)    ? multivitamin-iron-minerals-folic acid (CENTRUM) chewable tablet Chew 1 tablet by mouth daily. (Patient not taking: Reported on 02/07/2022)    ? Probiotic Product (PROBIOTIC PO) Take 1 capsule by mouth daily. (Patient not taking: Reported on 02/07/2022)    ? psyllium (METAMUCIL) 58.6 % powder Take 1 packet by mouth daily.    ? traMADol (ULTRAM) 50 MG tablet Take 1 tablet (50 mg total) by mouth every 6 (six) hours as needed for moderate pain. (Patient not taking: Reported on 02/07/2022) 10 tablet 0  ? vitamin B-12 (CYANOCOBALAMIN) 100 MCG tablet Take 200 mcg by mouth daily. (Patient not taking: Reported on 02/07/2022)    ? vitamin C (ASCORBIC ACID) 500 MG tablet Take 1,000 mg by mouth daily. (Patient not taking: Reported on 02/07/2022)    ? ?No current facility-administered medications for this visit.  ? ? ?Physical Exam:   ? ? ?BP 126/84   Pulse 70   Ht 5' 1"  (1.549 m)   Wt 157 lb 6 oz (71.4 kg)   SpO2 98%   BMI 29.74 kg/m?  ? ?GENERAL:  Pleasant female in NAD ?PSYCH: Cooperative, normal affect ?Musculoskeletal:  Normal muscle tone, normal strength ?NEURO: Alert and oriented x 3, no focal neurologic deficits ? ? ?IMPRESSION and PLAN:   ? ?1) RLQ pain/Abdominal cramping ?2) Abnormal CT ?3) Small bowel obstruction with recent hospitalization ? ?She had an extensive work-up in 2022, to include normal colonoscopy, normal MR enterography.  Normal inflammatory markers on both hospitalizations.  I do not think this represents Crohn's Disease given the normal interval MR enterography and normal colonoscopy.  Am suspicious about SBO 2/2 adhesive disease. ? ?- Bentyl prn abdominal cramping ?- Probiotic x4 weeks while finishing Abx course ?- Referral to surgery for consideration of diagnostic laparoscopy with LOA ?- We discussed the role/utility of repeat  colonoscopy and repeat cross-sectional imaging.  Based on the normal work-up in 2022 demonstrating normalization after hospitalization and lack of chronicity, do not feel these would add much value at this juncture.  Similarly she had normal ESR/CRP, so repeat inflammatory markers or labs not needed right now ? ?4) History of colon polyps ?- Repeat colonoscopy in 2032 ? ?- RTC prn ? ?    ?    ? ?Lavena Bullion ,DO, FACG 02/07/2022, 11:33 AM ? ?

## 2022-02-07 NOTE — Patient Instructions (Addendum)
If you are age 63 or older, your body mass index should be between 23-30. Your Body mass index is 29.74 kg/m?Marland Kitchen If this is out of the aforementioned range listed, please consider follow up with your Primary Care Provider. ? ?If you are age 109 or younger, your body mass index should be between 19-25. Your Body mass index is 29.74 kg/m?Marland Kitchen If this is out of the aformentioned range listed, please consider follow up with your Primary Care Provider.  ? ?________________________________________________________ ? ?The [facility] providers would like to encourage you to use [facility] to communicate with providers for non-urgent requests or questions.  Due to long hold times on the telephone, sending your provider a message by [facility] may be a faster and more efficient way to get a response.  Please allow 48 business hours for a response.  Please remember that this is for non-urgent requests.  ?_______________________________________________________  ? ?We will be sending a referral to [facility]. They will call you to schedule. If you havent heard anything please call [phone] ? ?Please take an probiotic for 4 weeks and then stop.  ? ?Please call with any questions or concerns. ? ?It was a pleasure to see you today! ? ?Gerrit Heck, D.O. ? ?

## 2022-02-08 ENCOUNTER — Telehealth: Payer: Self-pay

## 2022-02-08 NOTE — Telephone Encounter (Signed)
Spoke to [place] from [place] and she was able to look this patient up in epic and she she will send a message to the referral coordinator to see about this patient getting seen since the referral couldn't go through after many tries using 2 different fax numbers ?

## 2022-03-01 ENCOUNTER — Ambulatory Visit: Payer: BC Managed Care – PPO | Admitting: Obstetrics & Gynecology

## 2022-07-20 ENCOUNTER — Other Ambulatory Visit: Payer: Self-pay

## 2022-07-20 ENCOUNTER — Emergency Department (HOSPITAL_BASED_OUTPATIENT_CLINIC_OR_DEPARTMENT_OTHER): Payer: BC Managed Care – PPO

## 2022-07-20 ENCOUNTER — Emergency Department (HOSPITAL_BASED_OUTPATIENT_CLINIC_OR_DEPARTMENT_OTHER)
Admission: EM | Admit: 2022-07-20 | Discharge: 2022-07-20 | Disposition: A | Discharge status: Home / Self Care | Payer: BC Managed Care – PPO | Attending: Emergency Medicine | Admitting: Emergency Medicine

## 2022-07-20 ENCOUNTER — Encounter (HOSPITAL_BASED_OUTPATIENT_CLINIC_OR_DEPARTMENT_OTHER): Payer: Self-pay

## 2022-07-20 DIAGNOSIS — Z79899 Other long term (current) drug therapy: Secondary | ICD-10-CM | POA: Diagnosis not present

## 2022-07-20 DIAGNOSIS — R519 Headache, unspecified: Secondary | ICD-10-CM | POA: Diagnosis present

## 2022-07-20 LAB — CBC
HCT: 40.9 % (ref 36.0–46.0)
Hemoglobin: 13.3 g/dL (ref 12.0–15.0)
MCH: 26.3 pg (ref 26.0–34.0)
MCHC: 32.5 g/dL (ref 30.0–36.0)
MCV: 81 fL (ref 80.0–100.0)
Platelets: 290 10*3/uL (ref 150–400)
RBC: 5.05 MIL/uL (ref 3.87–5.11)
RDW: 14.2 % (ref 11.5–15.5)
WBC: 3.7 10*3/uL — ABNORMAL LOW (ref 4.0–10.5)
nRBC: 0 % (ref 0.0–0.2)

## 2022-07-20 LAB — BASIC METABOLIC PANEL
Anion gap: 8 (ref 5–15)
BUN: 11 mg/dL (ref 8–23)
CO2: 26 mmol/L (ref 22–32)
Calcium: 10 mg/dL (ref 8.9–10.3)
Chloride: 101 mmol/L (ref 98–111)
Creatinine, Ser: 0.74 mg/dL (ref 0.44–1.00)
GFR, Estimated: >60 mL/min (ref >60)
Glucose, Bld: 93 mg/dL (ref 70–99)
Potassium: 4 mmol/L (ref 3.5–5.1)
Sodium: 135 mmol/L (ref 135–145)

## 2022-07-20 LAB — URINALYSIS, ROUTINE W REFLEX MICROSCOPIC
Bilirubin Urine: NEGATIVE
Glucose, UA: NEGATIVE mg/dL
Hgb urine dipstick: NEGATIVE
Ketones, ur: NEGATIVE mg/dL
Leukocytes,Ua: NEGATIVE
Nitrite: NEGATIVE
Protein, ur: NEGATIVE mg/dL
Specific Gravity, Urine: 1.009 (ref 1.005–1.030)
pH: 6 (ref 5.0–8.0)

## 2022-07-20 LAB — CBG MONITORING, ED: Glucose-Capillary: 92 mg/dL (ref 70–99)

## 2022-07-20 MED ORDER — LACTATED RINGERS IV BOLUS
1000.0000 mL | Freq: Once | INTRAVENOUS | Status: AC
Start: 2022-07-20 — End: 2022-07-20
  Administered 2022-07-20: 1000 mL via INTRAVENOUS

## 2022-07-20 MED ORDER — METOCLOPRAMIDE HCL 5 MG/ML IJ SOLN
10.0000 mg | Freq: Once | INTRAMUSCULAR | Status: AC
  Administered 2022-07-20: 10 mg via INTRAVENOUS
  Filled 2022-07-20: qty 2

## 2022-07-20 MED ORDER — DIPHENHYDRAMINE HCL 50 MG/ML IJ SOLN
12.5000 mg | Freq: Once | INTRAMUSCULAR | Status: DC | PRN
Start: 2022-07-20 — End: 2022-07-21

## 2022-07-20 MED ORDER — DEXAMETHASONE SODIUM PHOSPHATE 10 MG/ML IJ SOLN
10.0000 mg | Freq: Once | INTRAMUSCULAR | Status: AC
  Administered 2022-07-20: 10 mg via INTRAVENOUS
  Filled 2022-07-20: qty 1

## 2022-07-20 MED ORDER — DIPHENHYDRAMINE HCL 50 MG/ML IJ SOLN: 25.0000 mg | Freq: Once | INTRAMUSCULAR | Status: DC

## 2022-07-20 MED ORDER — DIPHENHYDRAMINE HCL 50 MG/ML IJ SOLN
12.5000 mg | Freq: Once | INTRAMUSCULAR | Status: AC
Start: 2022-07-20 — End: 2022-07-20
  Administered 2022-07-20: 12.5 mg via INTRAVENOUS
  Filled 2022-07-20: qty 1

## 2022-07-20 MED ORDER — KETOROLAC TROMETHAMINE 15 MG/ML IJ SOLN
15.0000 mg | Freq: Once | INTRAMUSCULAR | Status: AC
  Administered 2022-07-20: 15 mg via INTRAVENOUS
  Filled 2022-07-20: qty 1

## 2022-07-20 NOTE — ED Triage Notes (Signed)
Patient here POV from Home.  Patient was awakened Last PM at 2300 Yesterday by Headache. Symptoms have progressed since and now Include Right Sided Facial Numbness, Blurry Vision and Lightheadedness.  LKW was 2200 Last PM when patient went to Sleep.  Uncomfortable during Triage. A&Ox4. GCS 15. BIB Wheelchair.

## 2022-07-20 NOTE — ED Notes (Signed)
Pt escorted patient and friend to room, friend requested to push her in a wheelchair. This RN gave patient a gown and asked her to remove her jewelry, bra and clothing for CT scan. Person with her asked this RN to close the curtain to assist with changing her clothes. Pt brought to room with two blankets from triage. Friend at bedside requesting additional blankets

## 2022-07-20 NOTE — ED Notes (Addendum)
Complaint is headache since last night. Located back right of head. No numbness/tingling, blurry vision or any other aliment. Just pain. Rates it a 8/10. Hx of headaches. Contacted her PCP, they stated that bc this event sounds different, to be seen at the ER.

## 2022-07-20 NOTE — ED Notes (Signed)
Discharge paperwork given and verbally understood. 

## 2022-07-20 NOTE — ED Provider Notes (Signed)
[facility] EMERGENCY DEPT Provider Note   CSN: [id number] Arrival date & time: 07/20/22  1243     History Chief Complaint  Patient presents with   Headache    Nicole Gilbert is a 61 y.o. 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 She reports blurry vision and lightheadedness. She was send over by her PCP for further evaluation. She denies any chest pain, SOB, fever, rhinorrhea, nasal congestion, nausea, vomiting. She reports that she does feel some lightheadedness.  Denies any unilateral weakness.  Denies any neck pain or stiffness.   Headache Associated symptoms: no congestion, no fever, no nausea and no vomiting        Home Medications Prior to Admission medications   Medication Sig Start Date End Date Taking? Authorizing Provider  acetaminophen (TYLENOL) 500 MG tablet Take 1,000 mg by mouth every 6 (six) hours as needed for headache.    [provider]  cholecalciferol (VITAMIN D3) 25 MCG (1000 UNIT) tablet Take 2,000 Units by mouth daily.    [provider]  ELDERBERRY PO Take 2 tablets by mouth daily.    [provider]  hydroxychloroquine (PLAQUENIL) 200 MG tablet Take 200 mg by mouth 2 (two) times daily. Patient not taking: Reported on 02/07/2022    [provider]  psyllium (METAMUCIL) 58.6 % powder Take 1 packet by mouth daily.    [provider]  traMADol (ULTRAM) 50 MG tablet Take 1 tablet (50 mg total) by mouth every 6 (six) hours as needed for moderate pain. Patient not taking: Reported on 02/07/2022 02/03/22   Saverio Danker, PA-C      Allergies    Morphine and related and Shellfish allergy    Review of Systems   Review of Systems  Constitutional:  Negative for chills and fever.  HENT:  Negative for congestion and rhinorrhea.   Respiratory:  Negative for shortness of breath.   Cardiovascular:  Negative for chest  pain.  Gastrointestinal:  Negative for nausea and vomiting.  Neurological:  Positive for light-headedness and headaches.    Physical Exam Updated Vital Signs BP (!) 170/106 (BP Location: Left Arm)   Pulse 72   Temp 98 F (36.7 C)   Resp 20   Ht '5\' 1"'$  (1.549 m)   Wt 69.9 kg   SpO2 98%   BMI 29.10 kg/m  Physical Exam Vitals and nursing note reviewed.  Constitutional:      General: She is not in acute distress.    Appearance: Normal appearance. She is not toxic-appearing.  HENT:     Head: Normocephalic and atraumatic.  Eyes:     General: No scleral icterus.    Extraocular Movements: Extraocular movements intact.     Pupils: Pupils are equal, round, and reactive to light.     Comments: Patient does have some photophobia  Cardiovascular:     Rate and Rhythm: Normal rate and regular rhythm.  Pulmonary:     Effort: Pulmonary effort is normal.     Breath sounds: Normal breath sounds.  Abdominal:     General: Abdomen is flat. Bowel sounds are normal.     Palpations: Abdomen is soft.     Tenderness: There is no abdominal tenderness. There is no guarding.  Musculoskeletal:        General: No deformity.     Cervical back: Normal range of motion and neck supple. No rigidity.  Skin:    General: Skin is warm and dry.  Neurological:     General: No focal deficit present.     Mental Status: She is alert. Mental status is at baseline.     GCS: GCS eye subscore is 4. GCS verbal subscore is 5. GCS motor subscore is 6.     Cranial Nerves: No cranial nerve deficit, dysarthria or facial asymmetry.     Motor: No weakness or pronator drift.     Coordination: Finger-Nose-Finger Test normal.     Comments: Patient answering questions appropriately with appropriate speech.  Cranial nerves II through XII intact.  No facial asymmetry noted.  She reports a mild decrease in sensation in her right cheek.  No pronator drift.  Strength is equal in patient's upper and lower bilateral extremities.   Normal finger-nose.     ED Results / Procedures / Treatments   Labs (all labs ordered are listed, but only abnormal results are displayed) Labs Reviewed  CBC - Abnormal; Notable for the following components:      Result Value   WBC 3.7 (*)    All other components within normal limits  BASIC METABOLIC PANEL  URINALYSIS, ROUTINE W REFLEX MICROSCOPIC  CBG MONITORING, ED    EKG None  Radiology CT Head Wo Contrast  Result Date: 07/20/2022 CLINICAL DATA:  Headache with right facial numbness and blurry vision. EXAM: CT HEAD WITHOUT CONTRAST TECHNIQUE: Contiguous axial images were obtained from the base of the skull through the vertex without intravenous contrast. RADIATION DOSE REDUCTION: This exam was performed according to the departmental dose-optimization program which includes automated exposure control, adjustment of the mA and/or kV according to patient size and/or use of iterative reconstruction technique. COMPARISON:  Brain MRI 08/13/2015 FINDINGS: Brain: There is no acute intracranial hemorrhage, extra-axial fluid collection, or acute infarct. Parenchymal volume is normal. The ventricles are normal in size. Gray-white differentiation is preserved. There is no mass lesion.  There is no mass effect or midline shift. Vascular: No hyperdense vessel or unexpected calcification. Skull: Normal. Negative for fracture or focal lesion. Sinuses/Orbits: The imaged paranasal sinuses are clear. The globes and orbits are unremarkable. Other: None. IMPRESSION: No acute intracranial pathology. Electronically Signed   By: Valetta Mole M.D.   On: 07/20/2022 13:56     Procedures Procedures   Medications Ordered in ED Medications  lactated ringers bolus 1,000 mL (has no administration in time range)  ketorolac (TORADOL) 15 MG/ML injection 15 mg (has no administration in time range)  metoCLOPramide (REGLAN) injection 10 mg (has no administration in time range)  dexamethasone (DECADRON) injection 10 mg  (has no administration in time range)    ED Course/ Medical Decision Making/ A&P                           Medical Decision Making Amount and/or Complexity of Data Reviewed Labs: ordered. Radiology: ordered.  Risk Prescription drug management.   61 year old female presents the emergency department for evaluation of headache with photophobia.  Differential diagnosis includes is not limited to electrolyte abnormality, migraine headache, bad headache, meningitis, subarachnoid hemorrhage, intracranial bleed, stroke, TIA, complex migraine.  Vital signs show elevated blood pressure otherwise unremarkable.  Physical exam as noted above.  Mild decrease in sensation of her right cheek.   1 L LR as well as Toradol, Decadron, and Reglan given.  Patient was starting to have some jitteriness from the Reglan so some Benadryl was given.  We  will order CT imaging and basic labs.  I independently reviewed and interpreted the patient's labs.  Mild leukopenia at 3.7.  Patient reports that this is from her new medication she was placed on and is known by her primary care office.  No anemia seen.  BMP shows no electrolyte abnormality.  Kidney function well.  CBG at 92.  Urinalysis shows colorless urine otherwise normal.  CT imaging read as No acute intracranial pathology.  My attending assessed at bedside. He likely thinks this is a complex migraine and recommends further imaging and consult with neuro if not improved by the medications.   Friend at bedside was concerned for possible aneurysm as they read in her personal family and the patient's family.  On further discussion with the patient, she would like to forego this imaging at this time as she is feeling better after the headache medications.  She does not have the level of photophobia she had before.  She reports she is feeling vastly much better and would like to go home. She no longer has any decreased sensation in her cheek. Again, I suggested that  we could do some CT imaging if makes her feel more comfortable, patient has declined this again.  She reports that she were to have worsening headache she will come back for a another scan.  I think this is reasonable.  I agree with my attending, I do not think this is any TIA or stroke given that it is improved with the medication.  She has no meningeal signs.  She has no other neurodeficits.  CT did not show any signs of intracranial bleed or hemorrhage.  We discussed her lab and imaging.  Discussed brain rest and staying well-hydrated.  The patient is following with her primary care office for soon for reevaluation.  Recommend that she discuss her recurrence of her migraine headaches for further evaluation.  We discussed return precautions and red flag symptoms.  Patient verbalized understanding and agrees to the plan.  Patient is stable and being discharged home in good condition.  I discussed this case with my attending physician who cosigned this note including patient's presenting symptoms, physical exam, and planned diagnostics and interventions. Attending physician stated agreement with plan or made changes to plan which were implemented.   Attending physician assessed patient at bedside.  Final Clinical Impression(s) / ED Diagnoses Final diagnoses:  Bad headache    Rx / DC Orders ED Discharge Orders     None         Sherrell Puller, PA-C 07/25/22 2251    Varney Biles, MD 07/29/22 1430

## 2022-07-20 NOTE — Discharge Instructions (Signed)
You were seen here in the ED for evaluation of your headache. I am glad you are feeling better now. Please make sure you are staying well hydrated and staying well rested.  Please follow-up with your primary care doctor at your scheduled appointment tomorrow.  Patiently, I am including information for a neurologist for you to follow-up with.  Please call to schedule appointment.  If you have any concern, new or worsening symptoms, please return to the nearest emergency department for evaluation.  Contact a doctor if: Medicine does not help your symptoms. You have a headache that feels different than the other headaches. You feel like you may vomit (nauseous) or you vomit. You have a fever. Get help right away if: Your headache: Gets very bad quickly. Gets worse after a lot of physical activity. You have any of these symptoms: You continue to vomit. A stiff neck. Trouble seeing. Your eye or ear hurts. Trouble speaking. Weak muscles or you lose muscle control. You lose your balance or have trouble walking. You feel like you will pass out (faint) or you pass out. You are mixed up (confused). You have a seizure. These symptoms may be an emergency. Get help right away. Call your local emergency services (911 in the [place] Do not wait to see if the symptoms will go away. Do not drive yourself to the hospital.

## 2022-09-06 NOTE — Progress Notes (Unsigned)
Referring:  Varney Biles, [place],  [place] [address]  PCP: Berkley Harvey, NP  Neurology was asked to evaluate Nicole Gilbert, a 61 year old female for a chief complaint of headaches.  Our recommendations of care will be communicated by shared medical record.    CC:  headaches  History provided from self  HPI:  Medical co-morbidities: Sjogren's syndrome, rheumatoid arthritis, migraines, depression, anxiety, HTN  The patient presents for evaluation of headaches which began several years ago. She recently presented to the ED 07/20/22 for headaches, where [facility] was unremarkable. Notes that she was diagnosed with COVID last week and her headaches were much worse around this time. They have improved in severity this week. Headaches are described as constant throbbing at the top of her head with associated photophobia and phonophobia. Takes Tylenol and ibuprofen as needed which are typically not helpful. She is currently taking these at least twice a day every day. She recently started amitriptyline for anxiety/depression and just increased the dose to 10 mg QHS last week.   Notes her mother passed away recently from a brain bleed due to aneurysm.  She had a normal MRI of the brain in 2016 and feels headaches have been about the same since then.  Headache History: Onset: several years ago Triggers: stress Aura: no Location: vertex, retro-orbital Quality/Description: throbbing Associated Symptoms:  Photophobia: yes  Phonophobia: yes  Nausea: no Worse with activity?: yes Duration of headaches: constant  Headache days per month: 30 Headache free days per month: 0  Current Treatment: Abortive Tylenol Ibuprofen  Preventative none  Prior Therapies                                 Tylenol Ibuprofen Topamax - lack of efficacy, side effects Amitriptyline 10 mg QHS Trigger point injections  LABS: CBC    Component Value Date/Time   WBC 3.7 (L) 07/20/2022  1300   RBC 5.05 07/20/2022 1300   HGB 13.3 07/20/2022 1300   HCT 40.9 07/20/2022 1300   PLT 290 07/20/2022 1300   MCV 81.0 07/20/2022 1300   MCH 26.3 07/20/2022 1300   MCHC 32.5 07/20/2022 1300   RDW 14.2 07/20/2022 1300   LYMPHSABS 1.2 01/30/2022 1257   MONOABS 0.5 01/30/2022 1257   EOSABS 0.1 01/30/2022 1257   BASOSABS 0.0 01/30/2022 1257      Latest Ref Rng & Units 07/20/2022    1:00 PM 01/30/2022    5:19 PM 01/30/2022   12:57 PM  CMP  Glucose 70 - 99 mg/dL 93   121   BUN 8 - 23 mg/dL 11   13   Creatinine 0.44 - 1.00 mg/dL 0.74  0.69  0.75   Sodium 135 - 145 mmol/L 135   138   Potassium 3.5 - 5.1 mmol/L 4.0   4.0   Chloride 98 - 111 mmol/L 101   106   CO2 22 - 32 mmol/L 26   26   Calcium 8.9 - 10.3 mg/dL 10.0   9.9   Total Protein 6.5 - 8.1 g/dL   7.5   Total Bilirubin 0.3 - 1.2 mg/dL   1.2   Alkaline Phos 38 - 126 U/L   48   AST 15 - 41 U/L   27   ALT 0 - 44 U/L   22      IMAGING:  CTH 07/20/22: unremarkable  Imaging independently reviewed on  September 07, 2022   Current Outpatient Medications on File Prior to Visit  Medication Sig Dispense Refill   acetaminophen (TYLENOL) 500 MG tablet Take 1,000 mg by mouth every 6 (six) hours as needed for headache.     amitriptyline (ELAVIL) 10 MG tablet Take 5-10 mg by mouth at bedtime.     cholecalciferol (VITAMIN D3) 25 MCG (1000 UNIT) tablet Take 2,000 Units by mouth daily.     ELDERBERRY PO Take 2 tablets by mouth daily.     hydrochlorothiazide (MICROZIDE) 12.5 MG capsule Take 12.5 mg by mouth daily.     hydroxychloroquine (PLAQUENIL) 200 MG tablet Take 200 mg by mouth 2 (two) times daily.     methocarbamol (ROBAXIN) 500 MG tablet Take 500 mg by mouth 3 (three) times daily as needed.     psyllium (METAMUCIL) 58.6 % powder Take 1 packet by mouth daily.     traMADol (ULTRAM) 50 MG tablet Take 1 tablet (50 mg total) by mouth every 6 (six) hours as needed for moderate pain. (Patient not taking: Reported on 02/07/2022) 10 tablet 0    No current facility-administered medications on file prior to visit.     Allergies: Allergies  Allergen Reactions   Morphine And Related Nausea And Vomiting   Shellfish Allergy Hives and Itching    Family History: Migraine or other headaches in the family:  no Aneurysms in a first degree relative:  yes Brain tumors in the family:  no Other neurological illness in the family:   no  Past Medical History: Past Medical History:  Diagnosis Date   Anxiety    Chronic constipation    GERD (gastroesophageal reflux disease)    Hemorrhoids 2006   Hyperlipemia    Insomnia     Past Surgical History Past Surgical History:  Procedure Laterality Date   ABDOMINAL HYSTERECTOMY     BIOPSY  06/25/2014   Dr. Geanie Logan features are not identified   Carpal tunnel right hand     CESAREAN SECTION     x2   COLONOSCOPY   05/19/2008   Rourk-Prominent internal hemorrhoids and anal papilla, otherwise normal appearing rectal mucosa / Colonic mucosa appeared normal except for melanosis coli   COLONOSCOPY  08/22/2012   Rourk- hyperplastic polyps and tubular adenoma, query infectious colitis vs NSAID effect. Next TCS 08/2017.   ESOPHAGOGASTRODUODENOSCOPY   01/20/2005   Rourk- Normal-appearing esophagus, status post passage of a 105 French Maloney  dilator/ small HH/  Otherwise normal stomach   ESOPHAGOGASTRODUODENOSCOPY N/A 02/06/2013   [id number] esophagus. Small hiatal hernia. Antral erosions: bx no h.pylori.   ESOPHAGOGASTRODUODENOSCOPY N/A 06/25/2014   Dr. Myra Gianotti nodule-status post biopsy; otherwise, normalEGD. No endoscopic explanation for patient's symptoms   HEMORRHOID SURGERY  06/30/2008   Dr Arnoldo Morale   KNEE ARTHROSCOPY     2010, RIGHT KNEE     Social History: Social History   Tobacco Use   Smoking status: Never   Smokeless tobacco: Never  Vaping Use   Vaping Use: Never used  Substance Use Topics   Alcohol use: Yes    Comment: rare   Drug use: No    ROS: Negative  for fevers, chills. Positive for headaches. All other systems reviewed and negative unless stated otherwise in HPI.   Physical Exam:   Vital Signs: BP 131/76   Pulse 78   Ht '5\' 1"'$  (1.549 m)   Wt 159 lb (72.1 kg)   BMI 30.04 kg/m  GENERAL: well appearing,in no acute distress,alert SKIN:  Color, texture,  turgor normal. No rashes or lesions HEAD:  Normocephalic/atraumatic. CV:  RRR RESP: Normal respiratory effort MSK: no tenderness to palpation over occiput, neck, or shoulders  NEUROLOGICAL: Mental Status: Alert, oriented to person, place and time,Follows commands Cranial Nerves: PERRL, visual fields intact to confrontation, extraocular movements intact, facial sensation intact, no facial droop or ptosis, hearing grossly intact, no dysarthria Motor: muscle strength 5/5 both upper and lower extremities,no drift, normal tone Reflexes: 2+ throughout Sensation: intact to light touch all 4 extremities Coordination: Finger-to- nose-finger intact bilaterally Gait: normal-based   IMPRESSION: 61 year old female with a history of Sjogren's syndrome, rheumatoid arthritis, migraines, depression, anxiety, HTN who presents for evaluation of daily headaches. Recent CTH was unremarkable. Feels headaches are about the same as they were since her prior MRI in 2016. Neurological exam is normal today. Will order CTA of the head for aneurysm screening as her mother recently passed away from a ruptured aneurysm. Headaches are most consistent with chronic migraines. She likely also has a component of medication overuse headache in the setting of daily Tylenol use. Counseled on limiting rescue medications to avoid rebound headaches. She recently increased amitriptyline which may help reduce her headaches. Advised her to continue this dose for a few weeks and see if headaches improve. Could consider increasing dose further if needed. Will start Maxalt for migraine rescue.   PLAN: -CTA head -Prevention:  continue amitriptyline 10 mg QHS as prescribed by outside provider, consider increasing dose if no improvement in headaches over the next few weeks -Rescue: Start Maxalt 10 mg PRN  I spent a total of 30 minutes chart reviewing and counseling the patient. Headache education was done. Discussed treatment options including preventive and acute medications. Discussed medication overuse headache and to limit use of acute treatments to no more than 2 days/week or 10 days/month. Discussed medication side effects, adverse reactions and drug interactions. Written educational materials and patient instructions outlining all of the above were given.  Follow-up: 3 months   Genia Harold, MD 09/07/2022   2:17 PM

## 2022-09-07 ENCOUNTER — Ambulatory Visit: Payer: BC Managed Care – PPO | Admitting: Psychiatry

## 2022-09-07 VITALS — BP 131/76 | HR 78 | Ht 61.0 in | Wt 159.0 lb

## 2022-09-07 DIAGNOSIS — Z8249 Family history of ischemic heart disease and other diseases of the circulatory system: Secondary | ICD-10-CM

## 2022-09-07 DIAGNOSIS — G43709 Chronic migraine without aura, not intractable, without status migrainosus: Secondary | ICD-10-CM | POA: Diagnosis not present

## 2022-09-07 MED ORDER — RIZATRIPTAN BENZOATE 10 MG PO TABS: 10.0000 mg | ORAL_TABLET | ORAL | 6 refills | Status: DC | PRN

## 2022-09-07 NOTE — Patient Instructions (Addendum)
Start rizatriptan as needed for migraines. Take at the onset of migraine. If headache recurs or does not fully resolve, you may take a second dose after 2 hours. Please avoid taking more than 2 days per week to avoid rebound headaches  Continue amitriptyline at current dose for now. Let me know if headaches do not improve after a few weeks and we can increase the dose  Please limit tylenol and ibuprofen use to 2 days per week to avoid rebound headaches

## 2022-09-09 ENCOUNTER — Telehealth: Payer: Self-pay | Admitting: Psychiatry

## 2022-09-09 NOTE — Telephone Encounter (Signed)
[employer] via [employer] sent to GI [phone] 

## 2022-12-19 ENCOUNTER — Ambulatory Visit: Payer: BC Managed Care – PPO | Admitting: Adult Health

## 2022-12-19 ENCOUNTER — Encounter: Payer: Self-pay | Admitting: Adult Health

## 2022-12-19 VITALS — BP 119/82 | HR 77 | Ht 61.0 in | Wt 164.2 lb

## 2022-12-19 DIAGNOSIS — G43709 Chronic migraine without aura, not intractable, without status migrainosus: Secondary | ICD-10-CM

## 2022-12-19 NOTE — Progress Notes (Signed)
Referring:  Berkley Harvey, NP [place],  [place] [id number]  PCP: Berkley Harvey, NP  Neurology was asked to evaluate Nicole Gilbert, a 62 year old female for a chief complaint of headaches.  Our recommendations of care will be communicated by shared medical record.    CC:  headaches  History provided from self  Follow-up visit:  Prior visit: 09/07/2022 with Dr. Billey Gosling (initial consult visit)   Brief HPI:   Nicole Gilbert is a 62 y.o. female who is being followed for chronic migraine headaches. CTH unremarkable.  Normal MRI brain 2016, headaches have remained the same since that time. Was started on amitriptyline by outside provider shortly prior to prior visit for anxiety/depression.  At prior visit, recommended continuation of amitriptyline as headaches improved some since starting and use of rizatriptan as needed.  Recommend completion of CTA head to rule out aneurysm as mother recently passed away from aneurysm rupture, this has not yet been completed.   Interval history:  Reports significant improvement of migraine headaches since prior visit, currently taking amitriptyline 10 mg nightly, has experienced 1-2 mild migraines over the past month. Did try rizatriptan but caused fatigue. Will take tylenol as needed with benefit. Does not increased stress can trigger migraines.      Headache History:  Headache days per month: 1-2 Headache free days per month: 28-29  Current Treatment: Abortive Tylenol  Preventative Amitriptyline 10 mg nightly  Prior Therapies                                 Tylenol Ibuprofen Topamax - lack of efficacy, side effects Amitriptyline 10 mg QHS Trigger point injections Rizatriptan   LABS: CBC    Component Value Date/Time   WBC 3.7 (L) 07/20/2022 1300   RBC 5.05 07/20/2022 1300   HGB 13.3 07/20/2022 1300   HCT 40.9 07/20/2022 1300   PLT 290 07/20/2022 1300   MCV 81.0 07/20/2022 1300   MCH 26.3 07/20/2022 1300    MCHC 32.5 07/20/2022 1300   RDW 14.2 07/20/2022 1300   LYMPHSABS 1.2 01/30/2022 1257   MONOABS 0.5 01/30/2022 1257   EOSABS 0.1 01/30/2022 1257   BASOSABS 0.0 01/30/2022 1257      Latest Ref Rng & Units 07/20/2022    1:00 PM 01/30/2022    5:19 PM 01/30/2022   12:57 PM  CMP  Glucose 70 - 99 mg/dL 93   121   BUN 8 - 23 mg/dL 11   13   Creatinine 0.44 - 1.00 mg/dL 0.74  0.69  0.75   Sodium 135 - 145 mmol/L 135   138   Potassium 3.5 - 5.1 mmol/L 4.0   4.0   Chloride 98 - 111 mmol/L 101   106   CO2 22 - 32 mmol/L 26   26   Calcium 8.9 - 10.3 mg/dL 10.0   9.9   Total Protein 6.5 - 8.1 g/dL   7.5   Total Bilirubin 0.3 - 1.2 mg/dL   1.2   Alkaline Phos 38 - 126 U/L   48   AST 15 - 41 U/L   27   ALT 0 - 44 U/L   22      IMAGING:  CTH 07/20/22: unremarkable  Imaging independently reviewed on December 19, 2022   Current Outpatient Medications on File Prior to Visit  Medication Sig Dispense Refill  acetaminophen (TYLENOL) 500 MG tablet Take 1,000 mg by mouth every 6 (six) hours as needed for headache.     amitriptyline (ELAVIL) 10 MG tablet Take 10 mg by mouth at bedtime.     cholecalciferol (VITAMIN D3) 25 MCG (1000 UNIT) tablet Take 2,000 Units by mouth daily.     ELDERBERRY PO Take 2 tablets by mouth daily.     hydrochlorothiazide (MICROZIDE) 12.5 MG capsule Take 12.5 mg by mouth daily.     hydroxychloroquine (PLAQUENIL) 200 MG tablet Take 200 mg by mouth 2 (two) times daily.     methocarbamol (ROBAXIN) 500 MG tablet Take 500 mg by mouth 3 (three) times daily as needed.     psyllium (METAMUCIL) 58.6 % powder Take 1 packet by mouth daily.     traMADol (ULTRAM) 50 MG tablet Take 1 tablet (50 mg total) by mouth every 6 (six) hours as needed for moderate pain. (Patient not taking: Reported on 02/07/2022) 10 tablet 0   No current facility-administered medications on file prior to visit.     Allergies: Allergies  Allergen Reactions   Morphine And Related Nausea And Vomiting    Shellfish Allergy Hives and Itching    Family History: Migraine or other headaches in the family:  no Aneurysms in a first degree relative:  yes Brain tumors in the family:  no Other neurological illness in the family:   no  Past Medical History: Past Medical History:  Diagnosis Date   Anxiety    Chronic constipation    GERD (gastroesophageal reflux disease)    Hemorrhoids 2006   Hyperlipemia    Insomnia     Past Surgical History Past Surgical History:  Procedure Laterality Date   ABDOMINAL HYSTERECTOMY     BIOPSY  06/25/2014   Dr. Geanie Logan features are not identified   Carpal tunnel right hand     CESAREAN SECTION     x2   COLONOSCOPY   05/19/2008   Rourk-Prominent internal hemorrhoids and anal papilla, otherwise normal appearing rectal mucosa / Colonic mucosa appeared normal except for melanosis coli   COLONOSCOPY  08/22/2012   Rourk- hyperplastic polyps and tubular adenoma, query infectious colitis vs NSAID effect. Next TCS 08/2017.   ESOPHAGOGASTRODUODENOSCOPY   01/20/2005   Rourk- Normal-appearing esophagus, status post passage of a 42 French Maloney  dilator/ small HH/  Otherwise normal stomach   ESOPHAGOGASTRODUODENOSCOPY N/A 02/06/2013   [id number] esophagus. Small hiatal hernia. Antral erosions: bx no h.pylori.   ESOPHAGOGASTRODUODENOSCOPY N/A 06/25/2014   Dr. Myra Gianotti nodule-status post biopsy; otherwise, normalEGD. No endoscopic explanation for patient's symptoms   HEMORRHOID SURGERY  06/30/2008   Dr Arnoldo Morale   KNEE ARTHROSCOPY     2010, RIGHT KNEE     Social History: Social History   Tobacco Use   Smoking status: Never   Smokeless tobacco: Never  Vaping Use   Vaping Use: Never used  Substance Use Topics   Alcohol use: Yes    Comment: rare   Drug use: No    ROS: Negative for fevers, chills. Positive for headaches. All other systems reviewed and negative unless stated otherwise in HPI.   Physical Exam:   Vital Signs: BP 119/82 (BP  Location: Right Arm, Patient Position: Sitting, Cuff Size: Normal)   Pulse 77   Ht '5\' 1"'$  (1.549 m)   Wt 164 lb 3.2 oz (74.5 kg)   BMI 31.03 kg/m  GENERAL: well appearing,in no acute distress,alert SKIN:  Color, texture, turgor normal. No rashes or lesions HEAD:  Normocephalic/atraumatic. CV:  RRR RESP: Normal respiratory effort MSK: no tenderness to palpation over occiput, neck, or shoulders  NEUROLOGICAL: Mental Status: Alert, oriented to person, place and time,Follows commands Cranial Nerves: PERRL, visual fields intact to confrontation, extraocular movements intact, facial sensation intact, no facial droop or ptosis, hearing grossly intact, no dysarthria Motor: muscle strength 5/5 both upper and lower extremities,no drift, normal tone Reflexes: 2+ throughout Sensation: intact to light touch all 4 extremities Coordination: Finger-to- nose-finger intact bilaterally Gait: normal-based     IMPRESSION: 62 year old female with a history of Sjogren's syndrome, rheumatoid arthritis, migraines, depression, anxiety, HTN who presented for evaluation of daily headaches on 09/07/2022 with Dr. Billey Gosling. CTH was unremarkable.  Recommended completion of CTA head for aneurysm screening as mother recently passed away from ruptured aneurysm, not yet completed. Felt headaches most consistent with chronic migraines component of medication overuse in setting of daily Tylenol use.   PLAN: -CTA head - advised to contact [facility] (OV number provided) to schedule, patient wishes to hold off at this time but will call if interested in pursuing in the future, blood pressure well controlled, denies tobacco use -Prevention: continue amitriptyline 10 mg QHS as prescribed by outside provider -Rescue: use of tylenol as needed. Avoid overuse due to rebound    Follow up as needed at this time   I spent 16 minutes of face-to-face and non-face-to-face time with patient.  This included previsit chart  review, lab review, study review, order entry, electronic health record documentation, patient education and discussion regarding the above and answered all questions to patient's satisfaction  Frann Rider, [facility]  [facility] [address] [place],  [address]  Phone [phone] Fax [phone] Note: This document was prepared with digital dictation and possible smart phrase technology. Any transcriptional errors that result from this process are unintentional.

## 2022-12-19 NOTE — Patient Instructions (Signed)
Continue amitriptyline '10mg'$  nightly for headache management   Can use tylenol as needed for acute migraine management, if not beneficial please let me know and we can discuss other options  Please contact [facility] [phone]) to complete CTA head previously ordered by Dr. Billey Gosling

## 2023-10-06 ENCOUNTER — Other Ambulatory Visit: Payer: Self-pay

## 2023-10-06 ENCOUNTER — Ambulatory Visit
Admission: EM | Admit: 2023-10-06 | Discharge: 2023-10-06 | Disposition: A | Discharge status: Home / Self Care | Payer: BC Managed Care – PPO | Attending: Internal Medicine | Admitting: Internal Medicine

## 2023-10-06 ENCOUNTER — Encounter: Payer: Self-pay | Admitting: *Deleted

## 2023-10-06 DIAGNOSIS — J029 Acute pharyngitis, unspecified: Secondary | ICD-10-CM

## 2023-10-06 DIAGNOSIS — J069 Acute upper respiratory infection, unspecified: Secondary | ICD-10-CM

## 2023-10-06 DIAGNOSIS — Z20822 Contact with and (suspected) exposure to covid-19: Secondary | ICD-10-CM

## 2023-10-06 DIAGNOSIS — U071 COVID-19: Secondary | ICD-10-CM | POA: Insufficient documentation

## 2023-10-06 HISTORY — DX: Rheumatoid arthritis, unspecified: M06.9

## 2023-10-06 HISTORY — DX: Age-related osteoporosis without current pathological fracture: M81.0

## 2023-10-06 LAB — POCT RAPID STREP A (OFFICE): Rapid Strep A Screen: NEGATIVE

## 2023-10-06 LAB — SARS CORONAVIRUS 2 (TAT 6-24 HRS): SARS Coronavirus 2: POSITIVE — AB

## 2023-10-06 MED ORDER — ACETAMINOPHEN 325 MG PO TABS: 975.0000 mg | ORAL_TABLET | Freq: Once | ORAL | Status: DC

## 2023-10-06 MED ORDER — BENZONATATE 100 MG PO CAPS: 100.0000 mg | ORAL_CAPSULE | Freq: Three times a day (TID) | ORAL | 0 refills | Status: DC | PRN

## 2023-10-06 NOTE — Discharge Instructions (Signed)
Strep was negative.  Throat culture and COVID test pending.  Will call if it is positive.  It appears that you have a viral illness that should run its course.  Follow-up if any symptoms persist or worsen.

## 2023-10-06 NOTE — ED Provider Notes (Signed)
[facility]    CSN: [id number] Arrival date & time: 10/06/23  1037      History   Chief Complaint Chief Complaint  Patient presents with   Cough    HPI Nicole Gilbert is a 62 y.o. female.   Patient presents with cough, chills, headache, hoarseness, sore throat that started yesterday.  Reports COVID exposure at work.  Denies any obvious fever.  Denies chest pain or shortness of breath.  Patient has not taken any medications for symptoms.  Denies history of asthma or COPD and patient denies that she smokes cigarettes.   Cough   Past Medical History:  Diagnosis Date   Anxiety    Chronic constipation    GERD (gastroesophageal reflux disease)    Hemorrhoids 2006   Hyperlipemia    Insomnia    Osteoporosis    RA (rheumatoid arthritis) (HCC)     Patient Active Problem List   Diagnosis Date Noted   Small bowel obstruction (HCC) 01/30/2022   Abnormal CT of the abdomen 03/26/2021   History of colonic polyps 03/26/2021   Abnormal finding on GI tract imaging    Abdominal pain 03/04/2021   Enterocolitis 03/04/2021   HLD (hyperlipidemia) 03/04/2021   Low iron 04/21/2017   RLQ abdominal pain 11/22/2016   Abdominal pain, epigastric 11/22/2016   Microcytosis 11/22/2016   Dyspepsia 10/29/2014   Primary osteoarthritis of right knee 10/09/2014   Abdominal pain 08/21/2013   Dizziness 07/14/2013   Abdominal pain, left lower quadrant 01/21/2013   OA (osteoarthritis) of knee 10/04/2012   Synovitis of knee 10/04/2012   Family history of colon cancer 08/03/2012   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 Mauri Reading features are not identified   Carpal tunnel right hand     CESAREAN SECTION     x2   COLONOSCOPY   05/19/2008   Rourk-Prominent internal hemorrhoids and anal papilla, otherwise normal appearing rectal mucosa / Colonic mucosa appeared normal except for melanosis coli   COLONOSCOPY  08/22/2012   Rourk- hyperplastic polyps and tubular adenoma, query infectious colitis vs NSAID effect. Next TCS 08/2017.   ESOPHAGOGASTRODUODENOSCOPY   01/20/2005   Rourk- Normal-appearing esophagus, status post passage of a 12 French Maloney  dilator/ small HH/  Otherwise normal stomach   ESOPHAGOGASTRODUODENOSCOPY N/A 02/06/2013   QMV:HQIONG esophagus. Small hiatal hernia. Antral erosions: bx no h.pylori.   ESOPHAGOGASTRODUODENOSCOPY N/A 06/25/2014   Dr. Leafy Kindle nodule-status post biopsy; otherwise, normalEGD. No endoscopic explanation for patient's symptoms   HEMORRHOID SURGERY  06/30/2008   Dr Lovell Sheehan   KNEE ARTHROSCOPY     2010, RIGHT KNEE     OB History   No obstetric history on file.      Home Medications    Prior to Admission medications   Medication Sig Start Date End Date Taking? Authorizing Provider  acetaminophen (TYLENOL) 500 MG tablet Take 1,000 mg by mouth every 6 (six) hours as needed  for headache.   Yes [provider]  albuterol (VENTOLIN HFA) 108 (90 Base) MCG/ACT inhaler INHALE 2 PUFFS INTO THE LUNGS EVERY 6 HOURS AS NEEDED FOR WHEEZE 09/09/22  Yes [provider]  amitriptyline (ELAVIL) 10 MG tablet Take 10 mg by mouth at bedtime. 08/01/22  Yes [provider]  benzonatate (TESSALON) 100 MG capsule Take 1 capsule (100 mg total) by mouth every 8 (eight) hours as needed for cough. 10/06/23  Yes Gionni Freese, Rolly Salter E, FNP  cholecalciferol (VITAMIN D3) 25  MCG (1000 UNIT) tablet Take 2,000 Units by mouth daily.   Yes [provider]  ELDERBERRY PO Take 2 tablets by mouth daily.   Yes [provider]  fluticasone (FLONASE) 50 MCG/ACT nasal spray 1 spray Once Daily. 03/02/20  Yes [provider]  hydrochlorothiazide (MICROZIDE) 12.5 MG capsule Take 12.5 mg by mouth daily. 07/21/22  Yes [provider]  hydrOXYzine (ATARAX) 10 MG tablet Take by mouth. 10/03/23  Yes [provider]  methocarbamol (ROBAXIN) 500 MG tablet Take 500 mg by mouth 3 (three) times daily as needed. 07/21/22  Yes [provider]  psyllium (METAMUCIL) 58.6 % powder Take 1 packet by mouth daily.   Yes [provider]  certolizumab pegol (CIMZIA) 200 MG/ML prefilled syringe Inject into the skin.    [provider]  denosumab (PROLIA) 60 MG/ML SOSY injection Inject into the skin.    [provider]  hydroxychloroquine (PLAQUENIL) 200 MG tablet Take 200 mg by mouth 2 (two) times daily.    [provider]  traMADol (ULTRAM) 50 MG tablet Take 1 tablet (50 mg total) by mouth every 6 (six) hours as needed for moderate pain. Patient not taking: Reported on 02/07/2022 02/03/22   Barnetta Chapel, PA-C    Family History Family History  Problem Relation Age of Onset   Diabetes Father    Colon cancer Brother 86       (paternal half brother)   Coronary artery disease Maternal Grandmother    Lung cancer Other     Social History Social History   Tobacco Use   Smoking status: Never   Smokeless tobacco: Never  Vaping Use   Vaping status: Never Used  Substance Use Topics   Alcohol use: Yes    Comment: rare   Drug use: No     Allergies   Morphine and codeine and Shellfish allergy   Review of Systems Review of Systems Per HPI  Physical Exam Triage Vital Signs ED Triage Vitals  Encounter Vitals Group     BP 10/06/23 1137 (!) 142/88     Systolic BP Percentile --      Diastolic BP Percentile  --      Pulse Rate 10/06/23 1137 78     Resp 10/06/23 1137 18     Temp 10/06/23 1137 98.5 F (36.9 C)     Temp Source 10/06/23 1137 Oral     SpO2 10/06/23 1137 98 %     Weight --      Height --      Head Circumference --      Peak Flow --      Pain Score 10/06/23 1133 10     Pain Loc --      Pain Education --      Exclude from Growth Chart --    No data found.  Updated Vital Signs BP (!) 142/88 (BP Location: Left Arm)   Pulse 78   Temp 98.5 F (36.9  C) (Oral)   Resp 18   SpO2 98%   Visual Acuity Right Eye Distance:   Left Eye Distance:   Bilateral Distance:    Right Eye Near:   Left Eye Near:    Bilateral Near:     Physical Exam Constitutional:      General: She is not in acute distress.    Appearance: Normal appearance. She is not toxic-appearing or diaphoretic.  HENT:     Head: Normocephalic and atraumatic.     Right Ear: Tympanic membrane and ear canal normal.     Left Ear: Tympanic membrane and ear canal normal.     Nose: Congestion present.     Mouth/Throat:     Mouth: Mucous membranes are moist.     Pharynx: Posterior oropharyngeal erythema present.     Comments: Hoarseness noted.  Eyes:     Extraocular Movements: Extraocular movements intact.     Conjunctiva/sclera: Conjunctivae normal.     Pupils: Pupils are equal, round, and reactive to light.  Cardiovascular:     Rate and Rhythm: Normal rate and regular rhythm.     Pulses: Normal pulses.     Heart sounds: Normal heart sounds.  Pulmonary:     Effort: Pulmonary effort is normal. No respiratory distress.     Breath sounds: Normal breath sounds. No stridor. No wheezing, rhonchi or rales.  Abdominal:     General: Abdomen is flat. Bowel sounds are normal.     Palpations: Abdomen is soft.  Musculoskeletal:        General: Normal range of motion.     Cervical back: Normal range of motion.  Skin:    General: Skin is warm and dry.  Neurological:     General: No focal deficit present.     Mental  Status: She is alert and oriented to person, place, and time. Mental status is at baseline.  Psychiatric:        Mood and Affect: Mood normal.        Behavior: Behavior normal.      UC Treatments / Results  Labs (all labs ordered are listed, but only abnormal results are displayed) Labs Reviewed  SARS CORONAVIRUS 2 (TAT 6-24 HRS)  CULTURE, GROUP A STREP [facility])  POCT RAPID STREP A (OFFICE)    EKG   Radiology No results found.  Procedures Procedures (including critical care time)  Medications Ordered in UC Medications - No data to display   Initial Impression / Assessment and Plan / UC Course  I have reviewed the triage vital signs and the nursing notes.  Pertinent labs & imaging results that were available during my care of the patient were reviewed by me and considered in my medical decision making (see chart for details).     Patient presents with symptoms likely from a viral upper respiratory infection.  Do not suspect underlying cardiopulmonary process. Symptoms seem unlikely related to ACS, CHF or COPD exacerbations, pneumonia, pneumothorax. Patient is nontoxic appearing and not in need of emergent medical intervention. Rapid strep negative. Throat culture and covid test pending. Highly suspicious of covid 19 given close exposure.   Recommended symptom control with medications, supportive care, fluids, rest.   Return if symptoms fail to improve in 1-2 weeks or you develop shortness of breath, chest pain, severe headache. Patient states understanding and is agreeable.  Discharged with PCP followup.  Final Clinical Impressions(s) / UC Diagnoses   Final diagnoses:  Close exposure to COVID-19 virus  Sore throat  Viral upper respiratory tract infection with cough     Discharge Instructions      Strep was negative.  Throat culture and COVID test pending.  Will call if it is positive.  It appears that you have a viral illness that should run its course.  Follow-up  if any symptoms persist or worsen.     ED Prescriptions     Medication Sig Dispense Auth. Provider   benzonatate (TESSALON) 100 MG capsule Take 1 capsule (100 mg total) by mouth every 8 (eight) hours as needed for cough. 21 capsule [place], Acie Fredrickson, [place]      PDMP not reviewed this encounter.   Gustavus Bryant, [place] 10/06/23 [phone]

## 2023-10-06 NOTE — ED Triage Notes (Signed)
Cough, chills, headache, lost voice x 1 day. States she was sent home from work. States exposed to covid at work

## 2023-10-09 ENCOUNTER — Ambulatory Visit (HOSPITAL_COMMUNITY)
Admission: EM | Admit: 2023-10-09 | Discharge: 2023-10-09 | Disposition: A | Discharge status: Home / Self Care | Payer: BC Managed Care – PPO | Attending: Internal Medicine | Admitting: Internal Medicine

## 2023-10-09 ENCOUNTER — Encounter (HOSPITAL_COMMUNITY): Payer: Self-pay

## 2023-10-09 DIAGNOSIS — J040 Acute laryngitis: Secondary | ICD-10-CM

## 2023-10-09 DIAGNOSIS — U071 COVID-19: Secondary | ICD-10-CM

## 2023-10-09 LAB — CULTURE, GROUP A STREP (THRC)

## 2023-10-09 MED ORDER — ALBUTEROL SULFATE HFA 108 (90 BASE) MCG/ACT IN AERS
1.0000 | INHALATION_SPRAY | Freq: Four times a day (QID) | RESPIRATORY_TRACT | 0 refills | Status: AC | PRN
Start: 2023-10-09 — End: ?

## 2023-10-09 MED ORDER — PAXLOVID (300/100) 20 X 150 MG & 10 X 100MG PO TBPK: 3.0000 | ORAL_TABLET | Freq: Two times a day (BID) | ORAL | 0 refills | Status: AC

## 2023-10-09 MED ORDER — HYDROCOD POLI-CHLORPHE POLI ER 10-8 MG/5ML PO SUER: 5.0000 mL | Freq: Two times a day (BID) | ORAL | 0 refills | Status: DC

## 2023-10-09 NOTE — Discharge Instructions (Addendum)
Humidifier use will help with nasal congestion, coughing and hoarseness of your voice Please take medications as directed Maintain adequate hydration Your lung exam is reassuring There is no indication for chest x-ray at this time You may take Tylenol or ibuprofen as needed for pain and/or fever Please feel free to return to urgent care if you have worsening symptoms.

## 2023-10-09 NOTE — ED Provider Notes (Signed)
[facility]    CSN: [id number] Arrival date & time: 10/09/23  0820      History   Chief Complaint Chief Complaint  Patient presents with   Hoarse    HPI Nicole Gilbert is a 62 y.o. female comes to the urgent care with worsening cough, chest tightness and shortness of breath and hoarseness of voice.  Patient was diagnosed with COVID infection..  Patient endorses some chest tightness but no chest pain.  She denies any fever but endorses some chills.  Cough is nonproductive.  No sore throat or difficulty swallowing.  No nausea, vomiting or diarrhea.  Patient continues to have some nasal congestion but denies any postnasal drainage.  Patient's symptoms started 4 days ago. HPI  Past Medical History:  Diagnosis Date   Anxiety    Chronic constipation    GERD (gastroesophageal reflux disease)    Hemorrhoids 2006   Hyperlipemia    Insomnia    Osteoporosis    RA (rheumatoid arthritis) (HCC)     Patient Active Problem List   Diagnosis Date Noted   Small bowel obstruction (HCC) 01/30/2022   Abnormal CT of the abdomen 03/26/2021   History of colonic polyps 03/26/2021   Abnormal finding on GI tract imaging    Abdominal pain 03/04/2021   Enterocolitis 03/04/2021   HLD (hyperlipidemia) 03/04/2021   Low iron 04/21/2017   RLQ abdominal pain 11/22/2016   Abdominal pain, epigastric 11/22/2016   Microcytosis 11/22/2016   Dyspepsia 10/29/2014   Primary osteoarthritis of right knee 10/09/2014   Abdominal pain 08/21/2013   Dizziness 07/14/2013   Abdominal pain, left lower quadrant 01/21/2013   OA (osteoarthritis) of knee 10/04/2012   Synovitis of knee 10/04/2012   Family history of colon cancer 08/03/2012   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 Mauri Reading features are not identified   Carpal tunnel right hand     CESAREAN SECTION     x2   COLONOSCOPY   05/19/2008   Rourk-Prominent internal hemorrhoids and anal papilla, otherwise normal appearing rectal mucosa / Colonic mucosa appeared normal except for melanosis coli   COLONOSCOPY  08/22/2012   Rourk- hyperplastic polyps and tubular adenoma, query infectious colitis vs NSAID effect. Next TCS 08/2017.   ESOPHAGOGASTRODUODENOSCOPY   01/20/2005   Rourk- Normal-appearing esophagus, status post passage of a 43 French Maloney  dilator/ small HH/  Otherwise normal stomach   ESOPHAGOGASTRODUODENOSCOPY N/A 02/06/2013   GNF:AOZHYQ esophagus. Small hiatal hernia. Antral erosions: bx no h.pylori.   ESOPHAGOGASTRODUODENOSCOPY N/A 06/25/2014   Dr. Leafy Kindle nodule-status post biopsy; otherwise, normalEGD. No endoscopic explanation for patient's symptoms   HEMORRHOID SURGERY  06/30/2008   Dr Lovell Sheehan   KNEE ARTHROSCOPY     2010, RIGHT KNEE     OB History   No obstetric history on file.      Home Medications    Prior to Admission medications   Medication Sig Start Date  End Date Taking? Authorizing Provider  chlorpheniramine-HYDROcodone (TUSSIONEX) 10-8 MG/5ML Take 5 mLs by mouth 2 (two) times daily. 10/09/23  Yes Mihira Tozzi, Britta Mccreedy, MD  nirmatrelvir/ritonavir (PAXLOVID, 300/100,) 20 x 150 MG & 10 x 100MG  TBPK Take 3 tablets by mouth 2 (two) times daily for 5 days. Patient GFR is greater than 60. Take nirmatrelvir (150 mg) two tablets twice daily for 5 days and ritonavir (100 mg) one tablet twice daily for 5 days. 10/09/23 10/14/23 Yes Yianna Tersigni, Britta Mccreedy, MD   acetaminophen (TYLENOL) 500 MG tablet Take 1,000 mg by mouth every 6 (six) hours as needed for headache.    [provider]  albuterol (VENTOLIN HFA) 108 (90 Base) MCG/ACT inhaler INHALE 2 PUFFS INTO THE LUNGS EVERY 6 HOURS AS NEEDED FOR WHEEZE 09/09/22   [provider]  amitriptyline (ELAVIL) 10 MG tablet Take 10 mg by mouth at bedtime. 08/01/22   [provider]  benzonatate (TESSALON) 100 MG capsule Take 1 capsule (100 mg total) by mouth every 8 (eight) hours as needed for cough. 10/06/23   Gustavus Bryant, FNP  certolizumab pegol (CIMZIA) 200 MG/ML prefilled syringe Inject into the skin.    [provider]  cholecalciferol (VITAMIN D3) 25 MCG (1000 UNIT) tablet Take 2,000 Units by mouth daily.    [provider]  denosumab (PROLIA) 60 MG/ML SOSY injection Inject into the skin.    [provider]  ELDERBERRY PO Take 2 tablets by mouth daily.    [provider]  fluticasone (FLONASE) 50 MCG/ACT nasal spray 1 spray Once Daily. 03/02/20   [provider]  hydrochlorothiazide (MICROZIDE) 12.5 MG capsule Take 12.5 mg by mouth daily. 07/21/22   [provider]  methocarbamol (ROBAXIN) 500 MG tablet Take 500 mg by mouth 3 (three) times daily as needed. 07/21/22   [provider]  psyllium (METAMUCIL) 58.6 % powder Take 1 packet by mouth daily.    [provider]    Family History Family History  Problem Relation Age of Onset   Diabetes Father    Colon cancer Brother 20       (paternal half brother)   Coronary artery disease Maternal Grandmother    Lung cancer Other     Social History Social History   Tobacco Use   Smoking status: Never   Smokeless tobacco: Never  Vaping Use   Vaping status: Never Used  Substance Use Topics   Alcohol use: Yes    Comment: rare   Drug use: No     Allergies   Morphine and codeine and Shellfish allergy   Review of Systems Review of Systems As per  HPI  Physical Exam Triage Vital Signs ED Triage Vitals  Encounter Vitals Group     BP 10/09/23 0904 120/80     Systolic BP Percentile --      Diastolic BP Percentile --      Pulse Rate 10/09/23 0904 75     Resp 10/09/23 0904 16     Temp 10/09/23 0904 98.3 F (36.8 C)     Temp Source 10/09/23 0904 Oral     SpO2 10/09/23 0904 97 %     Weight 10/09/23 0903 157 lb (71.2 kg)     Height 10/09/23 0903 5\' 1"  (1.549 m)     Head Circumference --      Peak Flow --      Pain Score 10/09/23 0903 8     Pain Loc --  Pain Education --      Exclude from Growth Chart --    No data found.  Updated Vital Signs BP 120/80 (BP Location: Left Arm)   Pulse 75   Temp 98.3 F (36.8 C) (Oral)   Resp 16   Ht 5\' 1"  (1.549 m)   Wt 71.2 kg   SpO2 97%   BMI 29.66 kg/m   Visual Acuity Right Eye Distance:   Left Eye Distance:   Bilateral Distance:    Right Eye Near:   Left Eye Near:    Bilateral Near:     Physical Exam Vitals and nursing note reviewed.  Constitutional:      General: She is not in acute distress.    Appearance: She is ill-appearing.  HENT:     Right Ear: Tympanic membrane normal.     Left Ear: Tympanic membrane normal.     Nose: Congestion present. No rhinorrhea.     Mouth/Throat:     Pharynx: No posterior oropharyngeal erythema.  Cardiovascular:     Rate and Rhythm: Normal rate and regular rhythm.     Pulses: Normal pulses.     Heart sounds: Normal heart sounds.  Pulmonary:     Effort: Pulmonary effort is normal.     Breath sounds: Normal breath sounds. No wheezing, rhonchi or rales.  Abdominal:     General: Bowel sounds are normal.     Palpations: Abdomen is soft.  Neurological:     Mental Status: She is alert.      UC Treatments / Results  Labs (all labs ordered are listed, but only abnormal results are displayed) Labs Reviewed - No data to display  EKG   Radiology No results found.  Procedures Procedures (including critical care  time)  Medications Ordered in UC Medications - No data to display  Initial Impression / Assessment and Plan / UC Course  I have reviewed the triage vital signs and the nursing notes.  Pertinent labs & imaging results that were available during my care of the patient were reviewed by me and considered in my medical decision making (see chart for details).     1.  Acute viral laryngitis secondary to COVID-19 infection: Albuterol inhaler as needed for chest tightness Tussionex twice daily as needed for cough Ibuprofen or Tylenol as needed for pain and/or fever Paxlovid-normal dosing-GFR is greater than 60 Return precautions given Patient is advised to maintain adequate hydration. Final Clinical Impressions(s) / UC Diagnoses   Final diagnoses:  Acute viral laryngitis  COVID-19 virus infection     Discharge Instructions      Humidifier use will help with nasal congestion, coughing and hoarseness of your voice Please take medications as directed Maintain adequate hydration Your lung exam is reassuring There is no indication for chest x-ray at this time You may take Tylenol or ibuprofen as needed for pain and/or fever Please feel free to return to urgent care if you have worsening symptoms.   ED Prescriptions     Medication Sig Dispense Auth. Provider   chlorpheniramine-HYDROcodone (TUSSIONEX) 10-8 MG/5ML Take 5 mLs by mouth 2 (two) times daily. 115 mL Angelissa Supan, Britta Mccreedy, MD   nirmatrelvir/ritonavir (PAXLOVID, 300/100,) 20 x 150 MG & 10 x 100MG  TBPK Take 3 tablets by mouth 2 (two) times daily for 5 days. Patient GFR is greater than 60. Take nirmatrelvir (150 mg) two tablets twice daily for 5 days and ritonavir (100 mg) one tablet twice daily for 5 days. 30 tablet Demaris Callander  O, MD      I have reviewed the PDMP during this encounter.   Merrilee Jansky, MD 10/09/23 1010

## 2023-10-09 NOTE — ED Triage Notes (Signed)
Patient here today with c/o hoarseness, ST, SOB, and headache since Friday. She went to [facility] on Friday with no relief. She has been taking Tylenol, IBU, Alka Seltzer Plus, Tessalon Pearls, and drinking hot tea.

## 2024-05-15 NOTE — Progress Notes (Signed)
 Physical Therapy Visit - Daily Note   Payor: BCBS / Plan: BCBS OOS PPO / Product Type: PPO /   Visit Count: 3  Rehabilitation Precautions/Restrictions:   Precautions/Restrictions Precautions: RA, HTN, migraine, osteoporosis, celiac dx        Referring Diagnosis: Diagnosis  M54.2 (ICD-10-CM) - Cervical spine pain  M62.838 (ICD-10-CM) - Muscle spasms of neck   SUBJECTIVE Patient Report:   Patient reports she received a steroid injection in her R shoulder 2 weeks ago.  Her neck is still tight overall.  Her headaches haven't been as bad.    Change in Status Since Last Visit: No Pain:  Pain Assessment Pain Assessment: 0-10 Pain Score  : 3 Pain Type: Acute pain Pain Location: Neck Pain Orientation: Right, Left Pain Descriptors: Tightness Response to Interventions: therapy to tolerance  OBJECTIVE  General Observation/Objective Measures: Patient arrives to clinic independently and in NAD.                Interventions:  Therapeutic Exercise: Seated UT stretch 3x30 seconds each side Standing reverse shoulder rolls 3x10 Seated scapular retractions 3x10 hold 3 seconds Seated scapular W's with lime green TB 3x10 hold 3 seconds Standing cervical sidebending AROM with bilateral 4# db hold x 2 min Standing cervical rotation AROM with bilateral 4# db hold x 2 min COS rows 10# band x 2 min COS extensions 7# band x 2 min    Education: Yes, as described in interventions   ASSESSMENT Patient presented to therapy with gradually improving ROM, strength, and function.  Treatment focused on gentle cervical mobility, cervical stretching, postural correction, and postural strengthening. Patient demonstrated an improved response to today's session.  She was able to tolerate more exercises with no adverse symptoms.  Patient was challenged and required cues for optimal scapular retraction vs. Upper trap compensations with postural strengthening.  Patient will continue to benefit from  skilled PT in order to address pain, muscular tightness, ROM, strength, and function.       Therapy Diagnosis:     ICD-10-CM   1. Pain, neck  M54.2   2. Muscle tension headache  G44.209   3. Impaired mobility and ADLs  Z74.09, Z78.9   4. Decreased range of motion of neck  R29.898     Progress Towards Goals:   Progressing  Goals Addressed             This Visit's Progress   . PT Goal   On track    Short Term Goals (STG) - Time Frame 4 weeks             STG 1: Pt will be independent and compliant with HEP.               STG 2: Pt will improve cervical rotation ROM to [facility] in order to enable pt to return to driving safely.               STG 3: Pt will report <4/10 pain in order to improve activity tolerance and participation in ADLs.   Long Term Goals (LTG) - Time Frame 8 weeks             LTG 1: Pt will score <20% on NDI in order to demonstrate minimal disability with functional mobility.               LTG 2: Pt will report <2/10 pain in order to improve activity tolerance and participation in ADLs.  LTG 3: Pt will improve cervical flexion ROM to [facility] in order to enable pt to return to work without pain and limitations.               LTG 4: Pt will be independent and compliant with final HEP in order to demonstrate discharge readiness.            PLAN Treatment Frequency and Duration:    Recommended PT Treatment/Interventions: No data recorded  Recommended Consults:  None currently  Development of Plan of Care:  No change in POC.  Total Treatment Time (Time & Untimed): Total Treatment Time: 38 Total Time in Timed Codes: Time in Timed Codes: 38     Treatment/Procedures Therapeutic Exercises minutes: 38           This therapy session was performed under the supervision of Danielle Oxendine, PT    The patient has been instructed to contact our clinic if any questions or problems should arise.

## 2024-05-16 NOTE — Progress Notes (Signed)
 [facility]  - Family Medicine [name]  Date of Service: 05/20/2024 Patient Name: Nicole Gilbert Patient DOB: August 16, 1961    Subjective:   Annual Exam  HPI Patient comes in for Annual Exam  CPE: [name] presents today for complete physical exam. Patient voices complaints Yes patient complains about a sharp pain beneath right breast that comes off and on.    The patient is Divorced and has 2 children. General health since the last visit is described as having some anxiety about her car messing up. No LMP recorded. Patient has had a hysterectomy. Date of last pap smear:  utd.   History abnormal pap smear:  No Date of last mammogram: 06/21/23 Colorectal cancer screening: Yes, 05/20/2021, thinks 3 or 5 year follow up, will get date  Dental visit within the last year: Yes Eye exam within the last year:  Yes Hearing: normal. Current diet: No Exercise:  Yes, walking and line dancing  Weight concerns: Yes  Sexually active: No. Urinary incontinence: No. Last TDAP:Yes - 03/02/2020.  Immunization History  Administered Date(s) Administered  . Influenza, Injectable, Quadrivalent, Preservative Free 10/03/2022  . Influenza, Unspecified 09/24/2020  . Moderna SARS-CoV-2 Primary Series 12+ yrs 12/10/2019, 01/07/2020, 02/05/2020  . PPD Test 01/31/2023  . Pfizer SARS-CoV-2 Primary Series 12+ yrs 12/03/2020  . TDAP VACCINE (BOOSTRIX,ADACEL) 7Y+ 03/02/2020  . Varicella Zoster [facility]) 18Y+ 05/06/2021, 05/10/2022    SAFETY ELEMENTS: Seatbelts yes. Texting while driving no.  Guns/weapons secure yes. Smoke detectors in home yes. Carbon monoxide detectors in [employer] . Grab bars in bathroom yes.    Living will and DURABLE POWER OF ATTORNEY for healthcare directives:  No  Over the last 2 weeks how often have you had little interest or little pleasure in doing things: Several days  Over the last 2 weeks how often have you been bothered by feeling down,  depressed, or hopeless:  Several days  PHQ/GAD Scores       05/20/2024 0939         Patient Health Questionnaire-9 Score: 9     GAD-7 Total Score: 7 (03/19/2024 12:59 PM)  She doesn't want to take med, advised to try the cymbalta again. Use hydroxyzine prn. Doesn't want to see counselor.  Review of Systems Pertinent ROS items are noted in HPI.  Constitutional symptoms: negative, goes to exercise.  Eyes: has been to see eye dr . Has cataracts Ear, nose, throat:  negative Cardiovascular:  negative Respiratory:  negative, uses albuterol  prn when in yard Gastrointestinal:  due in next year for colonoscopy.no change.   Genitourinary:  hx of hyst.  Skin:  negative Neurological:  negative Musculoskeletal: has RA. On meds. On prolia. No falls. No  hx of fracture. Goes to PT for neck, it is doing better, less headaches, is doing her exercises. She may not go back due to cost, Rheumatology orders her Dexa scan. She is on prolia Psychiatric: can't stay focused. Doesn't sleep well. Poor experience as child. Doesn't like to take meds. Family not very supportive,  Endocrine:  negative Hematological:  negative Allergic:  negative   The following portions of the patient's history were reviewed and updated as appropriate: allergies, current medications, PMH/PSH, past social history and problem list.   Past Medical/Surgical History:   Medical History[1] Surgical History[2]  Family History:   Family History[3]  Social History:   Social History[4] Tobacco Use History[5]   Allergies:   Morphine  and Shellfish containing products  Current Medications:  Current Medications[6]   Objective:   Vital Signs BP 134/90 (BP Location: Left arm, Patient Position: Sitting)   Pulse 65   Temp 98 F (36.7 C) (Oral)   Ht 1.549 m (5' 1)   Wt 71.2 kg (157 lb)   SpO2 98%   BMI 29.66 kg/m   BP Readings from Last 3 Encounters:  05/20/24 134/90  03/19/24 145/87  03/19/24 136/89   Wt  Readings from Last 3 Encounters:  05/20/24 71.2 kg (157 lb)  03/19/24 71.4 kg (157 lb 6.4 oz)  03/19/24 71.8 kg (158 lb 6 oz)   No LMP recorded. Patient has had a hysterectomy.  Physical Exam  Constitutional:  Well developed, well nourished, no acute distress, VSS, weight stable Eyes:  PERRL, conjunctiva normal , oem's intact bil HENT:  Atraumatic, external ears normal, hearing intact, throat normal, TM's: clear,  nares clear.  Neck: supple, trachea ML, no cervical LAD, no carotid bruits Respiratory: no respiratory distress, lungs CTA bilaterally Breasts: No breast masses, no nodules, no axillary nodes bil Cardiovascular: normal rate,  normal rhythm, no murmurs; no LE edema Gastrointestinal: soft, nontender. BS+, no masses  Musculoskeletal:  FROM normal, strength 2+, no deformities Lymph: There is no anterior or posterior cervical , axillary or supraclavicular bil adenopathy.  Neurologic:  Alert and oriented 3.  Gait is normal. CN 2-12 intact bil, OEM's intact x 6 fields of gaze. Coordination normal. DTR's 2+ bil knees, elbows Romberg negative, tandem walk normal Psychiatric:  Speech and behavior appropriate, occ teary.  Skin: warm & dry, no rashes  Assessment/Plan:    Lusine was seen today for annual exam.  Diagnoses and all orders for this visit:  Well adult exam -     CBC without Differential -     Comprehensive Metabolic Panel -     Lipid Panel  Breast cancer screening by mammogram -     [facility] Breast Screening Tomo Bilat; Future  Cervical spine pain Comments: finishing PT. doing exercises, can tell it is helping.  Anxiety and depression Comments: urged to restart cymbalta, consider counseling. cont to exercise, do home projects, suggested journal writing  Family history of colon cancer Comments: needs to stay up to date with colon cancer screening  Rheumatoid arthritis of other site with positive rheumatoid factor    (CMD)    -Eat healthy foods. Choose fruits,  vegetables, whole grains, protein, and low-fat dairy foods. Limit fat, especially saturated fat. Reduce salt in your diet. -Get at least 30 minutes of physical activity on most days of the week -Labs ordered below -Follow up in 1 year for CPE. And AWV welcome to medicare    Patient verbalizes understanding and in agreement with the above plan. All questions answered.    Medication side effects discussed with patient. Advised patient to call clinic or return for visit if these symptoms occur.   Goals of care discussed with patient including med compliance and adequate follow up.  Return in about 6 months (around 11/19/2024) for htn followup.  Call prn.    This document serves as a record of services personally performed by Santana Molt, FN.  It was created on their behalf by Seldon GORMAN Ann, CMA, a trained medical scribe, and Certified Medical Assistant (CMA). During the course of documenting the history, physical exam and medical decision making, I was functioning as a stage manager. The creation of this record is the provider's dictation and/or activities during the visit.  Electronically signed by Seldon GORMAN Ann, CMA  05/16/2024 3:20 PM    This document was created using the aid of voice recognition Dragon dictation software.   Santana Tarry Molt, FNP       [1] Past Medical History: Diagnosis Date  . Cataract   . Degenerative disc disease, lumbar 03/05/2019  . OA (osteoarthritis) of knee 01/15/2009   Overview:  Qualifier: Diagnosis of  By: Margrette MD, Taft   . Sciatica of left side   . Syncope   [2] Past Surgical History: Procedure Laterality Date  . CARPAL TUNNEL RELEASE Right    Procedure: CARPAL TUNNEL RELEASE  . CESAREAN SECTION, UNSPECIFIED     Procedure: CESAREAN SECTION; x 2  . COLONOSCOPY W/ POLYPECTOMY     Procedure: COLONOSCOPY W/ POLYPECTOMY  . HYSTERECTOMY      Procedure: HYSTERECTOMY  . KNEE ARTHROSCOPY Right    Procedure: KNEE ARTHROSCOPY  .  OOPHORECTOMY     Procedure: OOPHORECTOMY  . TOTAL VAGINAL HYSTERECTOMY    [3] Family History Problem Relation Name Age of Onset  . Depression Mother    . Hypertension Mother    . Anxiety disorder Mother    . Migraines Mother    . Hypertension Father    . Diabetes Father    . Cataracts Father    . Diabetes Sister    . Hypertension Sister    . Cataracts Sister    . Diabetes Brother    . Hypertension Brother    . Heart disease Maternal Grandmother    . Hypertension Maternal Grandmother    . Hypertension Maternal Grandfather    . Glaucoma Neg Hx    . Macular degeneration Neg Hx    . Retinal detachment Neg Hx    . Blindness Neg Hx    . Breast cancer Neg Hx    . Ovarian cancer Neg Hx    [4] Social History Socioeconomic History  . Marital status: Divorced  . Years of education: 42  Tobacco Use  . Smoking status: Never  . Smokeless tobacco: Never  Vaping Use  . Vaping status: Never Used  Substance and Sexual Activity  . Alcohol use: Yes  . Drug use: Never   Social Drivers of Health   Food Insecurity: Low Risk  (10/02/2023)   Food vital sign   . Within the past 12 months, you worried that your food would run out before you got money to buy more: Never true   . Within the past 12 months, the food you bought just didn't last and you didn't have money to get more: Never true  Transportation Needs: No Transportation Needs (10/02/2023)   Transportation   . In the past 12 months, has lack of reliable transportation kept you from medical appointments, meetings, work or from getting things needed for daily living? : No  Safety: Not At Risk (05/10/2022)   Received from [facility] visits prior to 02/04/2023.   Safety   . Within the last year, have you been afraid of your partner or ex-partner?: No   . Within the last year, have you been humiliated or emotionally abused in other ways by your partner or ex-partner?: No   . Within the last year, have you been  kicked, hit, slapped, or otherwise physically hurt by your partner or ex-partner?: No   . Within the last year, have you been raped or forced to have any kind of sexual activity by your partner or ex-partner?: No  Living Situation: Low Risk  (10/02/2023)  Living Situation   . What is your living situation today?: I have a steady place to live   . Think about the place you live. Do you have problems with any of the following? Choose all that apply:: None/None on this list  [5] Social History Tobacco Use  Smoking Status Never  Smokeless Tobacco Never  [6] Current Outpatient Medications  Medication Sig Dispense Refill  . acetaminophen  (TYLENOL ) 500 mg tablet Take 1,000 mg by mouth.    . albuterol  HFA (PROVENTIL  HFA;VENTOLIN  HFA;PROAIR  HFA) 90 mcg/actuation inhaler INHALE 2 PUFFS INTO THE LUNGS EVERY 6 HOURS AS NEEDED FOR WHEEZE 8.5 each 0  . amitriptyline (ELAVIL) 10 mg tablet TAKE 1 TABLET BY MOUTH NIGHTLY AS NEEDED FOR SLEEP 90 tablet 0  . Bacillus subtilis-inulin (Culturelle Gummy) 1.5 billion cell-1 gram chew 2 chews daily    . certolizumab pegoL (Cimzia) 400 mg/2 mL (200 mg/mL x 2) sykt injection Inject under the skin.    SABRA denosumab (Prolia) 60 mg/mL syrg syringe Inject 60 mg under the skin every 6 (six) months.    . DULoxetine (CYMBALTA) 30 mg capsule Take 1 capsule (30 mg total) by mouth daily. 30 capsule 3  . fluticasone propionate (FLONASE) 50 mcg/spray nasal spray 1 spray Once Daily. 1 Inhaler 3  . hydroCHLOROthiazide (HYDRODIURIL) 12.5 mg capsule TAKE 1 CAPSULE BY MOUTH EVERY DAY 90 capsule 1  . hydrOXYzine (ATARAX) 10 mg tablet TAKE 1 TABLET BY MOUTH EVERY 8 HOURS AS NEEDED FOR ANXIETY. 90 tablet 1  . methocarbamoL  (ROBAXIN ) 500 mg tablet TAKE 1 TABLET BY MOUTH 3 TIMES A DAY AS NEEDED FOR MUSCLE SPASMS. 60 tablet 1  . multivitamin (THERAGRAN) tab tablet Take 1 tablet by mouth Once Daily.    . predniSONE  (DELTASONE ) 5 mg tablet Take 5 mg by mouth See Admin Instructions. PLEASE SEE  ATTACHED FOR DETAILED DIRECTIONS (Patient not taking: Reported on 05/20/2024)     No current facility-administered medications for this visit.

## 2024-09-09 NOTE — Progress Notes (Signed)
 Patient presented to the office for lab work. Labs were collected at today's visit and sent to the lab for testing.

## 2024-09-10 NOTE — Telephone Encounter (Signed)
 Pt in for labs. Went on trip and returned last week with cold/ cough symptoms. Covid test neg. No fever. Just coughing, sinus drainage, sneezing. Not sleeping well due to cough.  Had tussionex last year. Tol well. Will refill to use at HS, use mucinex daytime. Call if not improving. Can take claritin for allergy sx as well.

## 2024-11-11 ENCOUNTER — Encounter (HOSPITAL_COMMUNITY): Payer: Self-pay

## 2024-11-11 ENCOUNTER — Ambulatory Visit (HOSPITAL_COMMUNITY)

## 2024-11-11 ENCOUNTER — Ambulatory Visit (HOSPITAL_COMMUNITY): Admission: EM | Admit: 2024-11-11 | Discharge: 2024-11-11 | Disposition: A | Discharge status: Home / Self Care

## 2024-11-11 DIAGNOSIS — R350 Frequency of micturition: Secondary | ICD-10-CM | POA: Diagnosis not present

## 2024-11-11 DIAGNOSIS — M5442 Lumbago with sciatica, left side: Secondary | ICD-10-CM | POA: Diagnosis not present

## 2024-11-11 DIAGNOSIS — M5441 Lumbago with sciatica, right side: Secondary | ICD-10-CM | POA: Diagnosis not present

## 2024-11-11 LAB — POCT URINE DIPSTICK
Bilirubin, UA: NEGATIVE
Blood, UA: NEGATIVE
Glucose, UA: NEGATIVE mg/dL
Ketones, POC UA: NEGATIVE mg/dL
Nitrite, UA: NEGATIVE
Protein Ur, POC: NEGATIVE mg/dL
Spec Grav, UA: 1.015 (ref 1.010–1.025)
Urobilinogen, UA: 0.2 U/dL
pH, UA: 7 (ref 5.0–8.0)

## 2024-11-11 MED ORDER — PHENAZOPYRIDINE HCL 200 MG PO TABS: 200.0000 mg | ORAL_TABLET | Freq: Three times a day (TID) | ORAL | 0 refills | Status: AC | PRN

## 2024-11-11 MED ORDER — NAPROXEN 500 MG PO TABS: 500.0000 mg | ORAL_TABLET | Freq: Two times a day (BID) | ORAL | 0 refills | Status: AC

## 2024-11-11 MED ORDER — CYCLOBENZAPRINE HCL 10 MG PO TABS: 10.0000 mg | ORAL_TABLET | Freq: Two times a day (BID) | ORAL | 0 refills | Status: AC | PRN

## 2024-11-11 MED ORDER — KETOROLAC TROMETHAMINE 30 MG/ML IJ SOLN
30.0000 mg | Freq: Once | INTRAMUSCULAR | Status: AC
  Administered 2024-11-11: 30 mg via INTRAMUSCULAR

## 2024-11-11 MED ORDER — KETOROLAC TROMETHAMINE 30 MG/ML IJ SOLN
INTRAMUSCULAR | Status: AC
  Filled 2024-11-11: qty 1

## 2024-11-11 NOTE — Discharge Instructions (Addendum)
 Urinalysis is negative for infection. Your lumbar x-ray shows an L3 compression deformity.  You have been given a prescription for phenazopyridine .  Take 1 tablet every 8 hours as needed for bladder spasms/pain. Increase your fluid intake to maintain hydration.  You have been given a prescription for cyclobenzaprine .  Take 1 tablet by mouth twice daily as needed for muscle spasms.  This medication may make you drowsy, therefore please do not drive or operate machinery while taking.  You have been given a prescription for Naprosyn .  Take 1 tablet by mouth twice daily.  Do not take additional NSAIDs including aspirin, Advil /ibuprofen , Aleve /naproxen  while taking this medication due to increased risk of GI bleed.  As discussed, your blood pressure is elevated today.  Please take your antihypertensive medication as soon as possible.  Please follow-up with your primary care, rheumatologist for L3 compression deformity.  If you develop any new or worsening symptoms or if your symptoms do not start to improve, please return here or follow-up with your primary care provider.  If your symptoms are severe, please go to the emergency room.

## 2024-11-11 NOTE — ED Provider Notes (Signed)
 [facility]    CSN: [id number] Arrival date & time: 11/11/24  9187      History   Chief Complaint Chief Complaint  Patient presents with   Back Pain    HPI Nicole Gilbert is a 63 y.o. female.   This 63 year old female is being seen for concerns of low back pain and urinary frequency.  She reports for approximately 2-3 weeks she has had low back pain radiating to bilateral hips.  She was getting a tote from her attic when she noticed the pain.  She reports burning sensation midline and constant pain radiating out to bilateral hips.  She reports pain is worse with if coughing, sneezing.  She also reports pelvic pain along with urinary frequency for approximately 1 week.  She has taken Tylenol , ibuprofen  without significant relief of symptoms.  She has an appointment scheduled with her doctor on 11/18/2024.  She denies fever, chills.  She denies numbness or tingling in extremities.  She denies chest pain, shortness of breath.  She denies abdominal pain, nausea, vomiting, constipation, diarrhea.   Back Pain Associated symptoms: pelvic pain   Associated symptoms: no abdominal pain, no chest pain, no dysuria, no fever, no numbness and no weakness     Past Medical History:  Diagnosis Date   Anxiety    Chronic constipation    GERD (gastroesophageal reflux disease)    Hemorrhoids 2006   Hyperlipemia    Insomnia    Osteoporosis    RA (rheumatoid arthritis) (HCC)     Patient Active Problem List   Diagnosis Date Noted   Small bowel obstruction (HCC) 01/30/2022   Abnormal CT of the abdomen 03/26/2021   History of colonic polyps 03/26/2021   Abnormal finding on GI tract imaging    Abdominal pain 03/04/2021   Enterocolitis 03/04/2021   HLD (hyperlipidemia) 03/04/2021   Low iron 04/21/2017   RLQ abdominal pain 11/22/2016   Abdominal pain, epigastric 11/22/2016   Microcytosis 11/22/2016   Dyspepsia 10/29/2014   Primary osteoarthritis of right knee 10/09/2014   Abdominal  pain 08/21/2013   Dizziness 07/14/2013   Abdominal pain, left lower quadrant 01/21/2013   OA (osteoarthritis) of knee 10/04/2012   Synovitis of knee 10/04/2012   Family history of colon cancer 08/03/2012   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 Rourk:malignant features are not identified   Carpal tunnel right hand     CESAREAN SECTION     x2   COLONOSCOPY   05/19/2008   Rourk-Prominent internal hemorrhoids and anal papilla, otherwise normal appearing rectal mucosa / Colonic mucosa appeared normal except for melanosis coli   COLONOSCOPY  08/22/2012   Rourk- hyperplastic polyps and tubular adenoma, query infectious colitis vs NSAID effect. Next TCS 08/2017.   ESOPHAGOGASTRODUODENOSCOPY   01/20/2005   Rourk- Normal-appearing esophagus, status post passage of a 23 French Maloney  dilator/ small HH/  Otherwise normal stomach   ESOPHAGOGASTRODUODENOSCOPY N/A  02/06/2013   MFM:Wnmfjo esophagus. Small hiatal hernia. Antral erosions: bx no h.pylori.   ESOPHAGOGASTRODUODENOSCOPY N/A 06/25/2014   Dr. Godfrey nodule-status post biopsy; otherwise, normalEGD. No endoscopic explanation for patient's symptoms   HEMORRHOID SURGERY  06/30/2008   Dr Mavis   KNEE ARTHROSCOPY     2010, RIGHT KNEE     OB History   No obstetric history on file.      Home Medications    Prior to Admission medications   Medication Sig Start Date End Date  Taking? Authorizing Provider  cyclobenzaprine  (FLEXERIL ) 10 MG tablet Take 1 tablet (10 mg total) by mouth 2 (two) times daily as needed for muscle spasms. 11/11/24  Yes Dianne Whelchel C, FNP  naproxen  (NAPROSYN ) 500 MG tablet Take 1 tablet (500 mg total) by mouth 2 (two) times daily. 11/11/24  Yes Nickson Middlesworth C, FNP  phenazopyridine  (PYRIDIUM ) 200 MG tablet Take 1 tablet (200 mg total) by mouth every 8 (eight) hours as needed for pain. 11/11/24  Yes Yulitza Shorts C, FNP  acetaminophen  (TYLENOL ) 500 MG tablet Take 1,000 mg by mouth every 6 (six) hours as needed for headache.    [provider]  albuterol  (VENTOLIN  HFA) 108 (90 Base) MCG/ACT inhaler Inhale 1 puff into the lungs every 6 (six) hours as needed for wheezing or shortness of breath. 10/09/23   LampteyAleene KIDD, MD  amitriptyline (ELAVIL) 10 MG tablet Take 10 mg by mouth at bedtime. 08/01/22   [provider]  certolizumab pegol (CIMZIA) 200 MG/ML prefilled syringe Inject into the skin.    [provider]  cholecalciferol (VITAMIN D3) 25 MCG (1000 UNIT) tablet Take 2,000 Units by mouth daily.    [provider]  denosumab (PROLIA) 60 MG/ML SOSY injection Inject into the skin.    [provider]  ELDERBERRY PO Take 2 tablets by mouth daily.    [provider]  fluticasone (FLONASE) 50 MCG/ACT nasal spray 1 spray Once Daily. 03/02/20   [provider]  hydrochlorothiazide (MICROZIDE) 12.5 MG capsule Take 12.5 mg by mouth daily. 07/21/22   [provider]  psyllium (METAMUCIL) 58.6 % powder Take 1 packet by mouth daily.    [provider]    Family History Family History  Problem Relation Age of Onset   Diabetes Father    Colon cancer Brother 22       (paternal half brother)   Coronary artery disease Maternal Grandmother    Lung cancer Other     Social History Social History   Tobacco Use   Smoking status: Never   Smokeless tobacco: Never  Vaping Use    Vaping status: Never Used  Substance Use Topics   Alcohol use: Yes    Comment: rare   Drug use: No     Allergies   Morphine  and codeine and Shellfish allergy   Review of Systems Review of Systems  Constitutional:  Positive for activity change. Negative for chills and fever.  Respiratory:  Negative for shortness of breath.   Cardiovascular:  Negative for chest pain.  Gastrointestinal:  Negative for abdominal pain, constipation, diarrhea, nausea and vomiting.  Genitourinary:  Positive for frequency and pelvic pain. Negative for difficulty urinating, dysuria, flank pain, hematuria, vaginal bleeding, vaginal discharge and vaginal pain.  Musculoskeletal:  Positive for back pain. Negative for arthralgias.  Skin:  Negative for color change and rash.  Neurological:  Negative for dizziness, weakness and numbness.  All other systems reviewed and are negative.  Physical Exam Triage Vital Signs ED Triage Vitals  Encounter Vitals Group     BP 11/11/24 0904 (!) 162/103     Girls Systolic BP Percentile --      Girls Diastolic BP Percentile --      Boys Systolic BP Percentile --      Boys Diastolic BP Percentile --      Pulse Rate 11/11/24 0904 74     Resp 11/11/24 0904 18     Temp 11/11/24 0904 98 F (36.7 C)     Temp Source 11/11/24 0904 Oral     SpO2 11/11/24 0904 98 %     Weight --      Height --      Head Circumference --      Peak Flow --      Pain Score 11/11/24 0901 8     Pain Loc --      Pain Education --      Exclude from Growth Chart --    No data found.  Updated Vital Signs BP (!) 177/104 (BP Location: Right Arm)   Pulse 72   Temp 98 F (36.7 C) (Oral)   Resp 18   SpO2 99%   Visual Acuity Right Eye Distance:   Left Eye Distance:   Bilateral Distance:    Right Eye Near:   Left Eye Near:    Bilateral Near:     Physical Exam Vitals and nursing note reviewed.  Constitutional:      General: She is not in acute distress.    Appearance: She is  well-developed. She is not ill-appearing or toxic-appearing.     Comments: Pleasant female appearing stated age found sitting in chair in no acute distress.  HENT:     Head: Normocephalic and atraumatic.     Right Ear: External ear normal.     Left Ear: External ear normal.     Nose: Nose normal.     Mouth/Throat:     Lips: Pink.  Eyes:     Conjunctiva/sclera: Conjunctivae normal.  Cardiovascular:     Rate and Rhythm: Normal rate and regular rhythm.     Heart sounds: Normal heart sounds. No murmur heard. Pulmonary:     Effort: Pulmonary effort is normal. No respiratory distress.     Breath sounds: Normal breath sounds.  Abdominal:     General: Bowel sounds are normal.     Palpations: Abdomen is soft.     Tenderness: There is abdominal tenderness in the suprapubic area.  Musculoskeletal:        General: No swelling.     Cervical back: Normal and neck supple. No spinous process tenderness or muscular tenderness.     Thoracic back: Normal.     Lumbar back: Tenderness and bony tenderness present. Negative right straight leg raise test and negative left straight leg raise test.  Skin:    General: Skin is warm and dry.  Neurological:     Mental Status: She is alert and oriented to person, place, and time.     Motor: Motor function is intact.  Psychiatric:        Mood and Affect: Mood normal.      UC Treatments / Results  Labs (all labs ordered are listed, but only abnormal results are displayed) Labs Reviewed  POCT URINE DIPSTICK - Abnormal; Notable for the following components:      Result Value   Leukocytes, UA Trace (*)    All other components within normal  limits    EKG   Radiology DG Lumbar Spine Complete Result Date: 11/11/2024 EXAM: 4 VIEW(S) XRAY OF THE LUMBAR SPINE 11/11/2024 09:57:33 AM COMPARISON: CT dated 09/30/2017. CLINICAL HISTORY: pain pain FINDINGS: LUMBAR SPINE: BONES: Minimal convex to the left lumbar spine curvature. Superior endplate irregularity at  L3 without significant vertebral body height loss. This is new since 2018 CT. DISCS AND DEGENERATIVE CHANGES: Mild for age spondylosis, with facet arthropathy at L4-L5 and L5-S1. SOFT TISSUES: No acute abnormality. IMPRESSION: 1. Mild superior endplate irregularity at L3, new since 2018 CT, suspicious for mild compression deformity. Electronically signed by: Rockey Kilts MD 11/11/2024 10:50 AM EST RP Workstation: [id number]    Procedures Procedures (including critical care time)  Medications Ordered in UC Medications  ketorolac  (TORADOL ) 30 MG/ML injection 30 mg (30 mg Intramuscular Given 11/11/24 1100)    Initial Impression / Assessment and Plan / UC Course  I have reviewed the triage vital signs and the nursing notes.  Pertinent labs & imaging results that were available during my care of the patient were reviewed by me and considered in my medical decision making (see chart for details).     Vitals and triage reviewed.  Urinalysis is negative for infection.  Lumbar x-ray significant for mild compression deformity at L3.  Initial blood pressure elevated 162/103.  Repeat blood pressure is 177/104.  She takes antihypertensive medications and reports compliance with these.  She says she has not taken medication this morning, but has it in the car.  She is advised to take medication when discharged.  She received Toradol  injection in clinic today.  She is given a prescription for cyclobenzaprine  and Naprosyn .  She has provided prescription for Pyridium  for bladder spasms.  She is advised to increase fluid intake.  She is provided a work note.  She currently sees rheumatology for osteoporosis.  Insurance has denied her Prolia injection.  She is advised to follow-up with rheumatologist to reassess for Prolia refill.  Plan of care, follow-up care, return precautions given, no questions at this time. Final Clinical Impressions(s) / UC Diagnoses   Final diagnoses:  Acute bilateral low back pain with  bilateral sciatica  Urinary frequency     Discharge Instructions      Urinalysis is negative for infection. Your lumbar x-ray shows an L3 compression deformity.  You have been given a prescription for phenazopyridine .  Take 1 tablet every 8 hours as needed for bladder spasms/pain. Increase your fluid intake to maintain hydration.  You have been given a prescription for cyclobenzaprine .  Take 1 tablet by mouth twice daily as needed for muscle spasms.  This medication may make you drowsy, therefore please do not drive or operate machinery while taking.  You have been given a prescription for Naprosyn .  Take 1 tablet by mouth twice daily.  Do not take additional NSAIDs including aspirin, Advil /ibuprofen , Aleve /naproxen  while taking this medication due to increased risk of GI bleed.  As discussed, your blood pressure is elevated today.  Please take your antihypertensive medication as soon as possible.  Please follow-up with your primary care, rheumatologist for L3 compression deformity.  If you develop any new or worsening symptoms or if your symptoms do not start to improve, please return here or follow-up with your primary care provider.  If your symptoms are severe, please go to the emergency room.     ED Prescriptions     Medication Sig Dispense Auth. Provider   cyclobenzaprine  (FLEXERIL ) 10 MG tablet Take 1 tablet (  10 mg total) by mouth 2 (two) times daily as needed for muscle spasms. 20 tablet Allina Riches C, FNP   naproxen  (NAPROSYN ) 500 MG tablet Take 1 tablet (500 mg total) by mouth 2 (two) times daily. 30 tablet Million Maharaj C, FNP   phenazopyridine  (PYRIDIUM ) 200 MG tablet Take 1 tablet (200 mg total) by mouth every 8 (eight) hours as needed for pain. 6 tablet Cire Deyarmin C, FNP      I have reviewed the PDMP during this encounter.   Lennice Jon BROCKS, FNP 11/11/24 [phone]

## 2024-11-11 NOTE — ED Triage Notes (Signed)
 Pt c/o lower back  radiating around to abdomen x2 wks. Denies injury. States PCP appt on 12/15. States now having cramps in legs at night while laying down. States took tylenol , aleve , and ibuprofen  with no relief.

## 2024-11-22 ENCOUNTER — Other Ambulatory Visit: Payer: Self-pay | Admitting: Chiropractic Medicine

## 2024-11-22 DIAGNOSIS — S32000A Wedge compression fracture of unspecified lumbar vertebra, initial encounter for closed fracture: Secondary | ICD-10-CM
